# Patient Record
Sex: Female | Born: 1980 | Race: White | Hispanic: No | Marital: Married | Smoking: Never smoker
Health system: Southern US, Community
[De-identification: ages and names within clinical notes are randomized; demographics above are authoritative.]

## PROBLEM LIST (undated history)

## (undated) ENCOUNTER — Emergency Department (HOSPITAL_COMMUNITY): Admission: EM | Payer: Self-pay | Source: Home / Self Care

## (undated) DIAGNOSIS — M1990 Unspecified osteoarthritis, unspecified site: Secondary | ICD-10-CM

## (undated) DIAGNOSIS — Z98811 Dental restoration status: Secondary | ICD-10-CM

## (undated) DIAGNOSIS — G932 Benign intracranial hypertension: Secondary | ICD-10-CM

## (undated) DIAGNOSIS — C50919 Malignant neoplasm of unspecified site of unspecified female breast: Secondary | ICD-10-CM

## (undated) DIAGNOSIS — Z801 Family history of malignant neoplasm of trachea, bronchus and lung: Secondary | ICD-10-CM

## (undated) DIAGNOSIS — J45909 Unspecified asthma, uncomplicated: Secondary | ICD-10-CM

## (undated) DIAGNOSIS — R002 Palpitations: Secondary | ICD-10-CM

## (undated) DIAGNOSIS — Z8051 Family history of malignant neoplasm of kidney: Secondary | ICD-10-CM

## (undated) DIAGNOSIS — R0981 Nasal congestion: Secondary | ICD-10-CM

## (undated) DIAGNOSIS — K219 Gastro-esophageal reflux disease without esophagitis: Secondary | ICD-10-CM

## (undated) DIAGNOSIS — E669 Obesity, unspecified: Secondary | ICD-10-CM

## (undated) DIAGNOSIS — Z8709 Personal history of other diseases of the respiratory system: Secondary | ICD-10-CM

## (undated) DIAGNOSIS — J302 Other seasonal allergic rhinitis: Secondary | ICD-10-CM

## (undated) DIAGNOSIS — F419 Anxiety disorder, unspecified: Secondary | ICD-10-CM

## (undated) DIAGNOSIS — G44009 Cluster headache syndrome, unspecified, not intractable: Secondary | ICD-10-CM

## (undated) DIAGNOSIS — Z923 Personal history of irradiation: Secondary | ICD-10-CM

## (undated) DIAGNOSIS — Z8052 Family history of malignant neoplasm of bladder: Secondary | ICD-10-CM

## (undated) DIAGNOSIS — R05 Cough: Secondary | ICD-10-CM

## (undated) DIAGNOSIS — Z800 Family history of malignant neoplasm of digestive organs: Secondary | ICD-10-CM

## (undated) HISTORY — PX: WISDOM TOOTH EXTRACTION: SHX21

## (undated) HISTORY — PX: CHOLECYSTECTOMY: SHX55

## (undated) HISTORY — DX: Family history of malignant neoplasm of trachea, bronchus and lung: Z80.1

## (undated) HISTORY — DX: Gastro-esophageal reflux disease without esophagitis: K21.9

## (undated) HISTORY — DX: Anxiety disorder, unspecified: F41.9

## (undated) HISTORY — DX: Malignant neoplasm of unspecified site of unspecified female breast: C50.919

## (undated) HISTORY — DX: Benign intracranial hypertension: G93.2

## (undated) HISTORY — DX: Family history of malignant neoplasm of bladder: Z80.52

## (undated) HISTORY — DX: Family history of malignant neoplasm of kidney: Z80.51

## (undated) HISTORY — DX: Family history of malignant neoplasm of digestive organs: Z80.0

## (undated) HISTORY — DX: Obesity, unspecified: E66.9

---

## 2001-09-06 ENCOUNTER — Other Ambulatory Visit: Admission: RE | Admit: 2001-09-06 | Discharge: 2001-09-06 | Payer: Self-pay | Admitting: Obstetrics and Gynecology

## 2003-10-02 ENCOUNTER — Ambulatory Visit (HOSPITAL_COMMUNITY): Admission: AD | Admit: 2003-10-02 | Discharge: 2003-10-02 | Payer: Self-pay | Admitting: Obstetrics and Gynecology

## 2003-10-06 ENCOUNTER — Ambulatory Visit (HOSPITAL_COMMUNITY): Admission: AD | Admit: 2003-10-06 | Discharge: 2003-10-06 | Payer: Self-pay | Admitting: Obstetrics and Gynecology

## 2003-10-09 ENCOUNTER — Ambulatory Visit (HOSPITAL_COMMUNITY): Admission: RE | Admit: 2003-10-09 | Discharge: 2003-10-09 | Payer: Self-pay | Admitting: Obstetrics and Gynecology

## 2003-10-13 ENCOUNTER — Ambulatory Visit (HOSPITAL_COMMUNITY): Admission: AD | Admit: 2003-10-13 | Discharge: 2003-10-13 | Payer: Self-pay | Admitting: Obstetrics and Gynecology

## 2003-10-16 ENCOUNTER — Ambulatory Visit (HOSPITAL_COMMUNITY): Admission: AD | Admit: 2003-10-16 | Discharge: 2003-10-16 | Payer: Self-pay | Admitting: Obstetrics and Gynecology

## 2003-10-20 ENCOUNTER — Ambulatory Visit (HOSPITAL_COMMUNITY): Admission: AD | Admit: 2003-10-20 | Discharge: 2003-10-20 | Payer: Self-pay | Admitting: Obstetrics and Gynecology

## 2003-10-23 ENCOUNTER — Ambulatory Visit (HOSPITAL_COMMUNITY): Admission: AD | Admit: 2003-10-23 | Discharge: 2003-10-23 | Payer: Self-pay | Admitting: Obstetrics and Gynecology

## 2003-10-27 ENCOUNTER — Ambulatory Visit (HOSPITAL_COMMUNITY): Admission: AD | Admit: 2003-10-27 | Discharge: 2003-10-27 | Payer: Self-pay | Admitting: Obstetrics & Gynecology

## 2003-10-30 ENCOUNTER — Ambulatory Visit (HOSPITAL_COMMUNITY): Admission: AD | Admit: 2003-10-30 | Discharge: 2003-10-30 | Payer: Self-pay | Admitting: Obstetrics & Gynecology

## 2003-11-02 ENCOUNTER — Inpatient Hospital Stay (HOSPITAL_COMMUNITY): Admission: AD | Admit: 2003-11-02 | Discharge: 2003-11-06 | Payer: Self-pay | Admitting: Obstetrics and Gynecology

## 2003-11-22 ENCOUNTER — Emergency Department (HOSPITAL_COMMUNITY): Admission: EM | Admit: 2003-11-22 | Discharge: 2003-11-22 | Payer: Self-pay | Admitting: *Deleted

## 2006-05-15 ENCOUNTER — Inpatient Hospital Stay (HOSPITAL_COMMUNITY): Admission: AD | Admit: 2006-05-15 | Discharge: 2006-05-17 | Payer: Self-pay | Admitting: Obstetrics and Gynecology

## 2008-01-16 ENCOUNTER — Emergency Department (HOSPITAL_COMMUNITY): Admission: EM | Admit: 2008-01-16 | Discharge: 2008-01-16 | Payer: Self-pay | Admitting: Emergency Medicine

## 2008-02-07 ENCOUNTER — Other Ambulatory Visit: Admission: RE | Admit: 2008-02-07 | Discharge: 2008-02-07 | Payer: Self-pay | Admitting: Obstetrics & Gynecology

## 2009-03-05 ENCOUNTER — Other Ambulatory Visit: Admission: RE | Admit: 2009-03-05 | Discharge: 2009-03-05 | Payer: Self-pay | Admitting: Obstetrics & Gynecology

## 2009-10-27 ENCOUNTER — Ambulatory Visit (HOSPITAL_COMMUNITY): Admission: RE | Admit: 2009-10-27 | Discharge: 2009-10-27 | Payer: Self-pay | Admitting: Family Medicine

## 2010-04-28 ENCOUNTER — Other Ambulatory Visit: Admission: RE | Admit: 2010-04-28 | Discharge: 2010-04-28 | Payer: Self-pay | Admitting: Obstetrics & Gynecology

## 2010-12-02 NOTE — Op Note (Signed)
Breanna [name], Breanna [name]              ACCOUNT NO.:  [id number]   MEDICAL RECORD NO.:  [id number]          PATIENT TYPE:  INP   LOCATION:  A413                          FACILITY:  [facility]   PHYSICIAN:  Tilda Burrow, M.D. DATE OF BIRTH:  08-14-1980   DATE OF PROCEDURE:  05/15/2006  DATE OF DISCHARGE:                                 OPERATIVE REPORT   PREOPERATIVE DIAGNOSIS:  Pregnancy, [redacted] weeks gestation, spontaneous rupture  of membranes, prior cesarean section, operative trial of labor, abdominal  wall scarring status post wound infection at last cesarean section.   POSTOPERATIVE DIAGNOSIS:  Pregnancy, [redacted] weeks gestation, spontaneous rupture  of membranes, prior cesarean section, operative trial of labor, abdominal  wall scarring status post wound infection at last cesarean section,  delivered.   PROCEDURE:  Repeat low transverse cervical cesarean section, wide excision  of old scar.   SURGEON:  Tilda Burrow, M.D.   ASSISTANTAnnabell Howells, R.N.   ANESTHESIA:  Spinal, Nelda Severe, CRNA   COMPLICATIONS:  None.   FINDINGS:  Extensive fibrosis from the anterior subcutaneous fatty space  from her old wound infection.  Extensive fibrosis in the bladder flap area  and at the fascial layer, as well.   PROCEDURE IN DETAIL:  The patient was taken to the operating room and  prepped and draped for lower abdominal surgery.  The fetal heart tones were  documented.  A Foley catheter was placed.  The abdomen was prepped and  draped.  A Pfannenstiel incision was performed excising a 15 cm wide by 40  cm long ellipse of skin and underlying fatty tissue down to approximately 3  cm above the fascia.  The area was extremely fibrotic.  I opened the fascia  transversely in the method of Pfannenstiel being careful to dissect it off  the rectus muscles.  The midline was difficult to identify due to fibrosis  over the muscles but we entered quite high and were able to stay out of the  bladder.  The bladder  was extremely adherent to the anterior abdominal wall  and high on the lower uterine segment.  With care, we were able to dissect  it off the overlying rectus muscles and expose enough of the lower uterine  segment to do a transverse uterine incision at the site of a prior thinned  out area of the lower uterine segment.  This was extended laterally using  index finger traction.  The vacuum extractor was applied to the vertex and  used to guide the vertex while fundal pressure was applied.  The infant  delivered easily, a 9 pound 1 ounce female infant, Apgars 8 and 9, cared for  by Dr. Milford Cage, see his notes elsewhere.   The cord blood samples were obtained, placenta delivered easily.  The ring  retractor was placed in position to allow visualization of the lower uterine  segment and we were easily able to close the uterine incision.  There was  some vascularity in the fibrotic area where the bladder had been removed and  we had to put 3-4 mattress sutures of 2-0 chromic  to achieve adequate  hemostasis.  The bladder flap could not be reperitonealized over the  incision except for a couple of approximating sutures of 2-0 chromic.  The  anterior peritoneum was closed with a running 2-0 chromic.  The rectus  muscles were pulled in the midline with interrupted 2-0 chromic sutures x3.  The fascia was then inspected.  The fibrosis to the overlying fatty tissue  fibrosis was addressed by excising the subcutaneous fatty fibrosis and  trimming the most fibrous portion of the fascial incision, taking off a  strip of about 1-2 cm in the midline and tapering it to the sides resulting  in more normal appearing tissue.  This fascia was closed in a continuous  running fashion with good surgical and cosmetic results.  The subcutaneous  fatty tissues were then mobilized at the Scarpa's fascial level enough to  optimize skin edge reapproximation.  The subcutaneous fatty tissues were  pulled together with  interrupted 2-0 plain sutures.  A flat JP drain was  placed above the fascia and allowed to exit through the left corner of the  incision where it was sutured in place.  The stapled closure of the skin  completed the procedure and the wound irrigated multiple times with  antibiotic containing solution throughout the case and IV antibiotics used,  as well.  The patient went to the recovery room in stable condition.  Estimated blood loss 600 mL.      Tilda Burrow, M.D.  Electronically Signed     JVF/MEDQ  D:  05/15/2006  T:  05/15/2006  Job:  [id number]   cc:   Francoise Schaumann. Milford Cage DO, FAAP  Fax: [phone]

## 2010-12-02 NOTE — Discharge Summary (Signed)
Breanna [name], Breanna [name]              ACCOUNT NO.:  [id number]   MEDICAL RECORD NO.:  [id number]          PATIENT TYPE:  INP   LOCATION:  A413                          FACILITY:  [facility]   PHYSICIAN:  Tilda Burrow, M.D. DATE OF BIRTH:  [birth year]   DATE OF ADMISSION:  05/15/2006  DATE OF DISCHARGE:  11/01/2007LH                                 DISCHARGE SUMMARY   ADMITTING DIAGNOSES:  1. A 38 weeks 3 days spontaneous rupture of membranes, early labor.      Repeat cesarean section after a trial of labor.  2. Obesity with a previously poorly healed surgical scar.  3. Possible latex allergy.   DIAGNOSES:  1. A 38 weeks 3 days spontaneous rupture of membranes, early labor.      Repeat cesarean section after a trial of labor, delivered.  2. Obesity with a previously poorly healed surgical scar.  3. Possible latex allergy.   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   DISCHARGE MEDICATIONS:  Tylox 1 q. 4 h. p.r.n. pain.   Followup in 6 days for an incision check and seek Jackson-Pratt drain  removal and removal of half of staples.   HOSPITAL SUMMARY:  This is a 30 year old female, gravida 3, para 1, AB 1 was  admitted as described in the HPI with 46 cm fundal height, term fetus for  repeat cesarean section.  The patient was actually scheduled for November 1  cesarean section but presented with spontaneous rupture of membranes on  May 15, 2006.   HOSPITAL COURSE:  The patient underwent a repeat cesarean section on May 15, 2006, at approximately 8:30 a.m.  Wide excision of the cicatrix was  performed as well with a 600 mL BL.  The findings included extensive  scarring of the bladder and the bladder was found behind on the lower  uterine segment making access to the lower uterine segment technically  challenging.  Additionally, she had a very edematous panniculus above the  old C-section scar, felt attributable to the old  fibrosis from her infected  C-section scar from her first section.   The C-section involved removing of a generous portion of this abdominal old  scar and the fibrosis above in the fatty area.  We also removed some of the  fibrosis in the fascial layer as well.   Postoperatively, the patient did excellently, afebrile, went home on the  second postoperative day with the Jackson-Pratt in place draining clear,  serous fluid.  This postop hemoglobin was 13.0 compared to 13.9 on  admission.  White count was unchanged at 12,800.  Follow up in 6 days or  earlier p.r.n. problems.  Instruction on drainage management given with the  patient and husband.      Tilda Burrow, M.D.  Electronically Signed     JVF/MEDQ  D:  05/17/2006  T:  05/17/2006  Job:  [id number]   cc:   [facility]   Francoise Schaumann. Milford Cage DO, FAAP  Fax: [phone]

## 2010-12-02 NOTE — H&P (Signed)
Breanna [name], Breanna [name]              ACCOUNT NO.:  [id number]   MEDICAL RECORD NO.:  [id number]          PATIENT TYPE:  INP   LOCATION:  A413                          FACILITY:  [facility]   PHYSICIAN:  Tilda Burrow, M.D. DATE OF BIRTH:  [birth year]   DATE OF ADMISSION:  05/15/2006  DATE OF DISCHARGE:  LH                                HISTORY & PHYSICAL   ADMISSION DIAGNOSES:  1. Pregnancy at 38 weeks 3 days.  2. Spontaneous rupture of membranes with early labor.  3. Repeat cesarean section, not for trial of labor.   HISTORY OF PRESENT ILLNESS:  This 30 year old female, gravida 3, para 1, AB  1, with ultrasound-assigned EDC of May 26, 2006, based on 6-week  ultrasound, with EDCs of October 31 and October 28 based on 22 and 29-week  ultrasounds, is admitted after a pregnancy followed through our office.  She  is scheduled for repeat cesarean section.  She has a history of a C-section  performed at term after prolonged labor, which had the complication of a  postoperative cellulitis in the incision with subcu analgesic drain placed,  subsequently developing a seroma that became infected with Staph infection.  Delayed closure was necessary.  Additionally, she gives an equivocal history  of LATEX allergy based on the fact that she gets mouth sores after going to  the dentist and this only happens when he uses LATEX gloves.   SURGICAL HISTORY:  1. Cesarean section.  2. Cholecystectomy.  3. Wisdom tooth extraction.  4. Eye surgery.   No known drug allergies.   MEDICATIONS:  Prenatal vitamins.   SOCIAL HISTORY:  Married.  Stable, supportive partner.   FAMILY HISTORY:  Positive for hypertension and diabetes.   PAST MEDICAL HISTORY:  Positive for seasonal allergies and morbid obesity.   PHYSICAL EXAMINATION:  VITAL SIGNS:  Height 5 feet 6 inches, weight 266,  which is a 42-pound weight gain this pregnancy.  Blood pressure 144/84.  HEENT:  Pupils equal, round and reactive.   Extraocular movements intact.  NECK:  Supple.  CHEST:  Clear to auscultation.  ABDOMEN:  Nontender.  Fundal height 46 cm with well-healed surgical scar  with marked edema in the area just above the scar.  PELVIC:  Presenting part does not enter the pelvis well.  Cervix by nursing  was 3 cm and high presenting part.   Contractions are knocked out.   PLAN:  Terbutaline tocolysis.  Repeat cesarean section 8:30 a.m. on October  _31_, 2007.   ADDENDUM:  Membranes grossly ruptured with Nitrazine-positive internal exam  and identification of perineal pooling performed on admission.      Tilda Burrow, M.D.  Electronically Signed     JVF/MEDQ  D:  05/15/2006  T:  05/16/2006  Job:  [id number]   cc:   Francoise Schaumann. Milford Cage DO, FAAP  Fax: [phone]

## 2010-12-02 NOTE — Op Note (Signed)
Breanna [name], Breanna [name]                        ACCOUNT NO.:  [id number]   MEDICAL RECORD NO.:  [id number]                   PATIENT TYPE:  INP   LOCATION:  A407                                 FACILITY:  [facility]   PHYSICIAN:  Lazaro Arms, M.D.                DATE OF BIRTH:  Aug 01, 1980   DATE OF PROCEDURE:  11/03/2003  DATE OF DISCHARGE:                                 OPERATIVE REPORT   PREOPERATIVE DIAGNOSES:  1. Intrauterine pregnancy at [redacted] weeks gestation.  2. Secondary arrest of dilatation and descent in the second stage of labor.  3. Polyhydramnios.  4. Suspected fetal macrosomia.   POSTOPERATIVE DIAGNOSES:  1. Intrauterine pregnancy at [redacted] weeks gestation.  2. Secondary arrest of dilatation and descent in the second stage of labor.  3. Polyhydramnios.  4. Suspected fetal macrosomia.   PROCEDURE:  Primary low transverse cesarean section.   SURGEON:  Lazaro Arms, M.D.   ANESTHESIA:  Epidural.  Catheter placed by Dr. Despina Hidden and dosed up.   ESTIMATED BLOOD LOSS:  500 cc.   SPECIMENS:  None.   FINDINGS:  Over a low transverse hysterotomy incision was delivered a viable  female infant at 2151 with Apgars of 9 and 9 and weighing 8 pounds 5.6 ounces.  There was a three-vessel cord.  Cord blood was sent, cord gas was sent.  The  placenta was normal and intact.  The uterus was normal.  The tubes and  ovaries were also normal.   DESCRIPTION OF PROCEDURE:  The patient was taken to the operating room and  placed in the supine position.  Her epidural was dosed up to an adequate  anesthetic level for a cesarean section.  She was prepped and draped in the  usual sterile fashion.   A Pfannenstiel skin incision was made and carried down sharply to the rectus  fascia, scored in the midline, and extended laterally.  The fascia was taken  off of the muscles superiorly and inferiorly without difficulty.  The  muscles were divided, and the peritoneal cavity was entered.  The bladder  blade was placed.  The vesicouterine serosal flap was created.  Over this, a  low transverse hysterotomy incision was made and delivered a viable female at  2151 with Apgars of 9 and 9 and weighing 8 pounds 5.6 ounces.  Three-vessel  cord.  Cord blood and cord gas sent.  The infant was handed to Dr. Milford Cage who  was in attendance for routine neonatal resuscitation.  The placenta was  delivered spontaneously.  The uterus was exteriorized and closed in two  layers, the first being a running interlocking layer and the second an  imbricating layer.  There was good hemostasis.  The uterus was replaced in  the peritoneal cavity.  Both pericolic gutters were irrigated.  There was  good hemostasis once again confirmed.  The muscles and peritoneum were  reapproximated loosely.  The  fascia was closed using 0 Vicryl running.  The  subcutaneous tissue was made hemostatic and irrigated.  A subcutaneous pain  pump was placed for postoperative pain management.  The skin was closed with  skin staples.  A pressure dressing was applied.   The patient tolerated the procedure well.  She experienced 500 cc of blood  loss and was taken to the recovery room in good and stable condition.  All  counts were correct x3.      ___________________________________________                                            Lazaro Arms, M.D.   LHE/MEDQ  D:  11/03/2003  T:  11/04/2003  Job:  [id number]

## 2010-12-02 NOTE — H&P (Signed)
Breanna [name], Breanna [name]                        ACCOUNT NO.:  [id number]   MEDICAL RECORD NO.:  [id number]                  PATIENT TYPE:  OIB   LOCATION:  A415                                 FACILITY:  [facility]   PHYSICIAN:  Lazaro Arms, M.D.                DATE OF BIRTH:  11-22-1980   DATE OF ADMISSION:  11/02/2003  DATE OF DISCHARGE:                                HISTORY & PHYSICAL   HISTORY OF PRESENT ILLNESS:  Breanna [name] is a 30 year old white female, gravida  2, para 0, abortus 1, with estimated date of delivery of Nov 16, 2003,  currently at 69 weeks' gestation.  The patient's pregnancy has been  complicated with polyhydramnios and large estimated fetal weight of 90th  percentile.  On October 08, 2003, the estimated fetal weight was 3438 g. The  AFI has been 27.  The anatomical survey has been normal.  I have noted  no  reason for the polyhydramnios she has had twice weekly and STs, all of which  have been reassuring.  She is admitted for induction with a favorable  cervix, both for the polyhydramnios and the probable fetal macrosomia,  estimated fetal weight of 4000 g.   The patient and her husband are very desirous of a vaginal delivery and  aggressive vaginal trial.  The patient understands concern about having an  SVD.   PAST MEDICAL HISTORY:  Negative.   PAST SURGICAL HISTORY:  Cholecystectomy.   PAST OB HISTORY:  She had a miscarriage in March of 2004.   ALLERGIES:  SULFA DRUGS.   MEDICATIONS:  Prenatal vitamins.   REVIEW OF SYSTEMS:  Negative.   LABORATORY DATA:  Blood type is A positive.  Antibody is negative.  Hepatitis B is negative.  HIV is negative.  Serology is nonreactive.  Pap  was normal.  GC and Chlamydia both negative.  The patient declined AFT.  Group B Strep was positive.  One hour GTT was elevated at 150 but her three  hour was normal.   PHYSICAL EXAMINATION:  HEENT:  Unremarkable.  NECK:  Thyroid is normal.  LUNGS:  Clear.  BREASTS:  Deferred.  HEART:  Regular rate and rhythm without murmur, regurge, or gallop.  ABDOMEN:  Fundal height is 45 cm.  PELVIC:  Cervix 250, -2.  EXTREMITIES:  Warm, no edema.  NEUROLOGIC:  Grossly intact.   IMPRESSION:  1/  Intrauterine pregnancy, 38 weeks' gestation.  2/  Polyhydramnios, idiopathic.  1. Fetal macrosomia.   PLAN:  The patient is admitted for cervical ripening and induction.  The  patient understands the high likelihood of a cesarean section with estimated  fetal weight greater than 4000 g.  She will proceed with Foley __________and  induction of labor.     ___________________________________________  Lazaro Arms, M.D.   [name]  D:  11/03/2003  T:  11/03/2003  Job:  [id number]

## 2010-12-02 NOTE — Op Note (Signed)
[name], Breanna                          ACCOUNT NO.:  [id number]   MEDICAL RECORD NO.:  [id number]                  PATIENT TYPE:   LOCATION:                                       FACILITY:   PHYSICIAN:  Lazaro Arms, M.D.                DATE OF BIRTH:   DATE OF PROCEDURE:  11/03/2003  DATE OF DISCHARGE:                                 OPERATIVE REPORT   PROCEDURE:  Epidural placement.   INDICATIONS FOR PROCEDURE:  Breanna [name] is a 30 year old white female gravida 2,  para 0, abortus 1 being induced because of estimated fetal weight of 4000 g  and polyhydramnios.  She is having regular contractions.  She has had an  amniotomy and is requesting epidural placement.   DESCRIPTION OF PROCEDURE:  The patient was placed in the sitting position.  Betadine prep is used.  The L2-L3 interspace is identified.  Field drape  placed.  Lidocaine 1% is injected as a local anesthetic.  A 17-gauge Tuohy  needle is used and a loss of resistance technique employed.  The epidural  space is found with one pass without difficulty.  Use of loss of resistance  technique.  Bupivacaine 0.125%, 10 cc, is given as a test dose without ill  effect.  An additional 10 cc was given to dose up the epidural after the  catheter has been placed 5 cm into the epidural space.  The epidural is  taped down, and the continuous pump is placed at 12 cc an hour.  The patient  tolerated the procedure well.  Fetal heart rate tracing is reassuring.  Blood pressure is stable.      ___________________________________________                                            Lazaro Arms, M.D.   LHE/MEDQ  D:  11/24/2003  T:  11/24/2003  Job:  [id number]

## 2010-12-02 NOTE — H&P (Signed)
Breanna [name], Breanna [name]              ACCOUNT NO.:  [id number]   MEDICAL RECORD NO.:  [id number]          PATIENT TYPE:  INP   LOCATION:  A413                          FACILITY:  [facility]   PHYSICIAN:  Tilda Burrow, M.D. DATE OF BIRTH:  1980-08-22   DATE OF ADMISSION:  DATE OF DISCHARGE:  LH                                HISTORY & PHYSICAL   ADMISSION DIAGNOSIS:  1. Pregnancy 38-1/2 weeks' gestation.  2. Repeat cesarean section.  3. Not for trial of labor.  4. Suspected fetal macrosomia.  5. Possible latex allergy.  6. Obesity with abdominal wall laxity.   HISTORY OF PRESENT ILLNESS:  This 30 year old female, gravida 3, para 1, AB  1, due 05/26/2006 is admitted at 38-1/2 weeks' gestation for repeat cesarean  section and wide excision of the old cicatrix.  She has been seen in our  office and her extensive list of questions answered including questions  regarding latex allergy, how we will feed the baby, etc.  She has been  encouraged to bring a list of her preferences to day surgery for our review.   OB history is notable for a poorly healed prior C-section scar with  cellulitis developed after C-section despite a subcutaneous analgesic drain  and subsequent seroma after the C-section.  This developed staph infection  and had delayed closure. She also has a QUESTIONABLE HISTORY OF LATEX  ALLERGY.   PAST MEDICAL HISTORY:  Notable for seasonal allergies.   SURGERIES:  1. Cesarean section.  2. Cholecystectomy.  3. Wisdom teeth extraction.   Cigarettes, alcohol, recreational drugs denied.   ALLERGIES:  __________.   GENERAL:  Shows a large framed, stocky, Caucasian female, height 5 feet 8  inches, weight 266.  Blood pressure 144/84.  Pupils equal, round and reactive.  NECK:  Supple, trachea midline.  CHEST:  Clear to auscultation.  ABDOMEN:  Nontender.  EXTERNAL GENITALIA:  Normal.   PRENATAL LABS:  Blood type A positive.  Rubella immunity present.  Hemoglobin 15,  hematocrit 42, hepatitis, HIV, RPR, GC, chlamydia all  negative.  MSAFP was declined.  Glucose tolerance is 110 mg%.   PLAN:  Repeat cesarean section with wide excision of cicatrix 05/17/2006.  This is the 3rd case of the day.      Tilda Burrow, M.D.  Electronically Signed     JVF/MEDQ  D:  05/14/2006  T:  05/15/2006  Job:  [id number]

## 2011-04-13 LAB — CBC
MCHC: 34.4
MCV: 94.5
Platelets: 248
RBC: 4.7

## 2011-04-13 LAB — BASIC METABOLIC PANEL
Chloride: 108
GFR calc Af Amer: >60
Glucose, Bld: 116 — ABNORMAL HIGH
Potassium: 4
Sodium: 140

## 2011-04-13 LAB — POCT CARDIAC MARKERS
CKMB, poc: <1 — ABNORMAL LOW
Troponin i, poc: <0.05

## 2011-04-13 LAB — DIFFERENTIAL
Basophils Relative: 1
Lymphs Abs: 1.9
Monocytes Relative: 6
Neutro Abs: 7.9 — ABNORMAL HIGH

## 2012-06-11 ENCOUNTER — Other Ambulatory Visit (HOSPITAL_COMMUNITY)
Admission: RE | Admit: 2012-06-11 | Discharge: 2012-06-11 | Disposition: A | Discharge status: Home / Self Care | Payer: 59 | Source: Ambulatory Visit | Attending: Obstetrics and Gynecology | Admitting: Obstetrics and Gynecology

## 2012-06-11 ENCOUNTER — Other Ambulatory Visit: Payer: Self-pay | Admitting: Adult Health

## 2012-06-11 DIAGNOSIS — Z01419 Encounter for gynecological examination (general) (routine) without abnormal findings: Secondary | ICD-10-CM | POA: Insufficient documentation

## 2012-06-11 DIAGNOSIS — Z1151 Encounter for screening for human papillomavirus (HPV): Secondary | ICD-10-CM | POA: Insufficient documentation

## 2013-08-22 ENCOUNTER — Ambulatory Visit (INDEPENDENT_AMBULATORY_CARE_PROVIDER_SITE_OTHER): Payer: 59 | Admitting: Adult Health

## 2013-08-22 ENCOUNTER — Encounter: Payer: Self-pay | Admitting: Adult Health

## 2013-08-22 ENCOUNTER — Encounter (INDEPENDENT_AMBULATORY_CARE_PROVIDER_SITE_OTHER): Payer: Self-pay

## 2013-08-22 VITALS — BP 128/88 | HR 78 | Ht 65.0 in | Wt 249.0 lb

## 2013-08-22 DIAGNOSIS — Z01419 Encounter for gynecological examination (general) (routine) without abnormal findings: Secondary | ICD-10-CM

## 2013-08-22 DIAGNOSIS — G932 Benign intracranial hypertension: Secondary | ICD-10-CM

## 2013-08-22 DIAGNOSIS — E669 Obesity, unspecified: Secondary | ICD-10-CM | POA: Insufficient documentation

## 2013-08-22 LAB — LIPID PANEL
Cholesterol: 156 mg/dL (ref 0–200)
HDL: 49 mg/dL
LDL Cholesterol: 92 mg/dL (ref 0–99)
Total CHOL/HDL Ratio: 3.2 ratio
Triglycerides: 77 mg/dL
VLDL: 15 mg/dL (ref 0–40)

## 2013-08-22 LAB — CBC
HEMATOCRIT: 45.7 % (ref 36.0–46.0)
HEMOGLOBIN: 15.9 g/dL — AB (ref 12.0–15.0)
MCH: 32.2 pg (ref 26.0–34.0)
MCHC: 34.8 g/dL (ref 30.0–36.0)
MCV: 92.5 fL (ref 78.0–100.0)
Platelets: 276 10*3/uL (ref 150–400)
RBC: 4.94 MIL/uL (ref 3.87–5.11)
RDW: 13.6 % (ref 11.5–15.5)
WBC: 8.5 10*3/uL (ref 4.0–10.5)

## 2013-08-22 LAB — COMPREHENSIVE METABOLIC PANEL
ALT: 16 U/L (ref 0–35)
AST: 16 U/L (ref 0–37)
Albumin: 4.2 g/dL (ref 3.5–5.2)
Alkaline Phosphatase: 58 U/L (ref 39–117)
BUN: 10 mg/dL (ref 6–23)
CALCIUM: 9.1 mg/dL (ref 8.4–10.5)
CHLORIDE: 108 meq/L (ref 96–112)
CO2: 21 mEq/L (ref 19–32)
Creat: 1.08 mg/dL (ref 0.50–1.10)
GLUCOSE: 94 mg/dL (ref 70–99)
Potassium: 4 mEq/L (ref 3.5–5.3)
Sodium: 139 mEq/L (ref 135–145)
TOTAL PROTEIN: 6.9 g/dL (ref 6.0–8.3)
Total Bilirubin: 0.8 mg/dL (ref 0.2–1.2)

## 2013-08-22 LAB — TSH: TSH: 1.061 u[IU]/mL (ref 0.350–4.500)

## 2013-08-22 NOTE — Progress Notes (Signed)
Patient ID: Breanna [name], female   DOB: Jan 13, 1981, 33 y.o.   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 Got flu shot in fall.  Current Medications, Allergies, Past Medical History, Past Surgical History, Family History and Social History were reviewed in [employer] record.     Review of Systems: Patient denies any headaches, blurred vision, shortness of breath, chest pain, abdominal pain, problems with bowel movements, urination, or intercourse. No joint pain or mood swings. She was retaining fluid and was started on diamox for pseudotumor cerebri syndrome.   Physical Exam:BP 128/88  Pulse 78  Ht 5\' 5"  (1.651 m)  Wt 249 lb (112.946 kg)  BMI 41.44 kg/m2 General:  Well developed, well nourished, no acute distress Skin:  Warm and dry Neck:  Midline trachea, normal thyroid Lungs; Clear to auscultation bilaterally Breast:  No dominant palpable mass, retraction, or nipple discharge Cardiovascular: Regular rate and rhythm Abdomen:  Soft, non tender, no hepatosplenomegaly,obese Pelvic:  External genitalia is normal in appearance.  The vagina is normal in appearance. The cervix is bulbous.  Uterus is felt to be normal size, shape, and contour.  No adnexal masses or tenderness noted. Extremities:  No swelling or varicosities noted Psych:  No mood changes, alert and cooperative, seems happy Discussed diet changes, she is not there yet.  Impression: Yearly gyn exam no pap Pseudotumor cerebri syndrome Obesity   Plan: Physical in 1 year with pap then Mammogram at 39  Check CBC,CMP,TSH and lipids Review handout on weight loss

## 2013-08-22 NOTE — Patient Instructions (Signed)
Physical in 1 year Mammogram at 39  Calorie Counting Diet A calorie counting diet requires you to eat the number of calories that are right for you in a day. Calories are the measurement of how much energy you get from the food you eat. Eating the right amount of calories is important for staying at a healthy weight. If you eat too many calories, your body will store them as fat and you may gain weight. If you eat too few calories, you may lose weight. Counting the number of calories you eat during a day will help you know if you are eating the right amount. A Registered Dietitian can determine how many calories you need in a day. The amount of calories needed varies from person to person. If your goal is to lose weight, you will need to eat fewer calories. Losing weight can benefit you if you are overweight or have health problems such as heart disease, high blood pressure, or diabetes. If your goal is to gain weight, you will need to eat more calories. Gaining weight may be necessary if you have a certain health problem that causes your body to need more energy. TIPS Whether you are increasing or decreasing the number of calories you eat during a day, it may be hard to get used to changes in what you eat and drink. The following are tips to help you keep track of the number of calories you eat.  Measure foods at home with measuring cups. This helps you know the amount of food and number of calories you are eating.  Restaurants often serve food in amounts that are larger than 1 serving. While eating out, estimate how many servings of a food you are given. For example, a serving of cooked rice is  cup or about the size of half of a fist. Knowing serving sizes will help you be aware of how much food you are eating at restaurants.  Ask for smaller portion sizes or child-size portions at restaurants.  Plan to eat half of a meal at a restaurant. Take the rest home or share the other half with a  friend.  Read the Nutrition Facts panel on food labels for calorie content and serving size. You can find out how many servings are in a package, the size of a serving, and the number of calories each serving has.  For example, a package might contain 3 cookies. The Nutrition Facts panel on that package says that 1 serving is 1 cookie. Below that, it will say there are 3 servings in the container. The calories section of the Nutrition Facts label says there are 90 calories. This means there are 90 calories in 1 cookie (1 serving). If you eat 1 cookie you have eaten 90 calories. If you eat all 3 cookies, you have eaten 270 calories (3 servings x 90 calories = 270 calories). The list below tells you how big or small some common portion sizes are.  1 oz.........4 stacked dice.  3 oz........Marland KitchenDeck of cards.  1 tsp.......Marland KitchenTip of little finger.  1 tbs......Marland KitchenMarland KitchenThumb.  2 tbs.......Marland KitchenGolf ball.   cup......Marland KitchenHalf of a fist.  1 cup.......Marland KitchenA fist. KEEP A FOOD LOG Write down every food item you eat, the amount you eat, and the number of calories in each food you eat during the day. At the end of the day, you can add up the total number of calories you have eaten. It may help to keep a list like the one below.  Find out the calorie information by reading the Nutrition Facts panel on food labels. Breakfast  Bran cereal (1 cup, 110 calories).  Fat-free milk ( cup, 45 calories). Snack  Apple (1 medium, 80 calories). Lunch  Spinach (1 cup, 20 calories).  Tomato ( medium, 20 calories).  Chicken breast strips (3 oz, 165 calories).  Shredded cheddar cheese ( cup, 110 calories).  Light [place] dressing (2 tbs, 60 calories).  Whole-wheat bread (1 slice, 80 calories).  Tub margarine (1 tsp, 35 calories).  Vegetable soup (1 cup, 160 calories). Dinner  Pork chop (3 oz, 190 calories).  Brown rice (1 cup, 215 calories).  Steamed broccoli ( cup, 20 calories).  Strawberries (1  cup, 65  calories).  Whipped cream (1 tbs, 50 calories). Daily Calorie Total: 7989 Document Released: 07/03/2005 Document Revised: 09/25/2011 Document Reviewed: 12/28/2006 Exit[employer] Patient Information 2014 [place].  call for labs next week

## 2013-08-25 ENCOUNTER — Telehealth: Payer: Self-pay | Admitting: Adult Health

## 2013-08-25 NOTE — Telephone Encounter (Signed)
Pt aware of labs  

## 2013-09-01 ENCOUNTER — Telehealth: Payer: Self-pay | Admitting: *Deleted

## 2013-09-01 NOTE — Telephone Encounter (Signed)
Pt aware of results and a copy will be mailed to the pt per JAG.

## 2014-05-18 ENCOUNTER — Encounter: Payer: Self-pay | Admitting: Adult Health

## 2014-05-21 ENCOUNTER — Encounter: Payer: Self-pay | Admitting: Adult Health

## 2014-05-21 ENCOUNTER — Ambulatory Visit (INDEPENDENT_AMBULATORY_CARE_PROVIDER_SITE_OTHER): Payer: 59 | Admitting: Adult Health

## 2014-05-21 VITALS — BP 120/74 | Ht 65.0 in | Wt 253.0 lb

## 2014-05-21 DIAGNOSIS — N63 Unspecified lump in unspecified breast: Secondary | ICD-10-CM

## 2014-05-21 DIAGNOSIS — N644 Mastodynia: Secondary | ICD-10-CM

## 2014-05-21 DIAGNOSIS — F419 Anxiety disorder, unspecified: Secondary | ICD-10-CM | POA: Insufficient documentation

## 2014-05-21 MED ORDER — BUSPIRONE HCL 7.5 MG PO TABS: ORAL_TABLET | ORAL | Status: DC

## 2014-05-21 NOTE — Patient Instructions (Addendum)
Generalized Anxiety Disorder Generalized anxiety disorder (GAD) is a mental disorder. It interferes with life functions, including relationships, work, and school. GAD is different from normal anxiety, which everyone experiences at some point in their lives in response to specific life events and activities. Normal anxiety actually helps [place] prepare for and get through these life events and activities. Normal anxiety goes away after the event or activity is over.  GAD causes anxiety that is not necessarily related to specific events or activities. It also causes excess anxiety in proportion to specific events or activities. The anxiety associated with GAD is also difficult to control. GAD can vary from mild to severe. People with severe GAD can have intense waves of anxiety with physical symptoms (panic attacks).  SYMPTOMS The anxiety and worry associated with GAD are difficult to control. This anxiety and worry are related to many life events and activities and also occur more days than not for 6 months or longer. People with GAD also have three or more of the following symptoms (one or more in children):  Restlessness.   Fatigue.  Difficulty concentrating.   Irritability.  Muscle tension.  Difficulty sleeping or unsatisfying sleep. DIAGNOSIS GAD is diagnosed through an assessment by your health care provider. Your health care provider will ask you questions aboutyour mood,physical symptoms, and events in your life. Your health care provider may ask you about your medical history and use of alcohol or drugs, including prescription medicines. Your health care provider may also do a physical exam and blood tests. Certain medical conditions and the use of certain substances can cause symptoms similar to those associated with GAD. Your health care provider may refer you to a mental health specialist for further evaluation. TREATMENT The following therapies are usually used to treat GAD:    Medication. Antidepressant medication usually is prescribed for long-term daily control. Antianxiety medicines may be added in severe cases, especially when panic attacks occur.   Talk therapy (psychotherapy). Certain types of talk therapy can be helpful in treating GAD by providing support, education, and guidance. A form of talk therapy called cognitive behavioral therapy can teach you healthy ways to think about and react to daily life events and activities.  Stress managementtechniques. These include yoga, meditation, and exercise and can be very helpful when they are practiced regularly. A mental health specialist can help determine which treatment is best for you. Some people see improvement with one therapy. However, other people require a combination of therapies. Document Released: 10/28/2012 Document Revised: 11/17/2013 Document Reviewed: 10/28/2012 [facility] Patient Information 2015 [place], [place]. This information is not intended to replace advice given to you by your health care provider. Make sure you discuss any questions you have with your health care provider. Breast Cyst A breast cyst is a sac in the breast that is filled with fluid. Breast cysts are common in women. Women can have one or many cysts. When the breasts contain many cysts, it is usually due to a noncancerous (benign) condition called fibrocystic change. These lumps form under the influence of female hormones (estrogen and progesterone). The lumps are most often located in the upper, outer portion of the breast. They are often more swollen, painful, and tender before your period starts. They usually disappear after menopause, unless you are on hormone therapy.  There are several types of cysts:  Macrocyst. This is a cyst that is about 2 in. (5.1 cm) in diameter.   Microcyst. This is a tiny cyst that you cannot feel  but can be seen with a mammogram or an ultrasound.   Galactocele. This is a cyst containing milk  that may develop if you suddenly stop breastfeeding.   Sebaceous cyst of the skin. This type of cyst is not in the breast tissue itself. Breast cysts do not increase your risk of breast cancer. However, they must be monitored closely because they can be cancerous.  CAUSES  It is not known exactly what causes a breast cyst to form. Possible causes include:  An overgrowth of milk glands and connective tissue in the breast can block the milk glands, causing them to fill with fluid.   Scar tissue in the breast from previous surgery may block the glands, causing a cyst.  RISK FACTORS Estrogen may influence the development of a breast cyst.  SIGNS AND SYMPTOMS   Feeling a smooth, round, soft lump (like a grape) in the breast that is easily moveable.   Breast discomfort or pain.  Increase in size of the lump before your menstrual period and decrease in its size after your menstrual period.  DIAGNOSIS  A cyst can be felt during a physical exam by your health care provider. A breast X-ray exam (mammogram) and ultrasonography will be done to confirm the diagnosis. Fluid may be removed from the cyst with a needle (fine needle aspiration) to make sure the cyst is not cancerous.  TREATMENT  Treatment may not be necessary. Your health care provider may monitor the cyst to see if it goes away on its own. If treatment is needed, it may include:  Hormone treatment.   Needle aspiration. There is a chance of the cyst coming back after aspiration.   Surgery to remove the whole cyst.  HOME CARE INSTRUCTIONS   Keep all follow-up appointments with your health care provider.  See your health care provider regularly:  Get a yearly exam by your health care provider.  Have a clinical breast exam by a health care provider every 1-3 years if you are 6-38 years of age. After age 12 years, you should have the exam every year.   Get mammogram tests as directed by your health care provider.    Understand the normal appearance and feel of your breasts and perform breast self-exams.   Only take over-the-counter or prescription medicines as directed by your health care provider.   Wear a supportive bra, especially when exercising.   Avoid caffeine.   Reduce your salt intake, especially before your menstrual period. Too much salt can cause fluid retention, breast swelling, and discomfort.  SEEK MEDICAL CARE IF:   You feel, or think you feel, a lump in your breast.   You notice that both breasts look or feel different than usual.   Your breast is still causing pain after your menstrual period is over.   You need medicine for breast pain and swelling that occurs with your menstrual period.  SEEK IMMEDIATE MEDICAL CARE IF:   You have severe pain, tenderness, redness, or warmth in your breast.   You have nipple discharge or bleeding.   Your breast lump becomes hard and painful.   You find new lumps or bumps that were not there before.   You feel lumps in your armpit (axilla).   You notice dimpling or wrinkling of the breast or nipple.   You have a fever.  MAKE SURE YOU:  Understand these instructions.  Will watch your condition.  Will get help right away if you are not doing well or  get worse. Document Released: 07/03/2005 Document Revised: 03/05/2013 Document Reviewed: 01/30/2013 [facility] Patient Information 2015 [place], [place]. This information is not intended to replace advice given to you by your health care provider. Make sure you discuss any questions you have with your health care provider. Take buspar 1 bid Get mammogram 11/24 at 1:45 at [facility] Decrease caffeine

## 2014-05-21 NOTE — Progress Notes (Signed)
Subjective:     Patient ID: Breanna [name], female   DOB: [birth year], 33 y.o.   MRN: [id number]  HPI Breanna [name] is a 33 year old white female, married, in complaining of pain,tingling in both breasts R>L for a few weeks. 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  Review of Systems See HPI Reviewed past medical,surgical, social and family history. Reviewed medications and allergies.     Objective:   Physical Exam BP 120/74 mmHg  Ht 5\' 5"  (1.651 m)  Wt 253 lb (114.76 kg)  BMI 42.10 kg/m2    Skin warm and dry,  Breasts:no dominate palpable mass, retraction or nipple discharge on the left has irregularities, on the right, no retraction or nipple discharge but has a tender nodule at 7 and 10 0'clock, and has irregularities.Discussed getting mammogram and trying medication and she agrees. Tried celexa in past and didn't like it.   Assessment:     Pain in both breasts Right breast nodules Anxiety     Plan:    Take time for self, and try buspar, can use advil if headaches continue see Breanna [name], in follow up Diagnostic bilateral mammogram and right breast US 11/24 at 1:45 pm at [facility] Rx buspar 7.5 mg #60 1 bid with 3 refills Follow up in 6 weeks for ROS   Review handout on breast cyst and anxiety

## 2014-06-09 ENCOUNTER — Ambulatory Visit (HOSPITAL_COMMUNITY)
Admission: RE | Admit: 2014-06-09 | Discharge: 2014-06-09 | Disposition: A | Discharge status: Home / Self Care | Payer: 59 | Source: Ambulatory Visit | Attending: Adult Health | Admitting: Adult Health

## 2014-06-09 DIAGNOSIS — N63 Unspecified lump in unspecified breast: Secondary | ICD-10-CM

## 2014-06-09 DIAGNOSIS — N644 Mastodynia: Secondary | ICD-10-CM | POA: Insufficient documentation

## 2014-06-30 ENCOUNTER — Telehealth: Payer: Self-pay | Admitting: Adult Health

## 2014-06-30 NOTE — Telephone Encounter (Signed)
Pt cancelled appt for tomorrow due to work schedule, stopped buspar felt sleepy, Pt aware of mammogram and [place].

## 2014-07-02 ENCOUNTER — Ambulatory Visit: Payer: 59 | Admitting: Adult Health

## 2015-01-26 ENCOUNTER — Ambulatory Visit (INDEPENDENT_AMBULATORY_CARE_PROVIDER_SITE_OTHER): Payer: Commercial Managed Care - HMO | Admitting: Diagnostic Neuroimaging

## 2015-01-26 ENCOUNTER — Encounter: Payer: Self-pay | Admitting: Diagnostic Neuroimaging

## 2015-01-26 VITALS — BP 122/76 | HR 86 | Ht 65.5 in | Wt 244.6 lb

## 2015-01-26 DIAGNOSIS — G932 Benign intracranial hypertension: Secondary | ICD-10-CM

## 2015-01-26 MED ORDER — ACETAZOLAMIDE ER 500 MG PO CP12: 500.0000 mg | ORAL_CAPSULE | Freq: Two times a day (BID) | ORAL | Status: DC

## 2015-01-26 NOTE — Progress Notes (Signed)
[facility]  PATIENT: Breanna [name] DOB: 02-13-1981  REFERRING CLINICIAN: Baird Cancer HISTORY FROM: patient  REASON FOR VISIT: new consult    HISTORICAL  CHIEF COMPLAINT:  Chief Complaint  Patient presents with  . Papilledema    rm 7, New patient, daughter- [name]    HISTORY OF PRESENT ILLNESS:   34 year old right-handed female here for evaluation of pseudotumor cerebri.  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 She started on Diamox. She followed up in neurology clinic had MRI and lumbar puncture, which confirm pseudotumor cerebri. By 2014 her symptoms had resolved. She stopped Diamox. I symptoms recurred and she had to restart medication. I favor a 2016 she again stop Diamox due to resolution of symptoms.   Over the past 1-2 months patient has had increasing ringing in the ears, headaches, blurred vision. She has had episodes of transient visual obscuration. Patient followed up with ophthalmologist who noted 1+ papilledema bilaterally. Medication treatment was restarted with Diamox.  Patient also struggles with pain of fibromyalgia. She also struggles with her weight. Her weight has gradually increased over past few years.    REVIEW OF SYSTEMS: Full 14 system review of systems performed and notable only for ringing in ears aching muscles allergies anxiety not enough sleep headache insomnia restless legs.  ALLERGIES: Allergies  Allergen Reactions  . Codeine Itching and Rash  . Latex Itching and Rash  . Penicillins Itching and Rash  . Sulfa Antibiotics Itching and Rash    HOME MEDICATIONS: Outpatient Prescriptions Prior to Visit  Medication Sig Dispense Refill  . acetaZOLAMIDE (DIAMOX) 500 MG capsule Take 500 mg by mouth daily.     Marland Kitchen omeprazole (PRILOSEC) 40 MG capsule Take 40 mg by mouth daily.     . cetirizine (ZYRTEC) 10 MG tablet Take 10 mg by mouth daily.    Marland Kitchen dexlansoprazole (DEXILANT) 60 MG  capsule Take 60 mg by mouth daily.    . fluticasone (FLONASE) 50 MCG/ACT nasal spray Place 1 spray into the nose as needed.      No facility-administered medications prior to visit.    PAST MEDICAL HISTORY: Past Medical History  Diagnosis Date  . GERD (gastroesophageal reflux disease)   . Anxiety   . Allergy   . Obesity   . Pseudotumor cerebri syndrome     takes diamox  . Pain of both breasts 05/21/2014  . Breast nodule 05/21/2014    PAST SURGICAL HISTORY: Past Surgical History  Procedure Laterality Date  . Cesarean section      x 2  . Cholecystectomy      age 51  . Wisdom tooth extraction      FAMILY HISTORY: Family History  Problem Relation Age of Onset  . COPD Mother   . Pneumonia Mother   . Hyperlipidemia Father   . Heart disease Maternal Aunt   . Hypertension Maternal Grandmother   . Diabetes Maternal Grandmother   . Stroke Paternal Grandmother   . Hypertension Paternal Grandmother   . Diabetes Paternal Grandmother   . Stomach cancer Paternal Grandmother   . Diabetes Paternal Grandfather   . Heart disease Paternal Grandfather   . Hyperlipidemia Paternal Grandfather   . Hypertension Paternal Grandfather     SOCIAL HISTORY:  History   Social History  . Marital Status: Married    Spouse Name: Donnie  . Number of Children: 2  . Years of Education: 14   Occupational History  .  LPN, [employer]   Social History Main Topics  . Smoking status: Never Smoker   . Smokeless tobacco: Never Used  . Alcohol Use: No  . Drug Use: No  . Sexual Activity: Yes    Birth Control/ Protection: Implant   Other Topics Concern  . Not on file   Social History Narrative   Lives at home with family, 1 son, 1 daughter   Caffeine use - tea 2 - 32 oz cups daily     PHYSICAL EXAM   GENERAL EXAM/CONSTITUTIONAL: Vitals:  Filed Vitals:   01/26/15 1056  BP: 122/76  Pulse: 86  Height: 5' 5.5" (1.664 m)  Weight: 244 lb 9.6 oz (110.95 kg)     Body  mass index is 40.07 kg/(m^2).  Visual Acuity Screening   Right eye Left eye Both eyes  Without correction:     With correction: 20/30 20/30      Patient is in no distress; well developed, nourished and groomed; neck is supple  CARDIOVASCULAR:  Examination of carotid arteries is normal; no carotid bruits  Regular rate and rhythm, no murmurs  Examination of peripheral vascular system by observation and palpation is normal  EYES:  Ophthalmoscopic exam of optic discs and posterior segments is NOTABLE FOR MILD PAPILLEDEMA BILATERALLY  MUSCULOSKELETAL:  Gait, strength, tone, movements noted in Neurologic exam below  NEUROLOGIC: MENTAL STATUS:  No flowsheet data found.  awake, alert, oriented to person, place and time  recent and remote memory intact  normal attention and concentration  language fluent, comprehension intact, naming intact,   fund of knowledge appropriate  CRANIAL NERVE:   2nd - MILD PAPILLEDEMA BILATERALLY  2nd, 3rd, 4th, 6th - pupils equal and reactive to light, visual fields full to confrontation, extraocular muscles intact, no nystagmus  5th - facial sensation symmetric  7th - facial strength symmetric  8th - hearing intact  9th - palate elevates symmetrically, uvula midline  11th - shoulder shrug symmetric  12th - tongue protrusion midline  MOTOR:   normal bulk and tone, full strength in the BUE, BLE  SENSORY:   normal and symmetric to light touch, pinprick, temperature, vibration    COORDINATION:   finger-nose-finger, fine finger movements normal  REFLEXES:   deep tendon reflexes present and symmetric  GAIT/STATION:   narrow based gait; able to walk on toes, heels and tandem; romberg is negative    DIAGNOSTIC DATA (LABS, IMAGING, TESTING) - I reviewed patient records, labs, notes, testing and imaging myself where available.  Lab Results  Component Value Date   WBC 8.5 08/22/2013   HGB 15.9* 08/22/2013   HCT 45.7  08/22/2013   MCV 92.5 08/22/2013   PLT 276 08/22/2013      Component Value Date/Time   NA 139 08/22/2013 1039   K 4.0 08/22/2013 1039   CL 108 08/22/2013 1039   CO2 21 08/22/2013 1039   GLUCOSE 94 08/22/2013 1039   BUN 10 08/22/2013 1039   CREATININE 1.08 08/22/2013 1039   CREATININE 1.19 01/16/2008 2104   CALCIUM 9.1 08/22/2013 1039   PROT 6.9 08/22/2013 1039   ALBUMIN 4.2 08/22/2013 1039   AST 16 08/22/2013 1039   ALT 16 08/22/2013 1039   ALKPHOS 58 08/22/2013 1039   BILITOT 0.8 08/22/2013 1039   GFRNONAA 54* 01/16/2008 2104   GFRAA  01/16/2008 2104    >60        The eGFR has been calculated using the MDRD equation. This calculation has not been validated  in all clinical   Lab Results  Component Value Date   CHOL 156 08/22/2013   HDL 49 08/22/2013   LDLCALC 92 08/22/2013   TRIG 77 08/22/2013   CHOLHDL 3.2 08/22/2013   No results found for: HGBA1C No results found for: VITAMINB12 Lab Results  Component Value Date   TSH 1.061 08/22/2013        ASSESSMENT AND PLAN  34 y.o. year old female here with:  Dx: Pseudotumor cerebri  PLAN: - continue acetazolamide 543m daily; increase up to twice a day - follow up with Dr. SBaird Cancerfor retina monitoring - sleep study (obesity, headaches, daytime sleepiness)  Meds ordered this encounter  Medications  . acetaZOLAMIDE (DIAMOX) 500 MG capsule    Sig: Take 1 capsule (500 mg total) by mouth 2 (two) times daily.    Dispense:  60 capsule    Refill:  6   Orders Placed This Encounter  Procedures  . Ambulatory referral to Sleep Studies   Return in about 3 months (around 04/28/2015).    VPenni Bombard MD [id number] 184:03AM Certified in Neurology, Neurophysiology and Neuroimaging  [facility] [address] [place]eland[place]oessel [place] [phone]

## 2015-03-09 ENCOUNTER — Institutional Professional Consult (permissible substitution): Payer: Commercial Managed Care - HMO | Admitting: Neurology

## 2015-04-30 ENCOUNTER — Ambulatory Visit: Payer: Commercial Managed Care - HMO | Admitting: Diagnostic Neuroimaging

## 2015-06-16 ENCOUNTER — Encounter: Payer: Self-pay | Admitting: Advanced Practice Midwife

## 2015-06-16 ENCOUNTER — Ambulatory Visit (INDEPENDENT_AMBULATORY_CARE_PROVIDER_SITE_OTHER): Payer: Commercial Managed Care - HMO | Admitting: Advanced Practice Midwife

## 2015-06-16 VITALS — BP 120/88 | HR 88 | Ht 65.0 in | Wt 252.5 lb

## 2015-06-16 DIAGNOSIS — N63 Unspecified lump in unspecified breast: Secondary | ICD-10-CM

## 2015-06-16 DIAGNOSIS — Z30017 Encounter for initial prescription of implantable subdermal contraceptive: Secondary | ICD-10-CM | POA: Diagnosis not present

## 2015-06-16 DIAGNOSIS — Z3202 Encounter for pregnancy test, result negative: Secondary | ICD-10-CM

## 2015-06-16 DIAGNOSIS — Z3046 Encounter for surveillance of implantable subdermal contraceptive: Secondary | ICD-10-CM

## 2015-06-16 LAB — POCT URINE PREGNANCY: Preg Test, Ur: NEGATIVE

## 2015-06-16 NOTE — Progress Notes (Signed)
[name] [name] 34 y.o. There were no vitals filed for this visit.  Past Medical History  Diagnosis Date  . GERD (gastroesophageal reflux disease)   . Anxiety   . Allergy   . Obesity   . Pseudotumor cerebri syndrome     takes diamox  . Pain of both breasts 05/21/2014  . Breast nodule 05/21/2014    Family History  Problem Relation Age of Onset  . COPD Mother   . Pneumonia Mother   . Hyperlipidemia Father   . Heart disease Maternal Aunt   . Hypertension Maternal Grandmother   . Diabetes Maternal Grandmother   . Stroke Paternal Grandmother   . Hypertension Paternal Grandmother   . Diabetes Paternal Grandmother   . Stomach cancer Paternal Grandmother   . Diabetes Paternal Grandfather   . Heart disease Paternal Grandfather   . Hyperlipidemia Paternal Grandfather   . Hypertension Paternal Grandfather     Social History   Social History  . Marital Status: Married    Spouse Name: [name] [name]  . Number of Children: 2  . Years of Education: 14   Occupational History  .      LPN, [employer]   Social History Main Topics  . Smoking status: Never Smoker   . Smokeless tobacco: Never Used  . Alcohol Use: No  . Drug Use: No  . Sexual Activity: Yes    Birth Control/ Protection: Implant   Other Topics Concern  . Not on file   Social History Narrative   Lives at home with family, 1 son, 1 daughter   Caffeine use - tea 2 - 32 oz cups daily    HPI:  [name] [name] is here for Nexplanon removal and reinsertion.  She was given informed consent for removal of her Nexplanon and reinsertion of a new one.A signed copy is in the chart.  Appropriate time out taken. Nexlanon site (left arm) identified and thea area was prepped in usual sterile fashon. 2 cc of 1% lidocaine was used to anesthetize the area starting with the distal end of the implant. A small stab incision was made right beside the implant on the distal portion.  The Nexplanon rod was grasped using hemostats and  removed without difficulty.  There was less than 3 cc blood loss. There were no complications. Next, the area was cleansed again and the new Nexplanon was inserted without difficulty.  Steri strips and a pressure bandage were applied.  Pt was instructed to remove pressure bandage in a few hours, and keep insertion site covered with a bandaid for 3 days.  Follow-up scheduled PRN problems  [name] [name] 06/16/2015 3:03 PM

## 2015-06-16 NOTE — Patient Instructions (Addendum)
Etonogestrel implant What is this medicine? ETONOGESTREL (et oh noe JES trel) is a contraceptive (birth control) device. It is used to prevent pregnancy. It can be used for up to 3 years. This medicine may be used for other purposes; ask your health care provider or pharmacist if you have questions. What should I tell my health care provider before I take this medicine? They need to know if you have any of these conditions: -abnormal vaginal bleeding -blood vessel disease or blood clots -cancer of the breast, cervix, or liver -depression -diabetes -gallbladder disease -headaches -heart disease or recent heart attack -high blood pressure -high cholesterol -kidney disease -liver disease -renal disease -seizures -tobacco smoker -an unusual or allergic reaction to etonogestrel, other hormones, anesthetics or antiseptics, medicines, foods, dyes, or preservatives -pregnant or trying to get pregnant -breast-feeding How should I use this medicine? This device is inserted just under the skin on the inner side of your upper arm by a health care professional. Talk to your pediatrician regarding the use of this medicine in children. Special care may be needed. Overdosage: If you think you have taken too much of this medicine contact a poison control center or emergency room at once. NOTE: This medicine is only for you. Do not share this medicine with others. What if I miss a dose? This does not apply. What may interact with this medicine? Do not take this medicine with any of the following medications: -amprenavir -bosentan -fosamprenavir This medicine may also interact with the following medications: -barbiturate medicines for inducing sleep or treating seizures -certain medicines for fungal infections like ketoconazole and itraconazole -griseofulvin -medicines to treat seizures like carbamazepine, felbamate, oxcarbazepine, phenytoin,  topiramate -modafinil -phenylbutazone -rifampin -some medicines to treat HIV infection like atazanavir, indinavir, lopinavir, nelfinavir, tipranavir, ritonavir -St. John's wort This list may not describe all possible interactions. Give your health care provider a list of all the medicines, herbs, non-prescription drugs, or dietary supplements you use. Also tell them if you smoke, drink alcohol, or use illegal drugs. Some items may interact with your medicine. What should I watch for while using this medicine? This product does not protect you against HIV infection (AIDS) or other sexually transmitted diseases. You should be able to feel the implant by pressing your fingertips over the skin where it was inserted. Contact your doctor if you cannot feel the implant, and use a non-hormonal birth control method (such as condoms) until your doctor confirms that the implant is in place. If you feel that the implant may have broken or become bent while in your arm, contact your healthcare provider. What side effects may I notice from receiving this medicine? Side effects that you should report to your doctor or health care professional as soon as possible: -allergic reactions like skin rash, itching or hives, swelling of the face, lips, or tongue -breast lumps -changes in emotions or moods -depressed mood -heavy or prolonged menstrual bleeding -pain, irritation, swelling, or bruising at the insertion site -scar at site of insertion -signs of infection at the insertion site such as fever, and skin redness, pain or discharge -signs of pregnancy -signs and symptoms of a blood clot such as breathing problems; changes in vision; chest pain; severe, sudden headache; pain, swelling, warmth in the leg; trouble speaking; sudden numbness or weakness of the face, arm or leg -signs and symptoms of liver injury like dark yellow or brown urine; general ill feeling or flu-like symptoms; light-colored stools; loss of  appetite; nausea; right upper belly   pain; unusually weak or tired; yellowing of the eyes or skin -unusual vaginal bleeding, discharge -signs and symptoms of a stroke like changes in vision; confusion; trouble speaking or understanding; severe headaches; sudden numbness or weakness of the face, arm or leg; trouble walking; dizziness; loss of balance or coordination Side effects that usually do not require medical attention (Report these to your doctor or health care professional if they continue or are bothersome.): -acne -back pain -breast pain -changes in weight -dizziness -general ill feeling or flu-like symptoms -headache -irregular menstrual bleeding -nausea -sore throat -vaginal irritation or inflammation This list may not describe all possible side effects. Call your doctor for medical advice about side effects. You may report side effects to FDA at 1-800-FDA-1088. Where should I keep my medicine? This drug is given in a hospital or clinic and will not be stored at home. NOTE: This sheet is a summary. It may not cover all possible information. If you have questions about this medicine, talk to your doctor, pharmacist, or health care provider.    2016, Elsevier/Gold Standard. (2014-04-17 14:07:06)    HIBICLENS SOAP

## 2015-08-06 ENCOUNTER — Ambulatory Visit: Payer: Commercial Managed Care - HMO | Admitting: "Endocrinology

## 2015-09-09 ENCOUNTER — Encounter: Payer: Self-pay | Admitting: Gastroenterology

## 2015-09-21 ENCOUNTER — Ambulatory Visit (HOSPITAL_COMMUNITY)
Admission: RE | Admit: 2015-09-21 | Discharge: 2015-09-21 | Disposition: A | Discharge status: Home / Self Care | Payer: Commercial Managed Care - HMO | Source: Ambulatory Visit | Attending: Physician Assistant | Admitting: Physician Assistant

## 2015-09-21 ENCOUNTER — Other Ambulatory Visit (HOSPITAL_COMMUNITY): Payer: Self-pay | Admitting: Physician Assistant

## 2015-09-21 DIAGNOSIS — E669 Obesity, unspecified: Secondary | ICD-10-CM | POA: Insufficient documentation

## 2015-09-21 DIAGNOSIS — R0602 Shortness of breath: Secondary | ICD-10-CM | POA: Insufficient documentation

## 2015-09-21 DIAGNOSIS — R0600 Dyspnea, unspecified: Secondary | ICD-10-CM

## 2015-09-29 ENCOUNTER — Other Ambulatory Visit: Payer: Self-pay

## 2015-09-29 ENCOUNTER — Encounter: Payer: Self-pay | Admitting: Gastroenterology

## 2015-09-29 ENCOUNTER — Ambulatory Visit (INDEPENDENT_AMBULATORY_CARE_PROVIDER_SITE_OTHER): Payer: Commercial Managed Care - HMO | Admitting: Gastroenterology

## 2015-09-29 VITALS — BP 142/83 | HR 86 | Temp 98.4°F | Ht 65.0 in | Wt 240.4 lb

## 2015-09-29 DIAGNOSIS — K219 Gastro-esophageal reflux disease without esophagitis: Secondary | ICD-10-CM | POA: Diagnosis not present

## 2015-09-29 DIAGNOSIS — R1310 Dysphagia, unspecified: Secondary | ICD-10-CM

## 2015-09-29 DIAGNOSIS — K625 Hemorrhage of anus and rectum: Secondary | ICD-10-CM

## 2015-09-29 MED ORDER — HYDROCORTISONE ACETATE 25 MG RE SUPP: 25.0000 mg | Freq: Two times a day (BID) | RECTAL | Status: DC

## 2015-09-29 MED ORDER — DEXLANSOPRAZOLE 60 MG PO CPDR
60.0000 mg | DELAYED_RELEASE_CAPSULE | Freq: Every day | ORAL | Status: DC
Start: 2015-09-29 — End: 2015-11-08

## 2015-09-29 MED ORDER — PEG-KCL-NACL-NASULF-NA ASC-C 100 G PO SOLR: 1.0000 | ORAL | Status: DC

## 2015-09-29 NOTE — Assessment & Plan Note (Addendum)
Intermittent low-volume paper hematochezia likely secondary to internal hemorrhoids. Chronic. Recent diarrheal illness now resolved. Stool studies negative. Back to baseline. Discussed hemorrhoid banding but will proceed with colonoscopy to exclude any occult issues.   Proceed with colonoscopy with Dr. Oneida Alar in the near future. The risks, benefits, and alternatives have been discussed in detail with the patient. They state understanding and desire to proceed.  Hemorrhoid banding at time of procedure as appropriate  Phenergan 25 mg IV on call

## 2015-09-29 NOTE — Progress Notes (Signed)
PA# for TCS- [id number]

## 2015-09-29 NOTE — Assessment & Plan Note (Addendum)
35 year old female with breakthrough GERD despite BID PPI therapy. Intermittent solid food dysphagia, query secondary to uncontrolled GERD, web, ring, stricture. Will change to Dexilant once daily, recommend diet and behavior modification, and proceed with EGD/dilation in near future.  Proceed with upper endoscopy +/- dilation in the near future with Dr. Oneida Alar. The risks, benefits, and alternatives have been discussed in detail with patient. They have stated understanding and desire to proceed.  Dexilant samples and prescription sent  Phenergan 25 mg IV on call

## 2015-09-29 NOTE — Patient Instructions (Signed)
I have provided Dexilant samples and sent Anusol suppositories to your pharmacy. Stop Prilosec and only take Dexilant.  We have scheduled you for a colonoscopy with possible banding, endoscopy with dilation with Dr. Oneida Alar.

## 2015-09-29 NOTE — Progress Notes (Signed)
Referring Provider: Sharilyn Sites, MD Primary Care Physician:  Purvis Kilts, MD  Primary GI: Dr. Oneida Alar   Chief Complaint  Patient presents with  . Abdominal Pain    HPI:   Breanna [name] is a 35 y.o. female presenting today at the request of her PCP secondary to abdominal pain and diarrhea. She is an Corporate treasurer at  [employer]. Thought she had Cdiff. Had diarrhea for a week. Was having stomach cramps. Stool studies completed. Cdiff negative, stool culture and O&P negative. Lost 12 lbs within 2-3 week period. Now staying stable. Has flare ups of GERD. On Prilosec BID, sometimes forgets to take it twice.  Occasionally has swallowing issues at the end of the shift. If getting a biscuit on way home, feels like can't swallow. Doesn't know if it's because she is tired or not. There have been times she has had to regurgitate food because it won't go down. Dexilant helped somewhat better.     No further diarrhea. [place] stool scale #5-6 baseline. Hemorrhoids give her a fit. Some paper hematochezia occasionally. Painful rectum at times. Has Anusol at home but would like suppositories instead. Has LUQ spasms intermittently, cramping "really hard". Feels like a balloon deflating. Will be sore for the next few days. States her dad has them too.   Past Medical History  Diagnosis Date  . GERD (gastroesophageal reflux disease)   . Anxiety   . Allergy   . Obesity   . Pseudotumor cerebri syndrome     takes diamox  . Pain of both breasts 05/21/2014  . Breast nodule 05/21/2014    Past Surgical History  Procedure Laterality Date  . Cesarean section      x 2  . Cholecystectomy      age 20  . Wisdom tooth extraction      Current Outpatient Prescriptions  Medication Sig Dispense Refill  . acetaZOLAMIDE (DIAMOX) 500 MG capsule Take 1 capsule (500 mg total) by mouth 2 (two) times daily. 60 capsule 6  . cetirizine (ZYRTEC) 10 MG tablet Take 10 mg by mouth daily.    . fluticasone  (FLONASE) 50 MCG/ACT nasal spray Place 1 spray into the nose as needed.     . Ibuprofen 200 MG CAPS Take 800 mg by mouth 2 (two) times daily as needed.    Marland Kitchen omeprazole (PRILOSEC) 40 MG capsule Take 40 mg by mouth daily.     Marland Kitchen dexlansoprazole (DEXILANT) 60 MG capsule Take 1 capsule (60 mg total) by mouth daily. 90 capsule 3  . hydrocortisone (ANUSOL-HC) 25 MG suppository Place 1 suppository (25 mg total) rectally every 12 (twelve) hours. 12 suppository 1  . peg 3350 powder (MOVIPREP) 100 g SOLR Take 1 kit (200 g total) by mouth as directed. 1 kit 0   No current facility-administered medications for this visit.    Allergies as of 09/29/2015 - Review Complete 09/29/2015  Allergen Reaction Noted  . Codeine Itching and Rash 08/22/2013  . Latex Itching and Rash 08/22/2013  . Penicillins Itching and Rash 08/22/2013  . Sulfa antibiotics Itching and Rash 08/22/2013    Family History  Problem Relation Age of Onset  . COPD Mother   . Pneumonia Mother   . Hyperlipidemia Father   . Heart disease Maternal Aunt   . Hypertension Maternal Grandmother   . Diabetes Maternal Grandmother   . Stroke Paternal Grandmother   . Hypertension Paternal Grandmother   . Diabetes Paternal Grandmother   .  Stomach cancer Paternal Grandmother   . Diabetes Paternal Grandfather   . Heart disease Paternal Grandfather   . Hyperlipidemia Paternal Grandfather   . Hypertension Paternal Grandfather   . Colon cancer Neg Hx     Social History   Social History  . Marital Status: Married    Spouse Name: Donnie  . Number of Children: 2  . Years of Education: 14   Occupational History  .      LPN, [employer]   Social History Main Topics  . Smoking status: Never Smoker   . Smokeless tobacco: Never Used  . Alcohol Use: No  . Drug Use: No  . Sexual Activity: Yes    Birth Control/ Protection: Implant   Other Topics Concern  . None   Social History Narrative   Lives at home with family, 1 son, 1  daughter   Caffeine use - tea 2 - 32 oz cups daily    Review of Systems: Negative unless mentioned in HPI   Physical Exam: BP 142/83 mmHg  Pulse 86  Temp(Src) 98.4 F (36.9 C)  Ht '5\' 5"'$  (1.651 m)  Wt 240 lb 6.4 oz (109.045 kg)  BMI 40.00 kg/m2 General:   Alert and oriented. No distress noted. Pleasant and cooperative.  Head:  Normocephalic and atraumatic. Eyes:  Conjuctiva clear without scleral icterus. Mouth:  Oral mucosa pink and moist. Good dentition. No lesions. Heart:  S1, S2 present without murmurs, rubs, or gallops. Regular rate and rhythm. Abdomen:  +BS, soft, non-tender and non-distended. No rebound or guarding. No HSM or masses noted. Msk:  Symmetrical without gross deformities. Normal posture. Extremities:  Without edema. Neurologic:  Alert and  oriented x4;  grossly normal neurologically. Psych:  Alert and cooperative. Normal mood and affect.

## 2015-09-29 NOTE — Assessment & Plan Note (Signed)
Dilation as appropriate.  

## 2015-09-29 NOTE — Progress Notes (Signed)
cc'ed to pcp °

## 2015-11-01 ENCOUNTER — Other Ambulatory Visit: Payer: Self-pay

## 2015-11-01 MED ORDER — SOD PICOSULFATE-MAG OX-CIT ACD 10-3.5-12 MG-GM-GM PO PACK: 1.0000 | PACK | Freq: Once | ORAL | Status: DC

## 2015-11-08 ENCOUNTER — Encounter (HOSPITAL_COMMUNITY): Payer: Self-pay | Admitting: *Deleted

## 2015-11-08 ENCOUNTER — Ambulatory Visit (HOSPITAL_COMMUNITY)
Admission: RE | Admit: 2015-11-08 | Discharge: 2015-11-08 | Disposition: A | Discharge status: Home / Self Care | Payer: Commercial Managed Care - HMO | Source: Ambulatory Visit | Attending: Gastroenterology | Admitting: Gastroenterology

## 2015-11-08 ENCOUNTER — Encounter (HOSPITAL_COMMUNITY)
Admission: RE | Disposition: A | Discharge status: Home / Self Care | Payer: Self-pay | Source: Ambulatory Visit | Attending: Gastroenterology

## 2015-11-08 DIAGNOSIS — Q438 Other specified congenital malformations of intestine: Secondary | ICD-10-CM | POA: Diagnosis not present

## 2015-11-08 DIAGNOSIS — E669 Obesity, unspecified: Secondary | ICD-10-CM | POA: Diagnosis not present

## 2015-11-08 DIAGNOSIS — K2970 Gastritis, unspecified, without bleeding: Secondary | ICD-10-CM | POA: Diagnosis not present

## 2015-11-08 DIAGNOSIS — F419 Anxiety disorder, unspecified: Secondary | ICD-10-CM | POA: Insufficient documentation

## 2015-11-08 DIAGNOSIS — K625 Hemorrhage of anus and rectum: Secondary | ICD-10-CM

## 2015-11-08 DIAGNOSIS — G932 Benign intracranial hypertension: Secondary | ICD-10-CM | POA: Diagnosis not present

## 2015-11-08 DIAGNOSIS — K648 Other hemorrhoids: Secondary | ICD-10-CM | POA: Diagnosis not present

## 2015-11-08 DIAGNOSIS — K219 Gastro-esophageal reflux disease without esophagitis: Secondary | ICD-10-CM | POA: Insufficient documentation

## 2015-11-08 DIAGNOSIS — K921 Melena: Secondary | ICD-10-CM | POA: Diagnosis not present

## 2015-11-08 DIAGNOSIS — Z79899 Other long term (current) drug therapy: Secondary | ICD-10-CM | POA: Insufficient documentation

## 2015-11-08 DIAGNOSIS — K644 Residual hemorrhoidal skin tags: Secondary | ICD-10-CM | POA: Insufficient documentation

## 2015-11-08 DIAGNOSIS — R1310 Dysphagia, unspecified: Secondary | ICD-10-CM | POA: Diagnosis present

## 2015-11-08 DIAGNOSIS — Z6841 Body Mass Index (BMI) 40.0 and over, adult: Secondary | ICD-10-CM | POA: Insufficient documentation

## 2015-11-08 DIAGNOSIS — Q394 Esophageal web: Secondary | ICD-10-CM

## 2015-11-08 DIAGNOSIS — K317 Polyp of stomach and duodenum: Secondary | ICD-10-CM | POA: Diagnosis not present

## 2015-11-08 DIAGNOSIS — K2950 Unspecified chronic gastritis without bleeding: Secondary | ICD-10-CM | POA: Diagnosis not present

## 2015-11-08 HISTORY — PX: SAVORY DILATION: SHX5439

## 2015-11-08 HISTORY — PX: COLONOSCOPY: SHX5424

## 2015-11-08 HISTORY — PX: BIOPSY: SHX5522

## 2015-11-08 HISTORY — PX: HEMORRHOID BANDING: SHX5850

## 2015-11-08 HISTORY — PX: ESOPHAGOGASTRODUODENOSCOPY: SHX5428

## 2015-11-08 SURGERY — COLONOSCOPY
Anesthesia: Moderate Sedation

## 2015-11-08 MED ORDER — LIDOCAINE VISCOUS 2 % MT SOLN
OROMUCOSAL | Status: DC
Start: 2015-11-08 — End: 2015-11-08
  Filled 2015-11-08: qty 15

## 2015-11-08 MED ORDER — PROMETHAZINE HCL 25 MG/ML IJ SOLN
INTRAMUSCULAR | Status: AC
  Administered 2015-11-08: 25 mg
  Filled 2015-11-08: qty 1

## 2015-11-08 MED ORDER — SODIUM CHLORIDE 0.9 % IV SOLN
INTRAVENOUS | Status: DC
  Administered 2015-11-08: 09:00:00 via INTRAVENOUS

## 2015-11-08 MED ORDER — STERILE WATER FOR IRRIGATION IR SOLN
Status: DC | PRN
  Administered 2015-11-08: 10:00:00

## 2015-11-08 MED ORDER — MINERAL OIL PO OIL
TOPICAL_OIL | ORAL | Status: AC
  Filled 2015-11-08: qty 30

## 2015-11-08 MED ORDER — PANTOPRAZOLE SODIUM 40 MG PO TBEC: DELAYED_RELEASE_TABLET | ORAL | Status: DC

## 2015-11-08 MED ORDER — MIDAZOLAM HCL 5 MG/5ML IJ SOLN
INTRAMUSCULAR | Status: DC | PRN
  Administered 2015-11-08 (×5): 2 mg via INTRAVENOUS

## 2015-11-08 MED ORDER — MEPERIDINE HCL 100 MG/ML IJ SOLN
INTRAMUSCULAR | Status: AC
  Filled 2015-11-08: qty 2

## 2015-11-08 MED ORDER — MEPERIDINE HCL 100 MG/ML IJ SOLN
INTRAMUSCULAR | Status: DC | PRN
  Administered 2015-11-08 (×2): 25 mg via INTRAVENOUS
  Administered 2015-11-08: 50 mg via INTRAVENOUS
  Administered 2015-11-08 (×3): 25 mg via INTRAVENOUS

## 2015-11-08 MED ORDER — SODIUM CHLORIDE 0.9% FLUSH
INTRAVENOUS | Status: AC
  Filled 2015-11-08: qty 10

## 2015-11-08 MED ORDER — MIDAZOLAM HCL 5 MG/5ML IJ SOLN
INTRAMUSCULAR | Status: AC
  Filled 2015-11-08: qty 10

## 2015-11-08 NOTE — H&P (Signed)
Primary Care Physician:  Purvis Kilts, MD Primary Gastroenterologist:  Dr. Oneida Alar  Pre-Procedure History & Physical: HPI:  Breanna [name] is a 35 y.o. female here for DYSPHAGIA/brbpr.  Past Medical History  Diagnosis Date  . GERD (gastroesophageal reflux disease)   . Anxiety   . Allergy   . Obesity   . Pseudotumor cerebri syndrome     takes diamox  . Pain of both breasts 05/21/2014  . Breast nodule 05/21/2014    Past Surgical History  Procedure Laterality Date  . Cesarean section      x 2  . Cholecystectomy      age 64  . Wisdom tooth extraction      Prior to Admission medications   Medication Sig Start Date End Date Taking? Authorizing Provider  acetaminophen (TYLENOL) 500 MG tablet Take 1,000 mg by mouth every 6 (six) hours as needed for mild pain.   Yes Historical Provider, MD  acetaZOLAMIDE (DIAMOX) 500 MG capsule Take 1 capsule (500 mg total) by mouth 2 (two) times daily. 01/26/15  Yes Penni Bombard, MD  ALPRAZolam (XANAX) 0.5 MG tablet Take 1 tablet by mouth daily as needed for anxiety.  09/22/15  Yes Historical Provider, MD  cetirizine (ZYRTEC) 10 MG tablet Take 10 mg by mouth daily.   Yes Historical Provider, MD  hydrocortisone (ANUSOL-HC) 2.5 % rectal cream Place 1 application rectally 2 (two) times daily as needed for hemorrhoids or itching.   Yes Historical Provider, MD  omeprazole (PRILOSEC) 40 MG capsule Take 40 mg by mouth daily.  06/03/13  Yes Historical Provider, MD  Sod Picosulfate-Mag Ox-Cit Acd ([place]) 10-3.5-12 MG-GM-GM PACK Take 1 kit by mouth once. 11/01/15  Yes Orvil Feil, NP  dexlansoprazole (DEXILANT) 60 MG capsule Take 1 capsule (60 mg total) by mouth daily. Patient not taking: Reported on 11/02/2015 09/29/15   Orvil Feil, NP  hydrocortisone (ANUSOL-HC) 25 MG suppository Place 1 suppository (25 mg total) rectally every 12 (twelve) hours. Patient not taking: Reported on 11/02/2015 09/29/15   Orvil Feil, NP  ibuprofen (ADVIL,MOTRIN) 200 MG  tablet Take 400 mg by mouth every 6 (six) hours as needed for moderate pain.    Historical Provider, MD    Allergies as of 09/29/2015 - Review Complete 09/29/2015  Allergen Reaction Noted  . Codeine Itching and Rash 08/22/2013  . Latex Itching and Rash 08/22/2013  . Penicillins Itching and Rash 08/22/2013  . Sulfa antibiotics Itching and Rash 08/22/2013    Family History  Problem Relation Age of Onset  . COPD Mother   . Pneumonia Mother   . Hyperlipidemia Father   . Heart disease Maternal Aunt   . Hypertension Maternal Grandmother   . Diabetes Maternal Grandmother   . Stroke Paternal Grandmother   . Hypertension Paternal Grandmother   . Diabetes Paternal Grandmother   . Stomach cancer Paternal Grandmother   . Diabetes Paternal Grandfather   . Heart disease Paternal Grandfather   . Hyperlipidemia Paternal Grandfather   . Hypertension Paternal Grandfather   . Colon cancer Neg Hx     Social History   Social History  . Marital Status: Married    Spouse Name: Donnie  . Number of Children: 2  . Years of Education: 14   Occupational History  .      LPN, [employer]   Social History Main Topics  . Smoking status: Never Smoker   . Smokeless tobacco: Never Used  . Alcohol Use: No  .  Drug Use: No  . Sexual Activity: Yes    Birth Control/ Protection: Implant   Other Topics Concern  . Not on file   Social History Narrative   Lives at home with family, 1 son, 1 daughter   Caffeine use - tea 2 - 32 oz cups daily    Review of Systems: See HPI, otherwise negative ROS   Physical Exam: BP 139/91 mmHg  Pulse 100  Temp(Src) 98.8 F (37.1 C) (Oral)  Resp 18  Ht '5\' 5"'$  (1.651 m)  Wt 242 lb (109.77 kg)  BMI 40.27 kg/m2  SpO2 98% General:   Alert,  pleasant and cooperative in NAD Head:  Normocephalic and atraumatic. Neck:  Supple; Lungs:  Clear throughout to auscultation.    Heart:  Regular rate and rhythm. Abdomen:  Soft, nontender and nondistended. Normal  bowel sounds, without guarding, and without rebound.   Neurologic:  Alert and  oriented x4;  grossly normal neurologically.  Impression/Plan:   DYSPHAGIA/brbpr  PLAN:  TCS-POSSIBLE HEMORRHOID BANDING/EGD/POSSIBLE DILATION TODAY

## 2015-11-09 NOTE — Discharge Instructions (Signed)
You DID NOT HAVE COLON polyp. YOU HAVE GASTRITIS DUE TO IBUPROFEN & STOMACH POLYPS. I STRETCHED YOUR ESOPHAGUS BECAUSE OF YOUR PROBLEM SWALLOWING, WHICH IS MOST LIKELY DUE TO REFLUX. I DID NOT SEE A DEFINITE STRICTURE. YOU HAVE A FLOPPY LEFT COLON. YOU HAVE A FIXED RECTOSIGMOID JUNCTION. I HAD TO APPLY PRESSURE TO YOUR ABDOMEN TO COMPLETE YOUR COLONOSCOPY.  YOUR RECTAL BLEEDING IS DUE TO HAVE MODERATE INTERNAL HEMORRHOIDS AND YOU HAVE MODERATE SIZE EXTERNAL HEMORRHOIDS. I BIOPSIED YOUR STOMACH.   BECAUSE YOU HAVE MODERATE SIZE INTERNAL AND EXTERNAL HEMORRHOIDS, YOU SHOULD SEE A SURGEON TO FIX YOUR HEMORRHOIDS.  TO BEST CONTROL YOUR REFLUX-1. CONTINUE YOUR WEIGHT LOSS EFFORTS. 2. AVOID REFLUX TRIGGERS AND 3. USE PROTONIX OR OMEPRAZOLE. OBESITY IS ASSOCIATED WITH INCREASED RISK OF GETTING ALL CANCERS, INCLUDING ESOPHAGEAL AND COLON CANCER. YOUR BODY MASS INDEX IS OVER 40 WHICH MEANS YOU ARE MORBIDLY OBESE AND SHORTENS YOUR LIFE EXPECTANCY 10 YEARS.   FOLLOW A HIGH FIBER/LOW FAT DIET. AVOID ITEMS THAT CAUSE BLOATING. MEATS SHOULD BE BAKED, BROILED, OR BOILED. AVOID FRIED FOODS. SEE INFO BELOW.  START PROTONIX. TAKE 30 MINUTES PRIOR TO MEALS TWICE DAILY.  YOUR BIOPSY RESULTS WILL BE AVAILABLE IN MY CHART APR 26 AND MY OFFICE WILL CONTACT YOU IN 10-14 DAYS WITH YOUR RESULTS.   Follow up in 3 mos.  Next colonoscopy AT AGE 35 WITH PROPOFOL.   ENDOSCOPY Care After Read the instructions outlined below and refer to this sheet in the next week. These discharge instructions provide you with general information on caring for yourself after you leave the hospital. While your treatment has been planned according to the most current medical practices available, unavoidable complications occasionally occur. If you have any problems or questions after discharge, call DR. Seleni Meller, [phone].  ACTIVITY  You may resume your regular activity, but move at a slower pace for the next 24 hours.   Take frequent  rest periods for the next 24 hours.   Walking will help get rid of the air and reduce the bloated feeling in your belly (abdomen).   No driving for 24 hours (because of the medicine (anesthesia) used during the test).   You may shower.   Do not sign any important legal documents or operate any machinery for 24 hours (because of the anesthesia used during the test).    NUTRITION  Drink plenty of fluids.   You may resume your normal diet as instructed by your doctor.   Begin with a light meal and progress to your normal diet. Heavy or fried foods are harder to digest and may make you feel sick to your stomach (nauseated).   Avoid alcoholic beverages for 24 hours or as instructed.    MEDICATIONS  You may resume your normal medications.   WHAT YOU CAN EXPECT TODAY  Some feelings of bloating in the abdomen.   Passage of more gas than usual.   Spotting of blood in your stool or on the toilet paper  .  IF YOU HAD POLYPS REMOVED DURING THE ENDOSCOPY:  Eat a soft diet IF YOU HAVE NAUSEA, BLOATING, ABDOMINAL PAIN, OR VOMITING.    FINDING OUT THE RESULTS OF YOUR TEST Not all test results are available during your visit. DR. Oneida Alar WILL CALL YOU WITHIN 14 DAYS OF YOUR PROCEDUE WITH YOUR RESULTS. Do not assume everything is normal if you have not heard from DR. Searra Carnathan, CALL HER OFFICE AT [phone].  SEEK IMMEDIATE MEDICAL ATTENTION AND CALL THE OFFICE: [phone] IF:  You  have more than a spotting of blood in your stool.   Your belly is swollen (abdominal distention).   You are nauseated or vomiting.   You have a temperature over 101F.   You have abdominal pain or discomfort that is severe or gets worse throughout the day.   High-Fiber Diet A high-fiber diet changes your normal diet to include more whole grains, legumes, fruits, and vegetables. Changes in the diet involve replacing refined carbohydrates with unrefined foods. The calorie level of the diet is  essentially unchanged. The Dietary Reference Intake (recommended amount) for adult males is 38 grams per day. For adult females, it is 25 grams per day. Pregnant and lactating women should consume 28 grams of fiber per day. Fiber is the intact part of a plant that is not broken down during digestion. Functional fiber is fiber that has been isolated from the plant to provide a beneficial effect in the body. PURPOSE  Increase stool bulk.   Ease and regulate bowel movements.   Lower cholesterol.  REDUCE RISK OF COLON CANCER  INDICATIONS THAT YOU NEED MORE FIBER  Constipation and hemorrhoids.   Uncomplicated diverticulosis (intestine condition) and irritable bowel syndrome.   Weight management.   As a protective measure against hardening of the arteries (atherosclerosis), diabetes, and cancer.   GUIDELINES FOR INCREASING FIBER IN THE DIET  Start adding fiber to the diet slowly. A gradual increase of about 5 more grams (2 slices of whole-wheat bread, 2 servings of most fruits or vegetables, or 1 bowl of high-fiber cereal) per day is best. Too rapid an increase in fiber may result in constipation, flatulence, and bloating.   Drink enough water and fluids to keep your urine clear or pale yellow. Water, juice, or caffeine-free drinks are recommended. Not drinking enough fluid may cause constipation.   Eat a variety of high-fiber foods rather than one type of fiber.   Try to increase your intake of fiber through using high-fiber foods rather than fiber pills or supplements that contain small amounts of fiber.   The goal is to change the types of food eaten. Do not supplement your present diet with high-fiber foods, but replace foods in your present diet.   INCLUDE A VARIETY OF FIBER SOURCES  Replace refined and processed grains with whole grains, canned fruits with fresh fruits, and incorporate other fiber sources. White rice, white breads, and most bakery goods contain little or no fiber.     Brown whole-grain rice, buckwheat oats, and many fruits and vegetables are all good sources of fiber. These include: broccoli, Brussels sprouts, cabbage, cauliflower, beets, sweet potatoes, white potatoes (skin on), carrots, tomatoes, eggplant, squash, berries, fresh fruits, and dried fruits.   Cereals appear to be the richest source of fiber. Cereal fiber is found in whole grains and bran. Bran is the fiber-rich outer coat of cereal grain, which is largely removed in refining. In whole-grain cereals, the bran remains. In breakfast cereals, the largest amount of fiber is found in those with "bran" in their names. The fiber content is sometimes indicated on the label.   You may need to include additional fruits and vegetables each day.   In baking, for 1 cup white flour, you may use the following substitutions:   1 cup whole-wheat flour minus 2 tablespoons.   1/2 cup white flour plus 1/2 cup whole-wheat flour.   Low-Fat Diet BREADS, CEREALS, PASTA, RICE, DRIED PEAS, AND BEANS These products are high in carbohydrates and most are low in  fat. Therefore, they can be increased in the diet as substitutes for fatty foods. They too, however, contain calories and should not be eaten in excess. Cereals can be eaten for snacks as well as for breakfast.   FRUITS AND VEGETABLES It is good to eat fruits and vegetables. Besides being sources of fiber, both are rich in vitamins and some minerals. They help you get the daily allowances of these nutrients. Fruits and vegetables can be used for snacks and desserts.  MEATS Limit lean meat, chicken, [place], and fish to no more than 6 ounces per day. Beef, Pork, and Lamb Use lean cuts of beef, pork, and lamb. Lean cuts include:  Extra-lean ground beef.  Arm roast.  Sirloin tip.  Center-cut ham.  Round steak.  Loin chops.  Rump roast.  Tenderloin.  Trim all fat off the outside of meats before cooking. It is not necessary to severely decrease the intake  of red meat, but lean choices should be made. Lean meat is rich in protein and contains a highly absorbable form of iron. Premenopausal women, in particular, should avoid reducing lean red meat because this could increase the risk for low red blood cells (iron-deficiency anemia).  Chicken and [place] These are good sources of protein. The fat of poultry can be reduced by removing the skin and underlying fat layers before cooking. Chicken and [place] can be substituted for lean red meat in the diet. Poultry should not be fried or covered with high-fat sauces. Fish and Shellfish Fish is a good source of protein. Shellfish contain cholesterol, but they usually are low in saturated fatty acids. The preparation of fish is important. Like chicken and [place], they should not be fried or covered with high-fat sauces. EGGS Egg whites contain no fat or cholesterol. They can be eaten often. Try 1 to 2 egg whites instead of whole eggs in recipes or use egg substitutes that do not contain yolk. MILK AND DAIRY PRODUCTS Use skim or 1% milk instead of 2% or whole milk. Decrease whole milk, natural, and processed cheeses. Use nonfat or low-fat (2%) cottage cheese or low-fat cheeses made from vegetable oils. Choose nonfat or low-fat (1 to 2%) yogurt. Experiment with evaporated skim milk in recipes that call for heavy cream. Substitute low-fat yogurt or low-fat cottage cheese for sour cream in dips and salad dressings. Have at least 2 servings of low-fat dairy products, such as 2 glasses of skim (or 1%) milk each day to help get your daily calcium intake. FATS AND OILS Reduce the total intake of fats, especially saturated fat. Butterfat, lard, and beef fats are high in saturated fat and cholesterol. These should be avoided as much as possible. Vegetable fats do not contain cholesterol, but certain vegetable fats, such as coconut oil, palm oil, and palm kernel oil are very high in saturated fats. These should be limited. These  fats are often used in bakery goods, processed foods, popcorn, oils, and nondairy creamers. Vegetable shortenings and some peanut butters contain hydrogenated oils, which are also saturated fats. Read the labels on these foods and check for saturated vegetable oils. Unsaturated vegetable oils and fats do not raise blood cholesterol. However, they should be limited because they are fats and are high in calories. Total fat should still be limited to 30% of your daily caloric intake. Desirable liquid vegetable oils are corn oil, cottonseed oil, olive oil, canola oil, safflower oil, soybean oil, and sunflower oil. Peanut oil is not as good, but small amounts  are acceptable. Buy a heart-healthy tub margarine that has no partially hydrogenated oils in the ingredients. Mayonnaise and salad dressings often are made from unsaturated fats, but they should also be limited because of their high calorie and fat content. Seeds, nuts, peanut butter, olives, and avocados are high in fat, but the fat is mainly the unsaturated type. These foods should be limited mainly to avoid excess calories and fat. OTHER EATING TIPS Snacks  Most sweets should be limited as snacks. They tend to be rich in calories and fats, and their caloric content outweighs their nutritional value. Some good choices in snacks are graham crackers, melba toast, soda crackers, bagels (no egg), English muffins, fruits, and vegetables. These snacks are preferable to snack crackers, [place] fries, TORTILLA CHIPS, and POTATO chips. Popcorn should be air-popped or cooked in small amounts of liquid vegetable oil. Desserts Eat fruit, low-fat yogurt, and fruit ices instead of pastries, cake, and cookies. Sherbet, angel food cake, gelatin dessert, frozen low-fat yogurt, or other frozen products that do not contain saturated fat (pure fruit juice bars, frozen ice pops) are also acceptable.  COOKING METHODS Choose those methods that use little or no fat. They  include: Poaching.  Braising.  Steaming.  Grilling.  Baking.  Stir-frying.  Broiling.  Microwaving.  Foods can be cooked in a nonstick pan without added fat, or use a nonfat cooking spray in regular cookware. Limit fried foods and avoid frying in saturated fat. Add moisture to lean meats by using water, broth, cooking wines, and other nonfat or low-fat sauces along with the cooking methods mentioned above. Soups and stews should be chilled after cooking. The fat that forms on top after a few hours in the refrigerator should be skimmed off. When preparing meals, avoid using excess salt. Salt can contribute to raising blood pressure in some people.  EATING AWAY FROM HOME Order entres, potatoes, and vegetables without sauces or butter. When meat exceeds the size of a deck of cards (3 to 4 ounces), the rest can be taken home for another meal. Choose vegetable or fruit salads and ask for low-calorie salad dressings to be served on the side. Use dressings sparingly. Limit high-fat toppings, such as bacon, crumbled eggs, cheese, sunflower seeds, and olives. Ask for heart-healthy tub margarine instead of butter.   Lifestyle and home remedies TO MANAGE REFLUX  You may eliminate or reduce the frequency of heartburn by making the following lifestyle changes:   Control your weight. Being overweight is a major risk factor for heartburn and GERD. Excess pounds put pressure on your abdomen, pushing up your stomach and causing acid to back up into your esophagus.    Eat smaller meals. 4 TO 6 MEALS A DAY. This reduces pressure on the lower esophageal sphincter, helping to prevent the valve from opening and acid from washing back into your esophagus.    Loosen your belt. Clothes that fit tightly around your waist put pressure on your abdomen and the lower esophageal sphincter.    Eliminate heartburn triggers. Everyone has specific triggers. Common triggers such as fatty or fried foods, spicy food,  tomato sauce, carbonated beverages, alcohol, chocolate, mint, garlic, onion, caffeine and nicotine may make heartburn worse.    Avoid stooping or bending. Tying your shoes is OK. Bending over for longer periods to weed your garden isn't, especially soon after eating.    Don't lie down after a meal. Wait at least three to four hours after eating before going to bed, and don't  lie down right after eating.    PUT THE HEAD OF YOUR BED ON 6 INCH BLOCKS.   Alternative medicine  Several home remedies exist for treating GERD, but they provide only temporary relief. They include drinking baking soda (sodium bicarbonate) added to water or drinking other fluids such as baking soda mixed with cream of tartar and water.   Although these liquids create temporary relief by neutralizing, washing away or buffering acids, eventually they aggravate the situation by adding gas and fluid to your stomach, increasing pressure and causing more acid reflux. Further, adding more sodium to your diet may increase your blood pressure and add stress to your heart, and excessive bicarbonate ingestion can alter the acid-base balance in your body.    Hemorrhoids Hemorrhoids are dilated (enlarged) veins around the rectum. Sometimes clots will form in the veins. This makes them swollen and painful. These are called thrombosed hemorrhoids. Causes of hemorrhoids include:  Constipation.   Straining to have a bowel movement.   HEAVY LIFTING  HOME CARE INSTRUCTIONS  Eat a well balanced diet and drink 6 to 8 glasses of water every day to avoid constipation. You may also use a bulk laxative.   Avoid straining to have bowel movements.   Keep anal area dry and clean.   Do not use a donut shaped pillow or sit on the toilet for long periods. This increases blood pooling and pain.   Move your bowels when your body has the urge; this will require less straining and will decrease pain and pressure.

## 2015-11-09 NOTE — Op Note (Signed)
[facility] Patient Name: Javiana Weppler Procedure Date: 11/08/2015 10:42 AM MRN: [id number] Date of Birth: Jul 04, 1981 Attending MD: Barney Drain , MD CSN: [id number] Age: 35 Admit Type: Outpatient Procedure:                Upper GI endoscopy Indications:              Dysphagia, Heartburn Providers:                Barney Drain, MD, Renda Rolls, RN, Randa Spike,                            Technician Referring MD:             Halford Chessman Medicines:                Meperidine 25 mg IV + TCS Complications:            No immediate complications. Estimated Blood Loss:     Estimated blood loss was minimal. Procedure:                Pre-Anesthesia Assessment:                           - Prior to the procedure, a History and Physical                            was performed, and patient medications and                            allergies were reviewed. The patient's tolerance of                            previous anesthesia was also reviewed. The risks                            and benefits of the procedure and the sedation                            options and risks were discussed with the patient.                            All questions were answered, and informed consent                            was obtained. Prior Anticoagulants: The patient has                            taken ibuprofen, last dose was day of procedure.                            ASA Grade Assessment: II - A patient with mild                            systemic disease. After reviewing the risks and  benefits, the patient was deemed in satisfactory                            condition to undergo the procedure.                           After obtaining informed consent, the endoscope was                            passed under direct vision. Throughout the                            procedure, the patient's blood pressure, pulse, and                            oxygen saturations  were monitored continuously. The                            [id number]) scope was introduced through the                            mouth, and advanced to the second part of duodenum.                            The upper GI endoscopy was somewhat difficult due                            to the patient's agitation. The patient tolerated                            the procedure. Scope In: Scope Out: 10:54:37 AM Findings:      A web was found in the proximal esophagus. A guidewire was placed and       the scope was withdrawn. Dilation was performed with a Savary dilator       with no resistance at 16 mm.      A few small sessile polyps were found in the gastric fundus. This was       biopsied with a cold forceps for histology.      Scattered minimal inflammation characterized by congestion (edema) and       erythema was found in the gastric antrum. Biopsies were taken with a       cold forceps for Helicobacter pylori testing.      The duodenal bulb and second portion of the duodenum were normal. 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                           - A few gastric polyps. Biopsied.                           - Gastritis. Biopsied.                           - Normal duodenal bulb and second  portion of the                            duodenum. Moderate Sedation:      Moderate (conscious) sedation was administered by the endoscopy nurse       and supervised by the endoscopist. The following parameters were       monitored: oxygen saturation, heart rate, blood pressure, and response       to care. Total physician intraservice time was 44 minutes. Recommendation:           - Patient has a contact number available for                            emergencies. The signs and symptoms of potential                            delayed complications were discussed with the                            patient. Return to normal activities tomorrow.                             Written discharge instructions were provided to the                            patient.                           - Low fat diet.                           - Continue present medications.                           - Await pathology results.                           - Return to my office in 3 months.                           BECAUSE OF MODERATE SIZE INTERNAL AND EXTERNAL                            HEMORRHOIDS, SEE A SURGEON OR HEMORRHOIDECTOMY.                           TO BEST CONTROL YOUR REFLUX-1. CONTINUE YOUR WEIGHT                            LOSS EFFORTS. 2. AVOID REFLUX TRIGGERS AND 3. USE                            PROTONIX OR OMEPRAZOLE. Marland Kitchen                           START PROTONIX. TAKE 30 MINUTES PRIOR TO MEALS  TWICE DAILY.                           Next colonoscopy AT AGE 38 WITH PROPOFOL. NEXT EGD                            WITH MAC. Procedure Code(s):        --- Professional ---                           [phone], Esophagogastroduodenoscopy, flexible,                            transoral; with insertion of guide wire followed by                            passage of dilator(s) through esophagus over guide                            wire                           43239, Esophagogastroduodenoscopy, flexible,                            transoral; with biopsy, single or multiple                           99153, Moderate sedation services; each additional                            15 minutes intraservice time                           99153, Moderate sedation services; each additional                            15 minutes intraservice time                           G0500, Moderate sedation services provided by the                            same physician or other qualified health care                            professional performing a gastrointestinal                            endoscopic service that sedation supports,                             requiring the presence of an independent trained                            observer to assist in the monitoring of the  patient's level of consciousness and physiological                            status; initial 15 minutes of intra-service time;                            patient age 76 years or older (additional time may                            be reported with [phone], as appropriate) Diagnosis Code(s):        --- Professional ---                           Q39.4, Esophageal web                           K31.7, Polyp of stomach and duodenum                           K29.70, Gastritis, unspecified, without bleeding                           R13.10, Dysphagia, unspecified                           R12, Heartburn CPT copyright 2016 [employer]. All rights reserved. The codes documented in this report are preliminary and upon coder review may  be revised to meet current compliance requirements. Barney Drain, MD Barney Drain, MD 11/09/2015 11:34:44 AM This report has been signed electronically. Number of Addenda: 0

## 2015-11-09 NOTE — Op Note (Signed)
[facility] Patient Name: Breanna [name] Procedure Date: 11/08/2015 10:03 AM MRN: [id number] Date of Birth: 1980/10/12 Attending MD: Barney Drain , MD CSN: [id number] Age: 35 Admit Type: Outpatient Procedure:                Colonoscopy Indications:              Hematochezia. PREFERS ANUSOL SUPP BUT INSURANCE                            REQUIRES ~$112 CO-PAY Providers:                Barney Drain, MD, Renda Rolls, RN, Randa Spike,                            Technician Referring MD:              Medicines:                Promethazine 25 mg IV, Meperidine 175 mg IV,                            Midazolam 10 mg IV Complications:            No immediate complications. Estimated Blood Loss:     Estimated blood loss: none. Procedure:                Pre-Anesthesia Assessment:                           - Prior to the procedure, a History and Physical                            was performed, and patient medications and                            allergies were reviewed. The patient's tolerance of                            previous anesthesia was also reviewed. The risks                            and benefits of the procedure and the sedation                            options and risks were discussed with the patient.                            All questions were answered, and informed consent                            was obtained. Prior Anticoagulants: The patient has                            taken ibuprofen. ASA Grade Assessment: II - A  patient with mild systemic disease. After reviewing                            the risks and benefits, the patient was deemed in                            satisfactory condition to undergo the procedure.                           After obtaining informed consent, the colonoscope                            was passed under direct vision. Throughout the                            procedure, the patient's blood pressure,  pulse, and                            oxygen saturations were monitored continuously. The                            EC-[id number] scope was introduced through                            the anus and advanced to the the terminal ileum.                            The terminal ileum, ileocecal valve, appendiceal                            orifice, and rectum were photographed. The                            colonoscopy was technically difficult and complex                            due to the patient's agitation. Successful                            completion of the procedure was aided by increasing                            the dose of sedation medication, changing the                            patient to a supine position and using manual                            pressure. The quality of the bowel preparation was                            excellent. Scope In: 10:27:29 AM Scope Out: 10:40:34 AM Scope Withdrawal Time: 0 hours 7 minutes 48 seconds  Total Procedure Duration: 0  hours 13 minutes 5 seconds  Findings:      The digital rectal exam findings include non-thrombosed external       hemorrhoids.      The terminal ileum appeared normal.      The sigmoid colon and transverse colon were significantly redundant.       Advancing the scope required changing the patient to a supine position       and using manual pressure.      The exam was otherwise without abnormality.      Non-bleeding external and internal hemorrhoids were found. The       hemorrhoids were moderate. Impression:               - Non-thrombosed external hemorrhoids found on                            digital rectal exam.                           - The examined portion of the ileum was normal.                           - Redundant colon.                           - The examination was otherwise normal.                           - Non-bleeding external and internal hemorrhoids.                           - No  specimens collected. Moderate Sedation:      Moderate (conscious) sedation was administered by the endoscopy nurse       and supervised by the endoscopist. The following parameters were       monitored: oxygen saturation, heart rate, blood pressure, and response       to care. Total physician intraservice time was 44 minutes. Recommendation:           - Patient has a contact number available for                            emergencies. The signs and symptoms of potential                            delayed complications were discussed with the                            patient. Return to normal activities tomorrow.                            Written discharge instructions were provided to the                            patient.                           - High fiber diet and low fat diet.                           -  Continue present medications.                           DUE TO MODERATE SIZE INTERNAL AND EXTERNAL                            HEMORRHOIDS, SEE A SURGEON FOR A HEMORRHOIDECTOMY                            OR CONTINUE MEDICAL MANAGEMENT.                           TO BEST CONTROL YOUR REFLUX-1. CONTINUE YOUR WEIGHT                            LOSS EFFORTS. 2. AVOID REFLUX TRIGGERS AND 3. USE                            PROTONIX OR OMEPRAZOLE.                           START PROTONIX. TAKE 30 MINUTES PRIOR TO MEALS                            TWICE DAILY.                           Follow up in 3 mos.                           Next colonoscopy AT AGE 41 WITH PROPOFOL.                           - Return to my office in 3 months.                           - No repeat colonoscopy due to age. Procedure Code(s):        --- Professional ---                           [phone], Colonoscopy, flexible; diagnostic, including                            collection of specimen(s) by brushing or washing,                            when performed (separate procedure)                           99153,  Moderate sedation services; each additional                            15 minutes intraservice time                           99153, Moderate sedation services; each additional  15 minutes intraservice time                           G0500, Moderate sedation services provided by the                            same physician or other qualified health care                            professional performing a gastrointestinal                            endoscopic service that sedation supports,                            requiring the presence of an independent trained                            observer to assist in the monitoring of the                            patient's level of consciousness and physiological                            status; initial 15 minutes of intra-service time;                            patient age 66 years or older (additional time may                            be reported with [phone], as appropriate) Diagnosis Code(s):        --- Professional ---                           K64.4, Residual hemorrhoidal skin tags                           K64.8, Other hemorrhoids                           K92.1, Melena (includes Hematochezia)                           Q43.8, Other specified congenital malformations of                            intestine CPT copyright 2016 [employer]. All rights reserved. The codes documented in this report are preliminary and upon coder review may  be revised to meet current compliance requirements. Barney Drain, MD Barney Drain, MD 11/09/2015 1:35:41 PM This report has been signed electronically. Number of Addenda: 0

## 2015-11-11 ENCOUNTER — Encounter (HOSPITAL_COMMUNITY): Payer: Self-pay | Admitting: Gastroenterology

## 2015-11-28 ENCOUNTER — Telehealth: Payer: Self-pay | Admitting: Gastroenterology

## 2015-11-28 NOTE — Telephone Encounter (Signed)
Please call pt. HER stomach Bx shows mild gastritis. SHE HAD BENIGN POLYPS REMOVED FROM HER STOMACH.    BECAUSE YOU HAVE MODERATE SIZE INTERNAL AND EXTERNAL HEMORRHOIDS, YOU SHOULD SEE A SURGEON TO FIX YOUR HEMORRHOIDS. TO BEST CONTROL YOUR REFLUX-1. CONTINUE YOUR WEIGHT LOSS EFFORTS. 2. AVOID REFLUX TRIGGERS AND 3. USE PROTONIX OR OMEPRAZOLE.   FOLLOW A HIGH FIBER/LOW FAT DIET. AVOID ITEMS THAT CAUSE BLOATING. MEATS SHOULD BE BAKED, BROILED, OR BOILED. AVOID FRIED FOODS.  TAKE PROTONIX. TAKE 30 MINUTES PRIOR TO MEALS TWICE DAILY.   Follow up in 3 mos E30 GASTRITIS/GERD.  Next colonoscopy AT AGE 61 WITH PROPOFOL.

## 2015-11-29 NOTE — Telephone Encounter (Signed)
Pt is aware.  

## 2015-11-29 NOTE — Telephone Encounter (Signed)
APPT MADE

## 2016-02-09 ENCOUNTER — Ambulatory Visit: Payer: Commercial Managed Care - HMO | Admitting: Gastroenterology

## 2016-06-29 ENCOUNTER — Other Ambulatory Visit (HOSPITAL_COMMUNITY)
Admission: RE | Admit: 2016-06-29 | Discharge: 2016-06-29 | Disposition: A | Discharge status: Home / Self Care | Payer: Commercial Managed Care - HMO | Source: Ambulatory Visit | Attending: Advanced Practice Midwife | Admitting: Advanced Practice Midwife

## 2016-06-29 ENCOUNTER — Ambulatory Visit (INDEPENDENT_AMBULATORY_CARE_PROVIDER_SITE_OTHER): Payer: Commercial Managed Care - HMO | Admitting: Advanced Practice Midwife

## 2016-06-29 ENCOUNTER — Encounter: Payer: Self-pay | Admitting: Advanced Practice Midwife

## 2016-06-29 VITALS — BP 118/76 | HR 70 | Ht 65.0 in | Wt 253.0 lb

## 2016-06-29 DIAGNOSIS — Z1151 Encounter for screening for human papillomavirus (HPV): Secondary | ICD-10-CM | POA: Diagnosis not present

## 2016-06-29 DIAGNOSIS — Z01419 Encounter for gynecological examination (general) (routine) without abnormal findings: Secondary | ICD-10-CM

## 2016-06-29 NOTE — Progress Notes (Signed)
[place] C Weill 35 y.o.  Vitals:   06/29/16 0844  BP: 118/76  Pulse: 70     Filed Weights   06/29/16 0844  Weight: 253 lb (114.8 kg)    Past Medical History: Past Medical History:  Diagnosis Date  . Allergy   . Anxiety   . Breast nodule 05/21/2014  . GERD (gastroesophageal reflux disease)   . Obesity   . Pain of both breasts 05/21/2014  . Pseudotumor cerebri syndrome    takes diamox    Past Surgical History: Past Surgical History:  Procedure Laterality Date  . BIOPSY  11/08/2015   Procedure: BIOPSY;  Surgeon: Danie Binder, MD;  Location: [facility];  Service: Endoscopy;;  Gastric bx and Gastric polyp bx  . CESAREAN SECTION     x 2  . CHOLECYSTECTOMY     age 66  . COLONOSCOPY N/A 11/08/2015   Procedure: COLONOSCOPY;  Surgeon: Danie Binder, MD;  Location: [facility];  Service: Endoscopy;  Laterality: N/A;  1000  . ESOPHAGOGASTRODUODENOSCOPY N/A 11/08/2015   Procedure: ESOPHAGOGASTRODUODENOSCOPY (EGD);  Surgeon: Danie Binder, MD;  Location: [facility];  Service: Endoscopy;  Laterality: N/A;  . HEMORRHOID BANDING N/A 11/08/2015   Procedure: HEMORRHOID BANDING;  Surgeon: Danie Binder, MD;  Location: [facility];  Service: Endoscopy;  Laterality: N/A;  . SAVORY DILATION N/A 11/08/2015   Procedure: SAVORY DILATION;  Surgeon: Danie Binder, MD;  Location: [facility];  Service: Endoscopy;  Laterality: N/A;  . WISDOM TOOTH EXTRACTION      Family History: Family History  Problem Relation Age of Onset  . COPD Mother   . Pneumonia Mother   . Hyperlipidemia Father   . Heart disease Maternal Aunt   . Hypertension Maternal Grandmother   . Diabetes Maternal Grandmother   . Stroke Paternal Grandmother   . Hypertension Paternal Grandmother   . Diabetes Paternal Grandmother   . Stomach cancer Paternal Grandmother   . Diabetes Paternal Grandfather   . Heart disease Paternal Grandfather   . Hyperlipidemia Paternal Grandfather   . Hypertension Paternal  Grandfather   . Colon cancer Neg Hx     Social History: Social History  Substance Use Topics  . Smoking status: Never Smoker  . Smokeless tobacco: Never Used  . Alcohol use No    Allergies:  Allergies  Allergen Reactions  . Codeine Itching and Rash  . Latex Itching and Rash  . Penicillins Itching and Rash    Has patient had a PCN reaction causing immediate rash, facial/tongue/throat swelling, SOB or lightheadedness with hypotension: Yes Has patient had a PCN reaction causing severe rash involving mucus membranes or skin necrosis: No Has patient had a PCN reaction that required hospitalization No Has patient had a PCN reaction occurring within the last 10 years: No If all of the above answers are "NO", then may proceed with Cephalosporin use.   . Sulfa Antibiotics Itching and Rash       History of Present Illness: Here for pap and p hysical.  Last pa 2013, normal.  Nexplanon inserted 11/16.  Is  happy with it.  never bleeds.    Review of Systems   Patient denies any headaches, blurred vision, shortness of breath, chest pain, abdominal pain, problems with bowel movements, urination, or intercourse.   Physical Exam: General:  Well developed, well nourished, no acute distress Skin:  Warm and dry Neck:  Midline trachea, normal thyroid Lungs; Clear to auscultation bilaterally Breast:  No dominant palpable mass, retraction, or nipple discharge Cardiovascular: Regular rate and rhythm Abdomen:  Soft, non tender, no hepatosplenomegaly Pelvic:  External genitalia is normal in appearance.  The vagina is normal in appearance.  The cervix is bulbous.  Uterus is felt to be normal size, shape, and contour.  No adnexal masses or tenderness noted.  Extremities:  No swelling or varicosities noted Psych:  No mood changes.     Impression: Normal GYN exam     Plan: If pap normal, repeat q 3 years

## 2016-06-30 LAB — CYTOLOGY - PAP
DIAGNOSIS: NEGATIVE
HPV (WINDOPATH): NOT DETECTED

## 2016-08-15 DIAGNOSIS — B349 Viral infection, unspecified: Secondary | ICD-10-CM | POA: Diagnosis not present

## 2016-08-15 DIAGNOSIS — G932 Benign intracranial hypertension: Secondary | ICD-10-CM | POA: Diagnosis not present

## 2016-09-20 ENCOUNTER — Other Ambulatory Visit: Payer: Self-pay | Admitting: Advanced Practice Midwife

## 2016-09-20 ENCOUNTER — Encounter: Payer: Self-pay | Admitting: Advanced Practice Midwife

## 2016-09-20 MED ORDER — FLUCONAZOLE 150 MG PO TABS: ORAL_TABLET | ORAL | 2 refills | Status: DC

## 2016-09-20 NOTE — Progress Notes (Signed)
Diflucan for yeast

## 2016-11-21 ENCOUNTER — Other Ambulatory Visit: Payer: Self-pay | Admitting: Gastroenterology

## 2017-02-09 DIAGNOSIS — G44209 Tension-type headache, unspecified, not intractable: Secondary | ICD-10-CM | POA: Diagnosis not present

## 2017-02-09 DIAGNOSIS — J329 Chronic sinusitis, unspecified: Secondary | ICD-10-CM | POA: Diagnosis not present

## 2017-03-27 ENCOUNTER — Other Ambulatory Visit: Payer: Self-pay | Admitting: Gastroenterology

## 2017-03-28 DIAGNOSIS — Z79899 Other long term (current) drug therapy: Secondary | ICD-10-CM | POA: Diagnosis not present

## 2017-03-28 DIAGNOSIS — Z049 Encounter for examination and observation for unspecified reason: Secondary | ICD-10-CM | POA: Diagnosis not present

## 2017-03-28 DIAGNOSIS — G43719 Chronic migraine without aura, intractable, without status migrainosus: Secondary | ICD-10-CM | POA: Diagnosis not present

## 2017-03-28 DIAGNOSIS — R51 Headache: Secondary | ICD-10-CM | POA: Diagnosis not present

## 2017-03-30 DIAGNOSIS — G932 Benign intracranial hypertension: Secondary | ICD-10-CM | POA: Diagnosis not present

## 2017-03-30 DIAGNOSIS — J329 Chronic sinusitis, unspecified: Secondary | ICD-10-CM | POA: Diagnosis not present

## 2017-03-30 DIAGNOSIS — J209 Acute bronchitis, unspecified: Secondary | ICD-10-CM | POA: Diagnosis not present

## 2017-04-04 ENCOUNTER — Other Ambulatory Visit: Payer: Self-pay | Admitting: Specialist

## 2017-04-04 DIAGNOSIS — R51 Headache: Principal | ICD-10-CM

## 2017-04-04 DIAGNOSIS — R519 Headache, unspecified: Secondary | ICD-10-CM

## 2017-04-16 ENCOUNTER — Ambulatory Visit
Admission: RE | Admit: 2017-04-16 | Discharge: 2017-04-16 | Disposition: A | Discharge status: Home / Self Care | Payer: 59 | Source: Ambulatory Visit | Attending: Specialist | Admitting: Specialist

## 2017-04-16 DIAGNOSIS — R51 Headache: Principal | ICD-10-CM

## 2017-04-16 DIAGNOSIS — R519 Headache, unspecified: Secondary | ICD-10-CM

## 2017-04-16 NOTE — Discharge Instructions (Signed)

## 2017-04-16 NOTE — Progress Notes (Signed)
One SST tube of blood drawn from left [facility] space without difficulty for LP labs.  Brita Romp, RN

## 2017-04-20 LAB — CRYPTOCOCCAL AG, LTX SCR RFLX TITER
Cryptococcal Ag Screen: NOT DETECTED
MICRO NUMBER: 81085144
SPECIMEN QUALITY:: ADEQUATE

## 2017-04-20 LAB — ANGIOTENSIN CONVERTING ENZYME, CSF: ACE, CSF: 7 U/L (ref <=15)

## 2017-04-20 LAB — B. BURGDORFI ANTIBODIES, CSF
ALBUMIN RATIO: 0.0023
INTERPRETATION: NEGATIVE
Lyme Ab: <1

## 2017-04-20 LAB — CSF CELL COUNT WITH DIFFERENTIAL
RBC Count, CSF: 4 cells/uL (ref 0–10)
WBC, CSF: 0 cells/uL (ref 0–5)

## 2017-04-20 LAB — VDRL, CSF: SYPHILIS VDRL QUANT CSF: NONREACTIVE

## 2017-04-20 LAB — GLUCOSE, CSF: Glucose, CSF: 60 mg/dL (ref 40–80)

## 2017-04-20 LAB — PROTEIN, CSF: Total Protein, CSF: 28 mg/dL (ref 15–45)

## 2017-04-20 LAB — HERPES SIMPLEX VIRUS 1/2 (IGG), CSF
HSV 1 IgG Index:: <0.01
HSV 2 IgG Index:: <0.01

## 2017-04-21 LAB — CSF CULTURE
MICRO NUMBER: 81085149
SPECIMEN QUALITY: ADEQUATE

## 2017-04-21 LAB — CSF CULTURE W GRAM STAIN: Result:: NO GROWTH

## 2017-05-04 DIAGNOSIS — G43719 Chronic migraine without aura, intractable, without status migrainosus: Secondary | ICD-10-CM | POA: Diagnosis not present

## 2017-05-09 DIAGNOSIS — N342 Other urethritis: Secondary | ICD-10-CM | POA: Diagnosis not present

## 2017-05-31 LAB — MYCOBACTERIA,CULT W/FLUOROCHROME SMEAR
MICRO NUMBER: 81085152
SMEAR: NONE SEEN
SPECIMEN QUALITY:: ADEQUATE

## 2017-09-28 DIAGNOSIS — Z1389 Encounter for screening for other disorder: Secondary | ICD-10-CM | POA: Diagnosis not present

## 2017-09-28 DIAGNOSIS — J069 Acute upper respiratory infection, unspecified: Secondary | ICD-10-CM | POA: Diagnosis not present

## 2017-09-28 DIAGNOSIS — J301 Allergic rhinitis due to pollen: Secondary | ICD-10-CM | POA: Diagnosis not present

## 2017-12-11 DIAGNOSIS — M545 Low back pain: Secondary | ICD-10-CM | POA: Diagnosis not present

## 2017-12-11 DIAGNOSIS — R7309 Other abnormal glucose: Secondary | ICD-10-CM | POA: Diagnosis not present

## 2017-12-11 DIAGNOSIS — Z6841 Body Mass Index (BMI) 40.0 and over, adult: Secondary | ICD-10-CM | POA: Diagnosis not present

## 2018-04-02 DIAGNOSIS — J029 Acute pharyngitis, unspecified: Secondary | ICD-10-CM | POA: Diagnosis not present

## 2018-04-02 DIAGNOSIS — Z6841 Body Mass Index (BMI) 40.0 and over, adult: Secondary | ICD-10-CM | POA: Diagnosis not present

## 2018-04-02 DIAGNOSIS — J3489 Other specified disorders of nose and nasal sinuses: Secondary | ICD-10-CM | POA: Diagnosis not present

## 2018-04-18 ENCOUNTER — Ambulatory Visit: Payer: 59 | Admitting: Adult Health

## 2018-04-18 ENCOUNTER — Encounter: Payer: Self-pay | Admitting: Adult Health

## 2018-04-18 VITALS — BP 138/76 | HR 91 | Ht 65.0 in | Wt 256.0 lb

## 2018-04-18 DIAGNOSIS — N6323 Unspecified lump in the left breast, lower outer quadrant: Secondary | ICD-10-CM

## 2018-04-18 NOTE — Progress Notes (Addendum)
  Subjective:     Patient ID: Breanna [name], female   DOB: January 13, 1981, 37 y.o.   MRN: [id number]  HPI Breanna [name] is a 37 year old white female, in complaining of left breast mass, and breasts hurt at times. She has nexplanon.   Review of Systems Left breast mass +breast pain at times  Shoulders ache Reviewed past medical,surgical, social and family history. Reviewed medications and allergies.     Objective:   Physical Exam BP 138/76 (BP Location: Right Arm, Patient Position: Sitting, Cuff Size: Normal)   Pulse 91   Ht 5\' 5"  (1.651 m)   Wt 256 lb (116.1 kg)   BMI 42.60 kg/m   Skin warm and dry,  Breasts:no dominate palpable mass, retraction or nipple discharge on right, on left, no retraction or nipple discharge, has regular irregularities inner lower quadrant, and oval,mobile mass at 5 o'clock near areola.Had verbal consent to do exam without chaperon.  She does not wear under wire.  Will get diagnostic mammogram and USs. Face time 15 minutes with 50% counseling and coordinating care.    Assessment:     1. Mass of lower outer quadrant of left breast       Plan:     Orders Placed This Encounter  Procedures  . MM DIAG BREAST TOMO BILATERAL  . US BREAST LTD UNI RIGHT INC AXILLA  . US BREAST LTD UNI LEFT INC AXILLA  Scheduled at [place] 10.8 at 1:30 pm  F/U prn Decrease caffeine Can try B6 25 mg 1 tid

## 2018-04-22 ENCOUNTER — Encounter

## 2018-04-22 ENCOUNTER — Ambulatory Visit: Payer: 59 | Admitting: Gastroenterology

## 2018-04-23 ENCOUNTER — Ambulatory Visit (HOSPITAL_COMMUNITY): Payer: 59

## 2018-04-23 ENCOUNTER — Other Ambulatory Visit: Payer: Self-pay | Admitting: Adult Health

## 2018-04-23 ENCOUNTER — Ambulatory Visit (HOSPITAL_COMMUNITY)
Admission: RE | Admit: 2018-04-23 | Discharge: 2018-04-23 | Disposition: A | Discharge status: Home / Self Care | Payer: 59 | Source: Ambulatory Visit | Attending: Adult Health | Admitting: Adult Health

## 2018-04-23 DIAGNOSIS — R928 Other abnormal and inconclusive findings on diagnostic imaging of breast: Secondary | ICD-10-CM | POA: Diagnosis not present

## 2018-04-23 DIAGNOSIS — N6323 Unspecified lump in the left breast, lower outer quadrant: Secondary | ICD-10-CM

## 2018-04-23 DIAGNOSIS — N6322 Unspecified lump in the left breast, upper inner quadrant: Secondary | ICD-10-CM | POA: Diagnosis not present

## 2018-04-24 ENCOUNTER — Telehealth: Payer: Self-pay | Admitting: Adult Health

## 2018-04-24 NOTE — Telephone Encounter (Signed)
Pt aware of mammogram and [place] and is getting biopsy 10/22

## 2018-05-07 ENCOUNTER — Ambulatory Visit (HOSPITAL_COMMUNITY)
Admission: RE | Admit: 2018-05-07 | Discharge: 2018-05-07 | Disposition: A | Discharge status: Home / Self Care | Payer: 59 | Source: Ambulatory Visit | Attending: Adult Health | Admitting: Adult Health

## 2018-05-07 ENCOUNTER — Other Ambulatory Visit: Payer: Self-pay | Admitting: Adult Health

## 2018-05-07 DIAGNOSIS — C50212 Malignant neoplasm of upper-inner quadrant of left female breast: Secondary | ICD-10-CM | POA: Diagnosis not present

## 2018-05-07 DIAGNOSIS — N6323 Unspecified lump in the left breast, lower outer quadrant: Secondary | ICD-10-CM

## 2018-05-07 DIAGNOSIS — R928 Other abnormal and inconclusive findings on diagnostic imaging of breast: Secondary | ICD-10-CM

## 2018-05-07 DIAGNOSIS — Z170 Estrogen receptor positive status [ER+]: Secondary | ICD-10-CM | POA: Diagnosis not present

## 2018-05-07 DIAGNOSIS — D0512 Intraductal carcinoma in situ of left breast: Secondary | ICD-10-CM | POA: Insufficient documentation

## 2018-05-07 DIAGNOSIS — N6322 Unspecified lump in the left breast, upper inner quadrant: Secondary | ICD-10-CM | POA: Diagnosis not present

## 2018-05-07 HISTORY — PX: BREAST BIOPSY: SHX20

## 2018-05-07 MED ORDER — LIDOCAINE HCL (PF) 2 % IJ SOLN
INTRAMUSCULAR | Status: AC
  Administered 2018-05-07: 20 mL
  Filled 2018-05-07: qty 10

## 2018-05-07 MED ORDER — LIDOCAINE-EPINEPHRINE (PF) 1 %-1:200000 IJ SOLN
INTRAMUSCULAR | Status: AC
  Administered 2018-05-07: 09:00:00
  Filled 2018-05-07: qty 30

## 2018-05-07 MED ORDER — LIDOCAINE HCL (PF) 2 % IJ SOLN
INTRAMUSCULAR | Status: AC
  Administered 2018-05-07: 09:00:00
  Filled 2018-05-07: qty 10

## 2018-05-07 MED ORDER — SODIUM BICARBONATE 4 % IV SOLN
INTRAVENOUS | Status: AC
  Administered 2018-05-07: 5 mL
  Filled 2018-05-07: qty 5

## 2018-05-13 ENCOUNTER — Telehealth: Payer: Self-pay | Admitting: Adult Health

## 2018-05-13 NOTE — Telephone Encounter (Signed)
Called to about recent diagnosis of breast cancer and to offer support.

## 2018-05-16 DIAGNOSIS — K219 Gastro-esophageal reflux disease without esophagitis: Secondary | ICD-10-CM | POA: Diagnosis not present

## 2018-05-16 DIAGNOSIS — G932 Benign intracranial hypertension: Secondary | ICD-10-CM | POA: Diagnosis not present

## 2018-05-16 DIAGNOSIS — C50212 Malignant neoplasm of upper-inner quadrant of left female breast: Secondary | ICD-10-CM | POA: Diagnosis not present

## 2018-05-17 ENCOUNTER — Encounter: Payer: Self-pay | Admitting: Oncology

## 2018-05-17 ENCOUNTER — Telehealth: Payer: Self-pay | Admitting: Obstetrics & Gynecology

## 2018-05-17 DIAGNOSIS — C50919 Malignant neoplasm of unspecified site of unspecified female breast: Secondary | ICD-10-CM

## 2018-05-17 HISTORY — DX: Malignant neoplasm of unspecified site of unspecified female breast: C50.919

## 2018-05-17 NOTE — Telephone Encounter (Signed)
Appt 11/13 with Manus Gunning for nexplanon  Removal, use condoms, can check insurance and get handout on paragard, but is getting genetic testing, may wants hyst.

## 2018-05-17 NOTE — Telephone Encounter (Signed)
Patient called and scheduled an appointment for 05/29/18 for Implanon removal.  She is wanted to also talk to [place] regarding a plan for IUD.  [phone]

## 2018-05-20 ENCOUNTER — Other Ambulatory Visit: Payer: Self-pay | Admitting: General Surgery

## 2018-05-20 DIAGNOSIS — C50212 Malignant neoplasm of upper-inner quadrant of left female breast: Secondary | ICD-10-CM

## 2018-05-20 NOTE — Progress Notes (Signed)
Location of Breast Cancer: Left Breast  Histology per Pathology Report:  05/07/18 Diagnosis 1. Breast, left, needle core biopsy, 9:30 3 cm from the nipple - INVASIVE MAMMARY CARCINOMA, GRADE II. SEE NOTE. - MAMMARY CARCINOMA IN SITU.  1. Immunostain for E-cadherin shows relatively faint, cytoplasmic and somewhat membranous staining, suggestive of a ductal phenotype.  Receptor Status: ER(80%), PR(80%), Her2-neu (NEG) Ki- (10%)  2. Breast, left, needle core biopsy, 9:30 2 cm from the nipple - INVASIVE DUCTAL CARCINOMA, GRADE I. SEE NOTE. - MAMMARY CARCINOMA IN SITU.  2. Immunostain for E-cadherin shows strong membranous staining, consistent with a ductal phenotype.  Receptor Status: ER(90%), PR (90%), Her2-neu (NEG), Ki-(10%)  Did patient present with symptoms or was this found on screening mammography?: She presented to her PCP on 04/18/18 and reported palpable left breast mass. She was then sent for a mammogram.   Past/Anticipated interventions by surgeon, if any: Dr. Dalbert Batman saw on 05/16/18 Plans: Med Oncology, Radiation Oncology, Genetic referral.  Bilateral MRI (05/29/18) Lumpectomy vs. Mastectomy, will see again in 2 weeks (06/06/18)  Past/Anticipated interventions by medical oncology, if any:  Consult with Dr. Jana Hakim later today.   Lymphedema issues, if any:  N/A  Pain issues, if any: She denies.   SAFETY ISSUES:  Prior radiation? No  Pacemaker/ICD? No  Possible current pregnancy? She has a implant in place until 05/29/18. She will use alternative birth control then.   Is the patient on methotrexate? No  Current Complaints / other details:  Uterine implant removal 05/29/18.    BP 112/73 (BP Location: Right Arm, Patient Position: Sitting)   Temp 99.4 F (37.4 C) (Oral)   Resp 18   Ht '5\' 5"'$  (1.651 m)   Wt 259 lb 6.4 oz (117.7 kg)   SpO2 100%   BMI 43.17 kg/m    Wt Readings from Last 3 Encounters:  05/22/18 259 lb 6.4 oz (117.7 kg)  04/18/18 256 lb  (116.1 kg)  06/29/16 253 lb (114.8 kg)      Vinie Charity, Stephani Police, RN 05/20/2018,9:25 AM

## 2018-05-21 ENCOUNTER — Other Ambulatory Visit: Payer: Self-pay | Admitting: *Deleted

## 2018-05-21 DIAGNOSIS — N644 Mastodynia: Secondary | ICD-10-CM

## 2018-05-21 DIAGNOSIS — N63 Unspecified lump in unspecified breast: Secondary | ICD-10-CM

## 2018-05-22 ENCOUNTER — Other Ambulatory Visit: Payer: Self-pay

## 2018-05-22 ENCOUNTER — Ambulatory Visit
Admission: RE | Admit: 2018-05-22 | Discharge: 2018-05-22 | Disposition: A | Discharge status: Home / Self Care | Payer: 59 | Source: Ambulatory Visit | Attending: Radiation Oncology | Admitting: Radiation Oncology

## 2018-05-22 ENCOUNTER — Encounter: Payer: Self-pay | Admitting: Oncology

## 2018-05-22 ENCOUNTER — Inpatient Hospital Stay (HOSPITAL_BASED_OUTPATIENT_CLINIC_OR_DEPARTMENT_OTHER): Payer: 59 | Admitting: Genetics

## 2018-05-22 ENCOUNTER — Encounter: Payer: Self-pay | Admitting: Radiation Oncology

## 2018-05-22 ENCOUNTER — Inpatient Hospital Stay: Payer: 59 | Attending: Oncology | Admitting: Oncology

## 2018-05-22 ENCOUNTER — Inpatient Hospital Stay: Payer: 59

## 2018-05-22 ENCOUNTER — Encounter: Payer: Self-pay | Admitting: Genetics

## 2018-05-22 VITALS — BP 112/73 | Temp 99.4°F | Resp 18 | Ht 65.0 in | Wt 259.4 lb

## 2018-05-22 DIAGNOSIS — Z79899 Other long term (current) drug therapy: Secondary | ICD-10-CM | POA: Insufficient documentation

## 2018-05-22 DIAGNOSIS — C50212 Malignant neoplasm of upper-inner quadrant of left female breast: Secondary | ICD-10-CM | POA: Diagnosis not present

## 2018-05-22 DIAGNOSIS — N644 Mastodynia: Secondary | ICD-10-CM

## 2018-05-22 DIAGNOSIS — Z170 Estrogen receptor positive status [ER+]: Secondary | ICD-10-CM

## 2018-05-22 DIAGNOSIS — Z885 Allergy status to narcotic agent status: Secondary | ICD-10-CM | POA: Diagnosis not present

## 2018-05-22 DIAGNOSIS — Z801 Family history of malignant neoplasm of trachea, bronchus and lung: Secondary | ICD-10-CM

## 2018-05-22 DIAGNOSIS — C50812 Malignant neoplasm of overlapping sites of left female breast: Secondary | ICD-10-CM

## 2018-05-22 DIAGNOSIS — Z882 Allergy status to sulfonamides status: Secondary | ICD-10-CM | POA: Insufficient documentation

## 2018-05-22 DIAGNOSIS — Z800 Family history of malignant neoplasm of digestive organs: Secondary | ICD-10-CM | POA: Diagnosis not present

## 2018-05-22 DIAGNOSIS — Z8051 Family history of malignant neoplasm of kidney: Secondary | ICD-10-CM | POA: Diagnosis not present

## 2018-05-22 DIAGNOSIS — Z923 Personal history of irradiation: Secondary | ICD-10-CM | POA: Insufficient documentation

## 2018-05-22 DIAGNOSIS — Z803 Family history of malignant neoplasm of breast: Secondary | ICD-10-CM | POA: Diagnosis not present

## 2018-05-22 DIAGNOSIS — N63 Unspecified lump in unspecified breast: Secondary | ICD-10-CM

## 2018-05-22 DIAGNOSIS — Z880 Allergy status to penicillin: Secondary | ICD-10-CM | POA: Diagnosis not present

## 2018-05-22 DIAGNOSIS — Z8052 Family history of malignant neoplasm of bladder: Secondary | ICD-10-CM

## 2018-05-22 LAB — CBC WITH DIFFERENTIAL (CANCER CENTER ONLY)
Abs Immature Granulocytes: 0.04 10*3/uL (ref 0.00–0.07)
BASOS PCT: 1 %
Basophils Absolute: 0.1 10*3/uL (ref 0.0–0.1)
Eosinophils Absolute: 0.2 10*3/uL (ref 0.0–0.5)
Eosinophils Relative: 2 %
HCT: 44.8 % (ref 36.0–46.0)
Hemoglobin: 15 g/dL (ref 12.0–15.0)
Immature Granulocytes: 0 %
LYMPHS ABS: 1.9 10*3/uL (ref 0.7–4.0)
LYMPHS PCT: 18 %
MCH: 32.1 pg (ref 26.0–34.0)
MCHC: 33.5 g/dL (ref 30.0–36.0)
MCV: 95.7 fL (ref 80.0–100.0)
MONO ABS: 0.7 10*3/uL (ref 0.1–1.0)
MONOS PCT: 6 %
Neutro Abs: 8 10*3/uL — ABNORMAL HIGH (ref 1.7–7.7)
Neutrophils Relative %: 73 %
PLATELETS: 261 10*3/uL (ref 150–400)
RBC: 4.68 MIL/uL (ref 3.87–5.11)
RDW: 12.5 % (ref 11.5–15.5)
WBC Count: 10.9 10*3/uL — ABNORMAL HIGH (ref 4.0–10.5)
nRBC: 0 % (ref 0.0–0.2)

## 2018-05-22 LAB — CMP (CANCER CENTER ONLY)
ALT: 25 U/L (ref 0–44)
AST: 19 U/L (ref 15–41)
Albumin: 3.8 g/dL (ref 3.5–5.0)
Alkaline Phosphatase: 52 U/L (ref 38–126)
Anion gap: 8 (ref 5–15)
BUN: 8 mg/dL (ref 6–20)
CALCIUM: 9.3 mg/dL (ref 8.9–10.3)
CO2: 23 mmol/L (ref 22–32)
CREATININE: 1.05 mg/dL — AB (ref 0.44–1.00)
Chloride: 111 mmol/L (ref 98–111)
Glucose, Bld: 95 mg/dL (ref 70–99)
Potassium: 4.2 mmol/L (ref 3.5–5.1)
Sodium: 142 mmol/L (ref 135–145)
Total Bilirubin: 0.6 mg/dL (ref 0.3–1.2)
Total Protein: 7 g/dL (ref 6.5–8.1)

## 2018-05-22 NOTE — Progress Notes (Signed)
Radiation Oncology         [phone]) [phone] ________________________________  Initial outpatient Consultation  Name: Breanna [name] MRN: [id number]  Date: 05/22/2018  DOB: 24-Sep-1980  IN:[facility], Jenny Reichmann, MD  Fanny Skates, MD   REFERRING PHYSICIAN: Fanny Skates, MD  DIAGNOSIS:    ICD-10-CM   1. Malignant neoplasm of overlapping sites of left breast in female, estrogen receptor positive [facility]) C50.812 Ambulatory referral to Social Work   Z17.0    Cancer Staging Malignant neoplasm of overlapping sites of left breast in female, estrogen receptor positive ([place]) Staging form: Breast, AJCC 8th Edition - Clinical: Stage IA (cT1c, cN0, cM0, G2, ER+, PR+, HER2-) - Signed by Eppie Gibson, MD on 05/27/2018   CHIEF COMPLAINT: Here to discuss management of left breast cancer  HISTORY OF PRESENT ILLNESS::Breanna [name] is a 37 y.o. female who presented with a palpable abnormality in the breast on the left sometime in September and then a breast abnormality on the following imaging: October mammogram and ultrasonography revealed a left breast mass at 930 which measured 1.1 cm; also there was a second lesion in the same portion of the breast not completely evaluated.  The left axilla was negative on ultrasound.  Both suspicious  sites were biopsied on 05/07/2018.  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  Lesions were ER positive and PR positive and HER-2 negative   MRI has been ordered of the breasts to determine if breast conservation is feasible.  She will be seeing genetics.  Dr. Jana Hakim recommended oncotype testing today at tumor board.  The patient is a Marine scientist.  As of now, she wishes to receive her treatment in [place]  She has a hormonal implant in place for birth control but she will have this removed and pursue nonhormonal  strategy for birth control   PREVIOUS RADIATION THERAPY: No  PAST MEDICAL HISTORY:  has a past medical history of Allergy, Anxiety, Breast nodule (05/21/2014), Family history of bladder cancer, Family history of kidney cancer, Family history of lung cancer, Family history of stomach cancer, GERD (gastroesophageal reflux disease), Obesity, Pain of both breasts (05/21/2014), and Pseudotumor cerebri syndrome.    PAST SURGICAL HISTORY: Past Surgical History:  Procedure Laterality Date  . BIOPSY  11/08/2015   Procedure: BIOPSY;  Surgeon: Danie Binder, MD;  [facility];  Service: Endoscopy;;  Gastric bx and Gastric polyp bx  . CESAREAN SECTION     x 2  . CHOLECYSTECTOMY     age 32  . COLONOSCOPY N/A 11/08/2015   Procedure: COLONOSCOPY;  Surgeon: Danie Binder, MD;  [facility];  Service: Endoscopy;  Laterality: N/A;  1000  . ESOPHAGOGASTRODUODENOSCOPY N/A 11/08/2015   Procedure: ESOPHAGOGASTRODUODENOSCOPY (EGD);  Surgeon: Danie Binder, MD;  [facility];  Service: Endoscopy;  Laterality: N/A;  . HEMORRHOID BANDING N/A 11/08/2015   Procedure: HEMORRHOID BANDING;  Surgeon: Danie Binder, MD;  [facility];  Service: Endoscopy;  Laterality: N/A;  . SAVORY DILATION N/A 11/08/2015   Procedure: SAVORY DILATION;  Surgeon: Danie Binder, MD;  [facility];  Service: Endoscopy;  Laterality: N/A;  . WISDOM TOOTH EXTRACTION      FAMILY HISTORY: family history includes Bladder Cancer in her maternal uncle; COPD in her mother; Diabetes in her maternal grandmother, paternal grandfather, and paternal grandmother;  Heart disease in her maternal aunt and paternal grandfather; Hyperlipidemia in her father and paternal grandfather; Hypertension in her maternal grandmother, paternal grandfather, and paternal grandmother; Kidney cancer (age of onset: 16) in her father; Lung cancer in her other; Pneumonia in her mother; Stomach cancer (age of onset: 63) in her  paternal grandmother; Stroke in her paternal grandmother.  SOCIAL HISTORY:  reports that she has never smoked. She has never used smokeless tobacco. She reports that she does not drink alcohol or use drugs.  ALLERGIES: Topamax [topiramate]; Codeine; Latex; Penicillins; and Sulfa antibiotics  MEDICATIONS:  Current Outpatient Medications  Medication Sig Dispense Refill  . acetaminophen (TYLENOL) 500 MG tablet Take 1,000 mg by mouth every 6 (six) hours as needed for mild pain.    Marland Kitchen acetaZOLAMIDE (DIAMOX) 500 MG capsule Take 1 capsule (500 mg total) by mouth 2 (two) times daily. 60 capsule 6  . ALPRAZolam (XANAX) 0.5 MG tablet Take 1 tablet by mouth daily as needed for anxiety.     . busPIRone (BUSPAR) 7.5 MG tablet     . cetirizine (ZYRTEC) 10 MG tablet Take 10 mg by mouth daily.    Marland Kitchen etonogestrel (NEXPLANON) 68 MG IMPL implant 1 each by Subdermal route once. Plans to be removed 05/29/18    . ibuprofen (ADVIL,MOTRIN) 200 MG tablet Take 400 mg by mouth every 6 (six) hours as needed for moderate pain.    . mupirocin ointment (BACTROBAN) 2 % APPLY AA TID  1  . pantoprazole (PROTONIX) 40 MG tablet TAKE 1 TABLET BY MOUTH ONCE DAILY. 30 tablet 5  . zonisamide (ZONEGRAN) 25 MG capsule     . hydrocortisone (ANUSOL-HC) 2.5 % rectal cream Place 1 application rectally 2 (two) times daily as needed for hemorrhoids or itching.    . VENTOLIN HFA 108 (90 Base) MCG/ACT inhaler INHALE 2 PUFFS INTO THE LUNGS Q 4 H PRN  0   No current facility-administered medications for this encounter.     REVIEW OF SYSTEMS: A 10+ POINT REVIEW OF SYSTEMS WAS OBTAINED including neurology, dermatology, psychiatry, cardiac, respiratory, lymph, extremities, GI, GU, Musculoskeletal, constitutional, breasts, reproductive, HEENT.  All pertinent positives are noted in the HPI.  All others are negative.   PHYSICAL EXAM:  height is '5\' 5"'$  (1.651 m) and weight is 259 lb 6.4 oz (117.7 kg). Her oral temperature is 99.4 F (37.4 C). Her  blood pressure is 112/73. Her respiration is 18 and oxygen saturation is 100%.   General: Alert and oriented, in no acute distress HEENT: Head is normocephalic. Extraocular movements are intact. Oropharynx is clear. Neck: Neck is supple, no palpable cervical or supraclavicular lymphadenopathy. Heart: Regular in rate and rhythm with no murmurs, rubs, or gallops. Chest: Clear to auscultation bilaterally, with no rhonchi, wheezes, or rales. Abdomen: Soft, nontender, nondistended, with no rigidity or guarding. Extremities: No cyanosis or edema. Lymphatics: see Neck Exam Skin: No concerning lesions. Musculoskeletal: symmetric strength and muscle tone throughout. Neurologic: Cranial nerves II through XII are grossly intact. No obvious focalities. Speech is fluent. Coordination is intact. Psychiatric: Judgment and insight are intact. Affect is appropriate. Breasts: There is some thickening related to biopsies in left breast. No other palpable masses appreciated in the breasts or axillae bilaterally    ECOG = 0  0 - Asymptomatic (Fully active, able to carry on all predisease activities without restriction)  1 - Symptomatic but completely ambulatory (Restricted in physically strenuous activity but ambulatory and able to carry out work of a light or sedentary  nature. For example, light housework, office work)  2 - Symptomatic, <50% in bed during the day (Ambulatory and capable of all self care but unable to carry out any work activities. Up and about more than 50% of waking hours)  3 - Symptomatic, >50% in bed, but not bedbound (Capable of only limited self-care, confined to bed or chair 50% or more of waking hours)  4 - Bedbound (Completely disabled. Cannot carry on any self-care. Totally confined to bed or chair)  5 - Death   Eustace Pen MM, Creech RH, Tormey DC, et al. [phone]). "Toxicity and response criteria of the [facility] [employer] [facility] [employer]". [place] Oncol. 5 (6):  649-55   LABORATORY DATA:  Lab Results  Component Value Date   WBC 10.9 (H) 05/22/2018   HGB 15.0 05/22/2018   HCT 44.8 05/22/2018   MCV 95.7 05/22/2018   PLT 261 05/22/2018   CMP     Component Value Date/Time   NA 142 05/22/2018 1158   K 4.2 05/22/2018 1158   CL 111 05/22/2018 1158   CO2 23 05/22/2018 1158   GLUCOSE 95 05/22/2018 1158   BUN 8 05/22/2018 1158   CREATININE 1.05 (H) 05/22/2018 1158   CREATININE 1.08 08/22/2013 1039   CALCIUM 9.3 05/22/2018 1158   PROT 7.0 05/22/2018 1158   ALBUMIN 3.8 05/22/2018 1158   AST 19 05/22/2018 1158   ALT 25 05/22/2018 1158   ALKPHOS 52 05/22/2018 1158   BILITOT 0.6 05/22/2018 1158   GFRNONAA >60 05/22/2018 1158   GFRAA >60 05/22/2018 1158         RADIOGRAPHY: Mm Clip Placement Left  Result Date: 05/07/2018 CLINICAL DATA:  Post biopsy mammogram of the left breast for clip placement. EXAM: DIAGNOSTIC LEFT MAMMOGRAM POST ULTRASOUND BIOPSY COMPARISON:  Previous exam(s). FINDINGS: Mammographic images were obtained following ultrasound guided biopsy of 2 masses in the left breast. The ribbon shaped biopsy marking clip is well positioned at the site of the 930, 2 cm from the nipple mass. The wing shaped biopsy marking clip is positioned just posterior to the second spiculated mass at 9:30, 3 cm from the nipple. IMPRESSION: 1. Appropriate positioning of the ribbon shaped biopsy marking clip at the site of the mass at 930, 2 cm from the nipple. 2. Appropriate positioning of the wing shaped biopsy marking clip at the site of the mass at 9:30, 3 cm from the nipple. Final Assessment: Post Procedure Mammograms for Marker Placement Electronically Signed   By: Ammie Ferrier M.D.   On: 05/07/2018 09:32   [place] Lt Breast Bx W Loc Dev 1st Lesion Img Bx Spec US Guide  Addendum Date: 05/13/2018   ADDENDUM REPORT: 05/10/2018 17:57 ADDENDUM: Pathology of the left breast biopsy 9:30 o'clock 3 cm from nipple revealed INVASIVE MAMMARY CARCINOMA, GRADE II.  MAMMARY CARCINOMA IN SITU. Pathology of the left breast biopsy, 9:30 o'clock, 2 cm from the nipple revealed INVASIVE DUCTAL CARCINOMA, GRADE I. MAMMARY CARCINOMA IN SITU. Immunohistochemical stain for E-cadherin for characterization of invasive carcinoma in part 1 and in situ carcinoma in part 1 and 2 revealed 1. Immunostain for E-cadherin shows relatively faint, cytoplasmic and somewhat membranous staining, suggestive of a ductal phenotype. 2. Immunostain for E-cadherin shows strong membranous staining, consistent with a ductal phenotype. This was found to be concordant by Dr. Theda Sers. Recommendation: Surgical and oncology referrals. Results and recommendations were relayed to Derrek Monaco, NP by phone by Jetta Lout, [place] on 05/10/18. At the patient's request, results and recommendations were  relayed to the patient by phone by Dr. Theda Sers on 05/10/18. The patient stated she has a rash and blisters at the Steri-Strip bandage. Dr. Theda Sers instructed the patient to remove the Steri-Strips and to cover with sterile gauze. She was asked to contact Dr. Theda Sers at the [facility] [place] with any further questions or concerns. The patient requested that her referral be made in [place]. A surgical referral was made with Dr. Dalbert Batman at [facility] for 05/16/18 at 11:45 AM by Jetta Lout, [place]. The patient has been notified of the appointment information. Addendum by Jetta Lout, RRA on 05/10/18. Electronically Signed   By: Ammie Ferrier M.D.   On: 05/10/2018 17:57   Result Date: 05/13/2018 CLINICAL DATA:  37 year old female presenting for ultrasound-guided biopsy of 2 masses in the left breast. EXAM: ULTRASOUND GUIDED LEFT BREAST CORE NEEDLE BIOPSY COMPARISON:  Previous exam(s). FINDINGS: I met with the patient and we discussed the procedure of ultrasound-guided biopsy, including benefits and alternatives. We discussed the high likelihood of a successful procedure. We discussed  the risks of the procedure, including infection, bleeding, tissue injury, clip migration, and inadequate sampling. Informed written consent was given. The usual time-out protocol was performed immediately prior to the procedure. Lesion quadrant: Upper-inner quadrant Using sterile technique and 1% Lidocaine as local anesthetic, under direct ultrasound visualization, a 14 gauge spring-loaded device was used to perform biopsy of a mass in the left breast at 9:30, 2 cm from the nipple using a inferomedial approach. At the conclusion of the procedure a ribbon shaped tissue marker clip was deployed into the biopsy cavity. ------------------------------------------------------------------------------------------------------------------------------------------------------------------------------------ Per the original diagnostic report, there was concern regarding an additional possible mass in the left breast. This mass was seen at 9:30, 3 cm from the nipple measuring 1.0 x 0.8 x 0.8 cm. Lesion quadrant: Upper-inner quadrant Using sterile technique and 1% Lidocaine as local anesthetic, under direct ultrasound visualization, a 14 gauge spring-loaded device was used to perform biopsy of a mass in the left breast at 9:30, 3 cm from the nipple using a inferior approach. At the conclusion of the procedure a wing shaped tissue marker clip was deployed into the biopsy cavity. Follow up 2 view mammogram was performed and dictated separately. IMPRESSION: 1. Ultrasound guided biopsy of a mass in the left breast at 9:30, 2 cm from the nipple. No apparent complications. 2. Ultrasound-guided biopsy of a mass in the left breast at 9:30, 3 cm from the nipple. No apparent complications. Electronically Signed: By: Ammie Ferrier M.D. On: 05/07/2018 09:33   [place] Lt Breast Bx W Loc Dev Ea Add Lesion Img Bx Spec US Guide  Addendum Date: 05/13/2018   ADDENDUM REPORT: 05/10/2018 17:57 ADDENDUM: Pathology of the left breast biopsy 9:30  o'clock 3 cm from nipple revealed INVASIVE MAMMARY CARCINOMA, GRADE II. MAMMARY CARCINOMA IN SITU. Pathology of the left breast biopsy, 9:30 o'clock, 2 cm from the nipple revealed INVASIVE DUCTAL CARCINOMA, GRADE I. MAMMARY CARCINOMA IN SITU. Immunohistochemical stain for E-cadherin for characterization of invasive carcinoma in part 1 and in situ carcinoma in part 1 and 2 revealed 1. Immunostain for E-cadherin shows relatively faint, cytoplasmic and somewhat membranous staining, suggestive of a ductal phenotype. 2. Immunostain for E-cadherin shows strong membranous staining, consistent with a ductal phenotype. This was found to be concordant by Dr. Theda Sers. Recommendation: Surgical and oncology referrals. Results and recommendations were relayed to Derrek Monaco, NP by phone by Jetta Lout, [place] on 05/10/18. At the patient's request, results  and recommendations were relayed to the patient by phone by Dr. Theda Sers on 05/10/18. The patient stated she has a rash and blisters at the Steri-Strip bandage. Dr. Theda Sers instructed the patient to remove the Steri-Strips and to cover with sterile gauze. She was asked to contact Dr. Theda Sers at the [facility] [place] with any further questions or concerns. The patient requested that her referral be made in [place]. A surgical referral was made with Dr. Dalbert Batman at [facility] for 05/16/18 at 11:45 AM by Jetta Lout, [place]. The patient has been notified of the appointment information. Addendum by Jetta Lout, RRA on 05/10/18. Electronically Signed   By: Ammie Ferrier M.D.   On: 05/10/2018 17:57   Result Date: 05/13/2018 CLINICAL DATA:  37 year old female presenting for ultrasound-guided biopsy of 2 masses in the left breast. EXAM: ULTRASOUND GUIDED LEFT BREAST CORE NEEDLE BIOPSY COMPARISON:  Previous exam(s). FINDINGS: I met with the patient and we discussed the procedure of ultrasound-guided biopsy, including benefits and alternatives. We  discussed the high likelihood of a successful procedure. We discussed the risks of the procedure, including infection, bleeding, tissue injury, clip migration, and inadequate sampling. Informed written consent was given. The usual time-out protocol was performed immediately prior to the procedure. Lesion quadrant: Upper-inner quadrant Using sterile technique and 1% Lidocaine as local anesthetic, under direct ultrasound visualization, a 14 gauge spring-loaded device was used to perform biopsy of a mass in the left breast at 9:30, 2 cm from the nipple using a inferomedial approach. At the conclusion of the procedure a ribbon shaped tissue marker clip was deployed into the biopsy cavity. ------------------------------------------------------------------------------------------------------------------------------------------------------------------------------------ Per the original diagnostic report, there was concern regarding an additional possible mass in the left breast. This mass was seen at 9:30, 3 cm from the nipple measuring 1.0 x 0.8 x 0.8 cm. Lesion quadrant: Upper-inner quadrant Using sterile technique and 1% Lidocaine as local anesthetic, under direct ultrasound visualization, a 14 gauge spring-loaded device was used to perform biopsy of a mass in the left breast at 9:30, 3 cm from the nipple using a inferior approach. At the conclusion of the procedure a wing shaped tissue marker clip was deployed into the biopsy cavity. Follow up 2 view mammogram was performed and dictated separately. IMPRESSION: 1. Ultrasound guided biopsy of a mass in the left breast at 9:30, 2 cm from the nipple. No apparent complications. 2. Ultrasound-guided biopsy of a mass in the left breast at 9:30, 3 cm from the nipple. No apparent complications. Electronically Signed: By: Ammie Ferrier M.D. On: 05/07/2018 09:33      IMPRESSION/PLAN: Breast cancer, left breast, ER positive  It was a pleasure meeting the patient today. We  discussed the risks, benefits, and side effects of radiotherapy.  She understands that in the setting of breast conservation I would recommend radiotherapy to the breast to reduce her risk of locoregional recurrence by 2/3.  We discussed that radiation would take approximately 6 weeks to complete and that I would give the patient a few weeks to heal following surgery before starting treatment planning.  Radiotherapy is not impossible but is less likely to be considered in the setting of mastectomy.  We discussed indications for postmastectomy radiation  If chemotherapy were to be given, this would precede radiotherapy. We spoke about acute effects including skin irritation and fatigue as well as much less common late effects including internal organ injury or irritation. We discussed the cardiac sparing techniques offered here in [place] for left  sided breast cancers.  We spoke about the latest technology that is used to minimize the risk of late effects for patients undergoing radiotherapy to the breast or chest wall. No guarantees of treatment were given. The patient is enthusiastic about proceeding with treatment. I look forward to participating in the patient's care.  I will await her referral back to me for postoperative follow-up and eventual CT simulation/treatment planning.   __________________________________________   Eppie Gibson, MD

## 2018-05-22 NOTE — Progress Notes (Signed)
REFERRING PROVIDER: Fanny Skates, MD [address] [place], [place] [address]  PRIMARY PROVIDER:  Sharilyn Sites, MD  PRIMARY REASON FOR VISIT:  1. Malignant neoplasm of overlapping sites of left breast in female, estrogen receptor positive ([place])   2. Family history of kidney cancer   3. Family history of bladder cancer   4. Family history of stomach cancer   5. Family history of lung cancer    HISTORY OF PRESENT ILLNESS:   Breanna [name], a 37 y.o. female, was seen for a [facility] cancer genetics consultation at the request of Dr. Dalbert Batman due to a personal history of breast cancer dx at 10.  Breanna [name] presents to clinic today to discuss the possibility of a hereditary predisposition to cancer, genetic testing, and to further clarify her future cancer risks, as well as potential cancer risks for family members.   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  She is currently considering treatment options at this time. She states that a positive genetic test result would probably change her surgical decision.   HORMONAL RISK FACTORS:  Menarche was at age 83.  First live birth at age 26-25.  OCP use for approximately 2 years (pills) has had implant for 12 years.  Ovaries intact: yes.  Hysterectomy: no.  Menopausal status: premenopausal.  HRT use: 0 years. Colonoscopy: yes; 2017, no polyps. (colon) some gastric polyps identified.  Past Medical History:  Diagnosis Date  . Allergy   . Anxiety   . Breast nodule 05/21/2014  . Family history of bladder cancer   . Family history of kidney cancer   . Family history of lung cancer   . Family history of stomach cancer   . GERD (gastroesophageal reflux disease)   . Obesity   . Pain of both breasts 05/21/2014  . Pseudotumor cerebri syndrome    takes diamox    Past Surgical History:  Procedure  Laterality Date  . BIOPSY  11/08/2015   Procedure: BIOPSY;  Surgeon: Danie Binder, MD;  [facility];  Service: Endoscopy;;  Gastric bx and Gastric polyp bx  . CESAREAN SECTION     x 2  . CHOLECYSTECTOMY     age 73  . COLONOSCOPY N/A 11/08/2015   Procedure: COLONOSCOPY;  Surgeon: Danie Binder, MD;  [facility]: [facility];  Service: Endoscopy;  Laterality: N/A;  1000  . ESOPHAGOGASTRODUODENOSCOPY N/A 11/08/2015   Procedure: ESOPHAGOGASTRODUODENOSCOPY (EGD);  Surgeon: Danie Binder, MD;  [facility];  Service: Endoscopy;  Laterality: N/A;  . HEMORRHOID BANDING N/A 11/08/2015   Procedure: HEMORRHOID BANDING;  Surgeon: Danie Binder, MD;  [facility];  Service: Endoscopy;  Laterality: N/A;  . SAVORY DILATION N/A 11/08/2015   Procedure: SAVORY DILATION;  Surgeon: Danie Binder, MD;  [facility];  Service: Endoscopy;  Laterality: N/A;  . WISDOM TOOTH EXTRACTION      Social History   Socioeconomic History  . Marital status: Married    Spouse name: Donnie  . Number of children: 2  . Years of education: 52  . Highest education level: Not on file  Occupational History    Comment: LPN, [place]  . Financial resource strain: Not on file  . Food insecurity:    Worry: Not on file    Inability: Not  on file  . Transportation needs:    Medical: No    Non-medical: No  Tobacco Use  . Smoking status: Never Smoker  . Smokeless tobacco: Never Used  Substance and Sexual Activity  . Alcohol use: No  . Drug use: No  . Sexual activity: Yes    Birth control/protection: Implant  Lifestyle  . Physical activity:    Days per week: Not on file    Minutes per session: Not on file  . Stress: Not on file  Relationships  . Social connections:    Talks on phone: Not on file    Gets together: Not on file    Attends religious service: Not on file    Active member of club or organization: Not on file    Attends meetings of  clubs or organizations: Not on file    Relationship status: Not on file  Other Topics Concern  . Not on file  Social History Narrative   Lives at home with family, 1 son, 1 daughter   Caffeine use - tea 2 - 32 oz cups daily     FAMILY HISTORY:  We obtained a detailed, 4-generation family history.  Significant diagnoses are listed below: Family History  Problem Relation Age of Onset  . COPD Mother   . Pneumonia Mother   . Hyperlipidemia Father   . Kidney cancer Father 16  . Heart disease Maternal Aunt   . Hypertension Maternal Grandmother   . Diabetes Maternal Grandmother   . Stroke Paternal Grandmother   . Hypertension Paternal Grandmother   . Diabetes Paternal Grandmother   . Stomach cancer Paternal Grandmother 24  . Diabetes Paternal Grandfather   . Heart disease Paternal Grandfather   . Hyperlipidemia Paternal Grandfather   . Hypertension Paternal Grandfather   . Bladder Cancer Maternal Uncle        hx smoking  . Lung cancer Other   . Colon cancer Neg Hx     Breanna [name] has a 23 year-old son and a 69 year-old daughter with no hx of cancer. She had a miscarriage previously as well. She is an only child.   Breanna [name]'s father: 99, hx of kidney cancer dx at 66.  Was dx early, only surgery needed. Paternal aunts/Uncles: 4 paternal uncles, no hx of cancer. Paternal cousins: no hx of cancer known Paternal grandfather: died at 71, no hx of cancer.  His sister had lung cancer (patient's great aunt) Paternal grandmother:dx with stomach cancer at 40, now 99  Breanna [name]'s mother: died at 55 due to COPD, pneumonia.  She had a hysterectomy at 50. She had breast calcifications.  Maternal Aunts/Uncles: 2 maternal aunts, 2 maternal uncles. 1 uncle had bladder cancer, he has a hx of smoking Maternal cousins: no known hx of cancer Maternal grandfather: died in 29's, unk cause Maternal grandmother:84, no hx of cancer.  Her mother lived to 72 (patient's great grandmother)  Breanna [name] is  unaware of previous family history of genetic testing for hereditary cancer risks. Patient's maternal ancestors are of N. European/[place] descent, and paternal ancestors are of N. European descent. There is no reported Ashkenazi Jewish ancestry. There is no known consanguinity.  GENETIC COUNSELING ASSESSMENT: Breanna [name] is a 37 y.o. female with a personal and family history which is somewhat suggestive of a Hereditary Cancer Predisposition Syndrome. We, therefore, discussed and recommended the following at today's visit.   DISCUSSION: We reviewed the characteristics, features and inheritance patterns of hereditary cancer syndromes. We also  discussed genetic testing, including the appropriate family members to test, the process of testing, insurance coverage and turn-around-time for results. We discussed the implications of a negative, positive and/or variant of uncertain significant result. In order to get genetic test results in a timely manner so that Breanna [name] can use these genetic test results for surgical decisions, we recommended Breanna [name] pursue genetic testing for the Breast Cancer STAT panel.   The STAT Breast cancer panel offered by [facility] includes sequencing and rearrangement analysis for the following 9 genes:  ATM, BRCA1, BRCA2, CDH1, CHEK2, PALB2, PTEN, STK11 and TP53. We then recommend Breanna [name] pursue reflex genetic testing to the Multi-Cancer gene panel.   The Multi-Cancer Panel offered by [facility] includes sequencing and/or deletion duplication testing of the following 91 genes: AIP, ALK, APC, ATM, AXIN2, BAP1, BARD1, BLM, BMPR1A, BRCA1, BRCA2, BRIP1, BUB1B, CASR, CDC73, CDH1, CDK4, CDKN1B, CDKN1C, CDKN2A, CEBPA, CEP57, CHEK2, CTNNA1, DICER1, DIS3L2, EGFR, ENG, EPCAM, FH, FLCN, GALNT12, GATA2, GPC3, GREM1, HOXB13, HRAS, KIT, MAX, MEN1, MET, MITF, MLH1, MLH3, MSH2, MSH3, MSH6, MUTYH, NBN, NF1, NF2, NTHL1, PALB2, PDGFRA, PHOX2B, PMS2, POLD1, POLE, POT1, PRKAR1A, PTCH1, PTEN, RAD50,  RAD51C, RAD51D, RB1, RECQL4, RET, RNF43, RPS20, RUNX1, SDHA, SDHAF2, SDHB, SDHC, SDHD, SMAD4, SMARCA4, SMARCB1, SMARCE1, STK11, SUFU, TERC, TERT, TMEM127, TP53, TSC1, TSC2, VHL, WRN, WT1  We discussed that only 5-10% of cancers are associated with a Hereditary cancer predisposition syndrome.  One of the most common hereditary cancer syndromes that increases breast cancer risk is called Hereditary Breast and Ovarian Cancer (HBOC) syndrome.  This syndrome is caused by mutations in the BRCA1 and BRCA2 genes.  This syndrome increases an individual's lifetime risk to develop breast, ovarian, pancreatic, and other types of cancer.  There are also many other cancer predisposition syndromes caused by mutations in several other genes.  We discussed that if she is found to have a mutation in one of these genes, it may impact surgical decisions, and alter future medical management recommendations such as increased cancer screenings and consideration of risk reducing surgeries.  A positive result could also have implications for the patient's family members.  A Negative result would mean we were unable to identify a hereditary component to her cancer, but does not rule out the possibility of a hereditary basis for her cancer.  There could be mutations that are undetectable by current technology, or in genes not yet tested or identified to increase cancer risk.    We discussed the potential to find a Variant of Uncertain Significance or VUS.  These are variants that have not yet been identified as pathogenic or benign, and it is unknown if this variant is associated with increased cancer risk or if this is a normal finding.  Most VUS's are reclassified to benign or likely benign.   It should not be used to make medical management decisions. With time, we suspect the lab will determine the significance of any VUS's identified if any.   Based on Breanna [name]'s personal and family history of cancer, she meets medical  criteria for genetic testing. Despite that she meets criteria, she may still have an out of pocket cost. The laboratory can provide her with an estimate of her OOP cost. she was given the contact information of the laboratory if she has further questions.   PLAN: After considering the risks, benefits, and limitations, Breanna [name]  provided informed consent to pursue genetic testing and the blood sample was sent to [facility] for analysis of the  Breast Cancer STAT panel with plans to reflex to the Multi-cancer panel. Preliminary results should be available within approximately 5-12 days' time, at which point they will be disclosed by telephone to Breanna [name], as will any additional recommendations warranted by these results. Breanna [name] will receive a summary of her genetic counseling visit and a copy of her results once available. This information will also be available in Epic. We encouraged Breanna [name] to remain in contact with cancer genetics annually so that we can continuously update the family history and inform her of any changes in cancer genetics and testing that may be of benefit for her family. Ms. Ederer's questions were answered to her satisfaction today. Our contact information was provided should additional questions or concerns arise.  Based on Ms. Courser's family history, we recommended her father who had kidney cancer <50 also have genetic counseling and testing. Ms. Caloca will let us know if we can be of any assistance in coordinating genetic counseling and/or testing for this family member.   Lastly, we encouraged Ms. Orlich to remain in contact with cancer genetics annually so that we can continuously update the family history and inform her of any changes in cancer genetics and testing that may be of benefit for this family.   Ms.  Much's questions were answered to her satisfaction today. Our contact information was provided should additional questions or concerns arise. Thank  you for the referral and allowing [place] to share in the care of your patient.   Tana Felts, MS, [facility] Certified Genetic Counselor [email or web address] phone: [phone]  The patient was seen for a total of 40 minutes in face-to-face genetic counseling.  The patient was accompanied today by her husband. This patient was discussed with Drs. Magrinat, Lindi Adie and/or Burr Medico who agrees with the above.

## 2018-05-22 NOTE — Progress Notes (Signed)
[facility]  [phone] [phone]     ID: Breanna [name] DOB: 1981/04/27  MR#: [id number]  QPR#:[id number]  Patient Care Team: Sharilyn Sites, MD as PCP - General (Family Medicine) Danie Binder, MD as Consulting Physician (Gastroenterology) Magrinat, Virgie Dad, MD as Consulting Physician (Oncology) Eppie Gibson, MD as Attending Physician (Radiation Oncology) Fanny Skates, MD as Consulting Physician (General Surgery) Estill Dooms, NP as Nurse Practitioner (Obstetrics and Gynecology) OTHER MD:  CHIEF COMPLAINT: Estrogen receptor positive multifocal breast cancer  CURRENT TREATMENT: Awaiting definitive surgery   HISTORY OF CURRENT ILLNESS: Breanna Charbonneau Sher noted a palpable abnormality in the 4 o'clock location of the LEFT breast sometime in September 2019.  She brought it up to medical attention and underwent bilateral diagnostic mammography with tomography and left breast ultrasonography at [facility] on 04/23/2018 showing: a Suspicious mass in the 9:30 o'clock location of the LEFT breast 2 centimeters from the nipple; by ultrasound this was an irregular hypoechoic mass with spiculated margins and posterior acoustic shadowing which measures 0.5 x 0.7 x 1.1 centimeters. A second similar lesion is also suspected in the same portion of the breast but has not been completely evaluated. Recommend targeted ultrasound of the MEDIAL aspect of the LEFT breast at the time of patient's biopsy and possible second biopsy. LEFT axilla is negative for adenopathy. The areas of clinical concern in the LOWER OUTER QUADRANT of the LEFT breast in the [place] 10 centimeters from the nipple are negative by mammogram and ultrasound.  Accordingly on 05/07/2018 the patient proceeded to biopsy of the left breast areas in question. The pathology from this procedure showed ([id number]): 1. Breast, left, needle core biopsy, at 9:30 o'clock 3 cm from the  nipple: invasive mammary carcinoma, grade II. Mammary carcinoma in situ. 2. Breast, left, needle core biopsy, 9:30 o'clock 2 cm from the nipple with invasive ductal carcinoma, grade I. Mammary carcinoma in situ.  E-cadherin stains are faint in the larger tumor but still positive, strong in the second tumor, and thus these are read as ductal.  Prognostic indicators significant for:  1. Estrogen receptor, 80% positive, moderate staining intensity and progesterone receptor, 80% positive, strong staining intensity. Proliferation marker Ki67 at 10%. HER2 negative by immunohistochemistry, 1+.   2. Estrogen Receptor: 90%, positive, strong staining intensity and progesterone Receptor: 90%, positive, strong staining intensity. Proliferation Marker Ki67: 10%. HER2 negative by immunohistochemistry, 1+.    The patient's subsequent history is as detailed below.  INTERVAL HISTORY: Ande was evaluated in the breast cancer clinic on 05/23/2018 accompanied by her husband, Letitia Libra, and a friend . Her case was also presented at the multidisciplinary breast cancer conference on the same day. At that time a preliminary plan was proposed: breast MRI, and depending on results, breast conserving surgery vs mastectomy, Oncotype or Mammaprint, antiestrogens, with consideration of goserelin, adjuvant radiation, and genetics testing.    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 She has a hx of palpitations that are occasional, however, she hasn't been evaluated for this. She denies feeling short of breath when she has these symptoms. She doesn't drink a lot of caffeine.  She denies having a neurologist, or a dermatologist. Dr. Sherlynn Stalls is her retinal specialist and she was discharged. She had a colonoscopy and an upper endoscopy completed by Dr. Barney Drain in 2017.  Aside from the palpable change in her breast, there were  no specific symptoms leading to the original  mammogram, which was routinely scheduled. The patient denies unusual headaches, visual changes, nausea, vomiting, stiff neck, dizziness, or gait imbalance. There has been no cough, phlegm production, or pleurisy, no chest pain or pressure, and no change in bowel or bladder habits. The patient denies fever, rash, bleeding, unexplained fatigue or unexplained weight loss. A detailed review of systems was otherwise entirely negative.   PAST MEDICAL HISTORY: Past Medical History:  Diagnosis Date  . Allergy   . Anxiety   . Breast nodule 05/21/2014  . Family history of bladder cancer   . Family history of kidney cancer   . Family history of lung cancer   . Family history of stomach cancer   . GERD (gastroesophageal reflux disease)   . Obesity   . Pain of both breasts 05/21/2014  . Pseudotumor cerebri syndrome    takes diamox    PAST SURGICAL HISTORY: Past Surgical History:  Procedure Laterality Date  . BIOPSY  11/08/2015   Procedure: BIOPSY;  Surgeon: Danie Binder, MD;  [facility];  Service: Endoscopy;;  Gastric bx and Gastric polyp bx  . CESAREAN SECTION     x 2  . CHOLECYSTECTOMY     age 65  . COLONOSCOPY N/A 11/08/2015   Procedure: COLONOSCOPY;  Surgeon: Danie Binder, MD;  [facility];  Service: Endoscopy;  Laterality: N/A;  1000  . ESOPHAGOGASTRODUODENOSCOPY N/A 11/08/2015   Procedure: ESOPHAGOGASTRODUODENOSCOPY (EGD);  Surgeon: Danie Binder, MD;  [facility];  Service: Endoscopy;  Laterality: N/A;  . HEMORRHOID BANDING N/A 11/08/2015   Procedure: HEMORRHOID BANDING;  Surgeon: Danie Binder, MD;  [facility];  Service: Endoscopy;  Laterality: N/A;  . SAVORY DILATION N/A 11/08/2015   Procedure: SAVORY DILATION;  Surgeon: Danie Binder, MD;  [facility];  Service: Endoscopy;  Laterality: N/A;  . WISDOM TOOTH EXTRACTION      FAMILY HISTORY Family History  Problem Relation Age of Onset  . COPD Mother   . Pneumonia  Mother   . Hyperlipidemia Father   . Kidney cancer Father 76  . Heart disease Maternal Aunt   . Hypertension Maternal Grandmother   . Diabetes Maternal Grandmother   . Stroke Paternal Grandmother   . Hypertension Paternal Grandmother   . Diabetes Paternal Grandmother   . Stomach cancer Paternal Grandmother 89  . Diabetes Paternal Grandfather   . Heart disease Paternal Grandfather   . Hyperlipidemia Paternal Grandfather   . Hypertension Paternal Grandfather   . Bladder Cancer Maternal Uncle        hx smoking  . Lung cancer Other   . Colon cancer Neg Hx    As of November 2019 her father is 17 years old. Patients' mother died from pneumonia at age 80. The patient has 0 brothers and 0 sisters. Patient denies any family story of ovarian or breast cancer. Her paternal grandmother had gastric cancer in 2000. Her paternal aunt had lung cancer without a hx of smoking. Her maternal uncle has had bladder cancer.   GYNECOLOGIC HISTORY:  No LMP recorded. Patient has had an implant. Menarche: 37 years old Age at first live birth: 37 years old [place] P2 LMP: No Contraceptive: Implanon in place.  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 Her husband Letitia Libra works for [employer] of  [place] as an Mining engineer. Her two children are 47.22 years old and 53 years old.  The patient attends full [employer] in [place].     ADVANCED DIRECTIVES:  n/a   HEALTH MAINTENANCE: Social History   Tobacco Use  . Smoking status: Never Smoker  . Smokeless tobacco: Never Used  Substance Use Topics  . Alcohol use: No  . Drug use: No     Colonoscopy: 2017  PAP: 2017  Bone density: No   Allergies  Allergen Reactions  . Topamax [Topiramate] Other (See Comments)    Made her feel like a zombie; "drunk"  . Codeine Itching and Rash  . Latex Itching and Rash  . Penicillins Itching and Rash    Has patient had a PCN  reaction causing immediate rash, facial/tongue/throat swelling, SOB or lightheadedness with hypotension: Yes Has patient had a PCN reaction causing severe rash involving mucus membranes or skin necrosis: No Has patient had a PCN reaction that required hospitalization No Has patient had a PCN reaction occurring within the last 10 years: No If all of the above answers are "NO", then may proceed with Cephalosporin use.   . Sulfa Antibiotics Itching and Rash    Current Outpatient Medications  Medication Sig Dispense Refill  . acetaminophen (TYLENOL) 500 MG tablet Take 1,000 mg by mouth every 6 (six) hours as needed for mild pain.    Marland Kitchen acetaZOLAMIDE (DIAMOX) 500 MG capsule Take 1 capsule (500 mg total) by mouth 2 (two) times daily. 60 capsule 6  . ALPRAZolam (XANAX) 0.5 MG tablet Take 1 tablet by mouth daily as needed for anxiety.     . busPIRone (BUSPAR) 7.5 MG tablet     . cetirizine (ZYRTEC) 10 MG tablet Take 10 mg by mouth daily.    Marland Kitchen etonogestrel (NEXPLANON) 68 MG IMPL implant 1 each by Subdermal route once. Plans to be removed 05/29/18    . hydrocortisone (ANUSOL-HC) 2.5 % rectal cream Place 1 application rectally 2 (two) times daily as needed for hemorrhoids or itching.    Marland Kitchen ibuprofen (ADVIL,MOTRIN) 200 MG tablet Take 400 mg by mouth every 6 (six) hours as needed for moderate pain.    . mupirocin ointment (BACTROBAN) 2 % APPLY AA TID  1  . pantoprazole (PROTONIX) 40 MG tablet TAKE 1 TABLET BY MOUTH ONCE DAILY. 30 tablet 5  . VENTOLIN HFA 108 (90 Base) MCG/ACT inhaler INHALE 2 PUFFS INTO THE LUNGS Q 4 H PRN  0  . zonisamide (ZONEGRAN) 25 MG capsule      No current facility-administered medications for this visit.     OBJECTIVE: Young white woman in no acute distress Vitals with BMI 05/22/2018  Height '5\' 5"'$   Weight 259 lbs 6 oz  BMI 16.10  Systolic 960  Diastolic 73  Pulse   Respirations 18   There were no vitals filed for this visit.   There is no height or weight on file to  calculate BMI.   Wt Readings from Last 3 Encounters:  05/22/18 259 lb 6.4 oz (117.7 kg)  04/18/18 256 lb (116.1 kg)  06/29/16 253 lb (114.8 kg)      ECOG FS:0 - Asymptomatic  Ocular: Sclerae unicteric, pupils round and equal Ear-nose-throat: Oropharynx clear and moist Lymphatic: No cervical or supraclavicular adenopathy Lungs no rales or rhonchi Heart regular rate and rhythm Abd soft, nontender, positive bowel sounds MSK no focal spinal tenderness, no joint edema Neuro: non-focal, well-oriented, appropriate affect Breasts: The right breast is unremarkable.  The  left breast is status post biopsy x2.  There is no ecchymosis and no palpable mass.  Both axillae are benign.  LAB RESULTS:  CMP     Component Value Date/Time   NA 142 05/22/2018 1158   K 4.2 05/22/2018 1158   CL 111 05/22/2018 1158   CO2 23 05/22/2018 1158   GLUCOSE 95 05/22/2018 1158   BUN 8 05/22/2018 1158   CREATININE 1.05 (H) 05/22/2018 1158   CREATININE 1.08 08/22/2013 1039   CALCIUM 9.3 05/22/2018 1158   PROT 7.0 05/22/2018 1158   ALBUMIN 3.8 05/22/2018 1158   AST 19 05/22/2018 1158   ALT 25 05/22/2018 1158   ALKPHOS 52 05/22/2018 1158   BILITOT 0.6 05/22/2018 1158   GFRNONAA >60 05/22/2018 1158   GFRAA >60 05/22/2018 1158    No results found for: TOTALPROTELP, ALBUMINELP, A1GS, A2GS, BETS, BETA2SER, GAMS, MSPIKE, SPEI  No results found for: KPAFRELGTCHN, LAMBDASER, KAPLAMBRATIO  Lab Results  Component Value Date   WBC 10.9 (H) 05/22/2018   NEUTROABS 8.0 (H) 05/22/2018   HGB 15.0 05/22/2018   HCT 44.8 05/22/2018   MCV 95.7 05/22/2018   PLT 261 05/22/2018    '@LASTCHEMISTRY'$ @  No results found for: LABCA2  No components found for: LOVFIE332  No results for input(s): INR in the last 168 hours.  No results found for: LABCA2  No results found for: RJJ884  No results found for: ZYS063  No results found for: KZS010  No results found for: CA2729  No components found for: HGQUANT  No  results found for: CEA1 / No results found for: CEA1   No results found for: AFPTUMOR  No results found for: CHROMOGRNA  No results found for: PSA1  Appointment on 05/22/2018  Component Date Value Ref Range Status  . WBC Count 05/22/2018 10.9* 4.0 - 10.5 K/uL Final  . RBC 05/22/2018 4.68  3.87 - 5.11 MIL/uL Final  . Hemoglobin 05/22/2018 15.0  12.0 - 15.0 g/dL Final  . HCT 05/22/2018 44.8  36.0 - 46.0 % Final  . MCV 05/22/2018 95.7  80.0 - 100.0 fL Final  . MCH 05/22/2018 32.1  26.0 - 34.0 pg Final  . MCHC 05/22/2018 33.5  30.0 - 36.0 g/dL Final  . RDW 05/22/2018 12.5  11.5 - 15.5 % Final  . Platelet Count 05/22/2018 261  150 - 400 K/uL Final  . nRBC 05/22/2018 0.0  0.0 - 0.2 % Final  . Neutrophils Relative % 05/22/2018 73  % Final  . Neutro Abs 05/22/2018 8.0* 1.7 - 7.7 K/uL Final  . Lymphocytes Relative 05/22/2018 18  % Final  . Lymphs Abs 05/22/2018 1.9  0.7 - 4.0 K/uL Final  . Monocytes Relative 05/22/2018 6  % Final  . Monocytes Absolute 05/22/2018 0.7  0.1 - 1.0 K/uL Final  . Eosinophils Relative 05/22/2018 2  % Final  . Eosinophils Absolute 05/22/2018 0.2  0.0 - 0.5 K/uL Final  . Basophils Relative 05/22/2018 1  % Final  . Basophils Absolute 05/22/2018 0.1  0.0 - 0.1 K/uL Final  . Immature Granulocytes 05/22/2018 0  % Final  . Abs Immature Granulocytes 05/22/2018 0.04  0.00 - 0.07 K/uL Final   Performed at [facility], [place] [address] [place], [place] [address]  . Sodium 05/22/2018 142  135 - 145 mmol/L Final  . Potassium 05/22/2018 4.2  3.5 - 5.1 mmol/L Final  . Chloride 05/22/2018 111  98 - 111 mmol/L Final  . CO2 05/22/2018 23  22 -  32 mmol/L Final  . Glucose, Bld 05/22/2018 95  70 - 99 mg/dL Final  . BUN 05/22/2018 8  6 - 20 mg/dL Final  . Creatinine 05/22/2018 1.05* 0.44 - 1.00 mg/dL Final  . Calcium 05/22/2018 9.3  8.9 - 10.3 mg/dL Final  . Total Protein 05/22/2018 7.0  6.5 - 8.1 g/dL Final  . Albumin 05/22/2018 3.8  3.5 - 5.0 g/dL  Final  . AST 05/22/2018 19  15 - 41 U/L Final  . ALT 05/22/2018 25  0 - 44 U/L Final  . Alkaline Phosphatase 05/22/2018 52  38 - 126 U/L Final  . Total Bilirubin 05/22/2018 0.6  0.3 - 1.2 mg/dL Final  . GFR, Est Non Af Am 05/22/2018 >60  >60 mL/min Final  . GFR, Est AFR Am 05/22/2018 >60  >60 mL/min Final   Comment: (NOTE) The eGFR has been calculated using the CKD EPI equation. This calculation has not been validated in all clinical situations. eGFR's persistently <60 mL/min signify possible Chronic Kidney Disease.   Georgiann Hahn gap 05/22/2018 8  5 - 15 Final   Performed at [facility], [place] [address], [place], [place] [address]    (this displays the last labs from the last 3 days)  No results found for: TOTALPROTELP, ALBUMINELP, A1GS, A2GS, BETS, BETA2SER, GAMS, MSPIKE, SPEI (this displays SPEP labs)  No results found for: KPAFRELGTCHN, LAMBDASER, KAPLAMBRATIO (kappa/lambda light chains)  No results found for: HGBA, HGBA2QUANT, HGBFQUANT, HGBSQUAN (Hemoglobinopathy evaluation)   No results found for: LDH  No results found for: IRON, TIBC, IRONPCTSAT (Iron and TIBC)  No results found for: FERRITIN  Urinalysis No results found for: COLORURINE, APPEARANCEUR, LABSPEC, PHURINE, GLUCOSEU, HGBUR, BILIRUBINUR, KETONESUR, PROTEINUR, UROBILINOGEN, NITRITE, LEUKOCYTESUR   STUDIES:  US Breast Ltd Uni Left Inc Axilla  Result Date: 04/23/2018 CLINICAL DATA:  Patient has a palpable abnormality in the 4 o'clock location of the LEFT breast noted on recent physical exam. EXAM: DIGITAL DIAGNOSTIC BILATERAL MAMMOGRAM WITH CAD AND TOMO ULTRASOUND LEFT BREAST COMPARISON:  Previous exam(s). ACR Breast Density Category b: There are scattered areas of fibroglandular density. FINDINGS: At the time of patient's exam, technical issues limited evaluation of all images while the patient was in the department. The study was completed and reviewed only after the patient left the  department. Within the LOWER INNER QUADRANT of the LEFT breast there are 2 possible spiculated masses. Only 1 of these masses was identified while the patient was in the department. Spot compression view confirms presence of 1 of the spiculated masses but a second lesion is suspected. Spot tangential view of the LOWER OUTER QUADRANT of the LEFT breast in the area of recent physical exam abnormality is unremarkable. RIGHT breast is negative. Mammographic images were processed with CAD. On physical exam, I palpate nonfocal thickening in the LOWER OUTER QUADRANT of the LEFT breast. While evaluating the patient, she also reports a palpable abnormality in the UPPER INNER QUADRANT of the LEFT breast 10 centimeters from the nipple, not associated discrete mass on my exam. I palpate no mass in the MEDIAL periareolar region of the LEFT breast. Targeted ultrasound is performed, showing normal appearing fibroglandular tissue in the LOWER OUTER QUADRANT of the LEFT breast and within the [place] of the LEFT breast 10 centimeters from the nipple in the areas of patient's concern. In the 9:30 o'clock location of the LEFT breast 2 centimeters from the nipple, there is an irregular hypoechoic mass with spiculated margins and posterior  acoustic shadowing which measures 0.5 x 0.7 x 1.1 centimeters. No associated internal blood flow identified. I did not search for a second lesion at the time of patient's ultrasound exam, but a second lesion is suspected. Evaluation of the LEFT axilla is negative for adenopathy. IMPRESSION: 1. Suspicious mass in the 9:30 o'clock location of the LEFT breast 2 centimeters from the nipple. 2. A second similar lesion is also suspected in the same portion of the breast but has not been completely evaluated. Recommend targeted ultrasound of the MEDIAL aspect of the LEFT breast at the time of patient's biopsy and possible second biopsy. I called the patient after her exam and we discussed the  possibility of a second lesion and possible second biopsy. 3. LEFT axilla is negative for adenopathy. 4. The areas of clinical concern in the LOWER OUTER QUADRANT of the LEFT breast in the [place] 10 centimeters from the nipple are negative by mammogram and ultrasound. RECOMMENDATION: Ultrasound-guided core biopsy mass in the 9:30 o'clock location of the LEFT breast. Targeted ultrasound of the MEDIAL LEFT breast and possible biopsy of a second lesion. I have discussed the findings and recommendations with the patient. Results were also provided in writing at the conclusion of the visit. If applicable, a reminder letter will be sent to the patient regarding the next appointment. BI-RADS CATEGORY  4: Suspicious. Electronically Signed   By: Nolon Nations M.D.   On: 04/23/2018 17:15   Mm Diag Breast Tomo Bilateral  Result Date: 04/23/2018 CLINICAL DATA:  Patient has a palpable abnormality in the 4 o'clock location of the LEFT breast noted on recent physical exam. EXAM: DIGITAL DIAGNOSTIC BILATERAL MAMMOGRAM WITH CAD AND TOMO ULTRASOUND LEFT BREAST COMPARISON:  Previous exam(s). ACR Breast Density Category b: There are scattered areas of fibroglandular density. FINDINGS: At the time of patient's exam, technical issues limited evaluation of all images while the patient was in the department. The study was completed and reviewed only after the patient left the department. Within the LOWER INNER QUADRANT of the LEFT breast there are 2 possible spiculated masses. Only 1 of these masses was identified while the patient was in the department. Spot compression view confirms presence of 1 of the spiculated masses but a second lesion is suspected. Spot tangential view of the LOWER OUTER QUADRANT of the LEFT breast in the area of recent physical exam abnormality is unremarkable. RIGHT breast is negative. Mammographic images were processed with CAD. On physical exam, I palpate nonfocal thickening in the LOWER  OUTER QUADRANT of the LEFT breast. While evaluating the patient, she also reports a palpable abnormality in the UPPER INNER QUADRANT of the LEFT breast 10 centimeters from the nipple, not associated discrete mass on my exam. I palpate no mass in the MEDIAL periareolar region of the LEFT breast. Targeted ultrasound is performed, showing normal appearing fibroglandular tissue in the LOWER OUTER QUADRANT of the LEFT breast and within the [place] of the LEFT breast 10 centimeters from the nipple in the areas of patient's concern. In the 9:30 o'clock location of the LEFT breast 2 centimeters from the nipple, there is an irregular hypoechoic mass with spiculated margins and posterior acoustic shadowing which measures 0.5 x 0.7 x 1.1 centimeters. No associated internal blood flow identified. I did not search for a second lesion at the time of patient's ultrasound exam, but a second lesion is suspected. Evaluation of the LEFT axilla is negative for adenopathy. IMPRESSION: 1. Suspicious mass in the 9:30  o'clock location of the LEFT breast 2 centimeters from the nipple. 2. A second similar lesion is also suspected in the same portion of the breast but has not been completely evaluated. Recommend targeted ultrasound of the MEDIAL aspect of the LEFT breast at the time of patient's biopsy and possible second biopsy. I called the patient after her exam and we discussed the possibility of a second lesion and possible second biopsy. 3. LEFT axilla is negative for adenopathy. 4. The areas of clinical concern in the LOWER OUTER QUADRANT of the LEFT breast in the [place] 10 centimeters from the nipple are negative by mammogram and ultrasound. RECOMMENDATION: Ultrasound-guided core biopsy mass in the 9:30 o'clock location of the LEFT breast. Targeted ultrasound of the MEDIAL LEFT breast and possible biopsy of a second lesion. I have discussed the findings and recommendations with the patient. Results were also  provided in writing at the conclusion of the visit. If applicable, a reminder letter will be sent to the patient regarding the next appointment. BI-RADS CATEGORY  4: Suspicious. Electronically Signed   By: Nolon Nations M.D.   On: 04/23/2018 17:15   Mm Clip Placement Left  Result Date: 05/07/2018 CLINICAL DATA:  Post biopsy mammogram of the left breast for clip placement. EXAM: DIAGNOSTIC LEFT MAMMOGRAM POST ULTRASOUND BIOPSY COMPARISON:  Previous exam(s). FINDINGS: Mammographic images were obtained following ultrasound guided biopsy of 2 masses in the left breast. The ribbon shaped biopsy marking clip is well positioned at the site of the 930, 2 cm from the nipple mass. The wing shaped biopsy marking clip is positioned just posterior to the second spiculated mass at 9:30, 3 cm from the nipple. IMPRESSION: 1. Appropriate positioning of the ribbon shaped biopsy marking clip at the site of the mass at 930, 2 cm from the nipple. 2. Appropriate positioning of the wing shaped biopsy marking clip at the site of the mass at 9:30, 3 cm from the nipple. Final Assessment: Post Procedure Mammograms for Marker Placement Electronically Signed   By: Ammie Ferrier M.D.   On: 05/07/2018 09:32   [place] Lt Breast Bx W Loc Dev 1st Lesion Img Bx Spec US Guide  Addendum Date: 05/13/2018   ADDENDUM REPORT: 05/10/2018 17:57 ADDENDUM: Pathology of the left breast biopsy 9:30 o'clock 3 cm from nipple revealed INVASIVE MAMMARY CARCINOMA, GRADE II. MAMMARY CARCINOMA IN SITU. Pathology of the left breast biopsy, 9:30 o'clock, 2 cm from the nipple revealed INVASIVE DUCTAL CARCINOMA, GRADE I. MAMMARY CARCINOMA IN SITU. Immunohistochemical stain for E-cadherin for characterization of invasive carcinoma in part 1 and in situ carcinoma in part 1 and 2 revealed 1. Immunostain for E-cadherin shows relatively faint, cytoplasmic and somewhat membranous staining, suggestive of a ductal phenotype. 2. Immunostain for E-cadherin shows strong  membranous staining, consistent with a ductal phenotype. This was found to be concordant by Dr. Theda Sers. Recommendation: Surgical and oncology referrals. Results and recommendations were relayed to Derrek Monaco, NP by phone by Jetta Lout, [place] on 05/10/18. At the patient's request, results and recommendations were relayed to the patient by phone by Dr. Theda Sers on 05/10/18. The patient stated she has a rash and blisters at the Steri-Strip bandage. Dr. Theda Sers instructed the patient to remove the Steri-Strips and to cover with sterile gauze. She was asked to contact Dr. Theda Sers at the [facility] with any further questions or concerns. The patient requested that her referral be made in [place]. A surgical referral was made with Dr. Dalbert Batman at  [place] Surgery for 05/16/18 at 11:45 AM by Jetta Lout, RRA. The patient has been notified of the appointment information. Addendum by Jetta Lout, RRA on 05/10/18. Electronically Signed   By: Ammie Ferrier M.D.   On: 05/10/2018 17:57   Result Date: 05/13/2018 CLINICAL DATA:  37 year old female presenting for ultrasound-guided biopsy of 2 masses in the left breast. EXAM: ULTRASOUND GUIDED LEFT BREAST CORE NEEDLE BIOPSY COMPARISON:  Previous exam(s). FINDINGS: I met with the patient and we discussed the procedure of ultrasound-guided biopsy, including benefits and alternatives. We discussed the high likelihood of a successful procedure. We discussed the risks of the procedure, including infection, bleeding, tissue injury, clip migration, and inadequate sampling. Informed written consent was given. The usual time-out protocol was performed immediately prior to the procedure. Lesion quadrant: Upper-inner quadrant Using sterile technique and 1% Lidocaine as local anesthetic, under direct ultrasound visualization, a 14 gauge spring-loaded device was used to perform biopsy of a mass in the left breast at 9:30, 2 cm from the nipple using  a inferomedial approach. At the conclusion of the procedure a ribbon shaped tissue marker clip was deployed into the biopsy cavity. ------------------------------------------------------------------------------------------------------------------------------------------------------------------------------------ Per the original diagnostic report, there was concern regarding an additional possible mass in the left breast. This mass was seen at 9:30, 3 cm from the nipple measuring 1.0 x 0.8 x 0.8 cm. Lesion quadrant: Upper-inner quadrant Using sterile technique and 1% Lidocaine as local anesthetic, under direct ultrasound visualization, a 14 gauge spring-loaded device was used to perform biopsy of a mass in the left breast at 9:30, 3 cm from the nipple using a inferior approach. At the conclusion of the procedure a wing shaped tissue marker clip was deployed into the biopsy cavity. Follow up 2 view mammogram was performed and dictated separately. IMPRESSION: 1. Ultrasound guided biopsy of a mass in the left breast at 9:30, 2 cm from the nipple. No apparent complications. 2. Ultrasound-guided biopsy of a mass in the left breast at 9:30, 3 cm from the nipple. No apparent complications. Electronically Signed: By: Ammie Ferrier M.D. On: 05/07/2018 09:33   [place] Lt Breast Bx W Loc Dev Ea Add Lesion Img Bx Spec US Guide  Addendum Date: 05/13/2018   ADDENDUM REPORT: 05/10/2018 17:57 ADDENDUM: Pathology of the left breast biopsy 9:30 o'clock 3 cm from nipple revealed INVASIVE MAMMARY CARCINOMA, GRADE II. MAMMARY CARCINOMA IN SITU. Pathology of the left breast biopsy, 9:30 o'clock, 2 cm from the nipple revealed INVASIVE DUCTAL CARCINOMA, GRADE I. MAMMARY CARCINOMA IN SITU. Immunohistochemical stain for E-cadherin for characterization of invasive carcinoma in part 1 and in situ carcinoma in part 1 and 2 revealed 1. Immunostain for E-cadherin shows relatively faint, cytoplasmic and somewhat membranous staining, suggestive  of a ductal phenotype. 2. Immunostain for E-cadherin shows strong membranous staining, consistent with a ductal phenotype. This was found to be concordant by Dr. Theda Sers. Recommendation: Surgical and oncology referrals. Results and recommendations were relayed to Derrek Monaco, NP by phone by Jetta Lout, [place] on 05/10/18. At the patient's request, results and recommendations were relayed to the patient by phone by Dr. Theda Sers on 05/10/18. The patient stated she has a rash and blisters at the Steri-Strip bandage. Dr. Theda Sers instructed the patient to remove the Steri-Strips and to cover with sterile gauze. She was asked to contact Dr. Theda Sers at the [facility] of [place] with any further questions or concerns. The patient requested that her referral be made in [place]. A surgical referral was made with  Dr. Dalbert Batman at [facility] Surgery for 05/16/18 at 11:45 AM by Jetta Lout, [place]. The patient has been notified of the appointment information. Addendum by Jetta Lout, RRA on 05/10/18. Electronically Signed   By: Ammie Ferrier M.D.   On: 05/10/2018 17:57   Result Date: 05/13/2018 CLINICAL DATA:  37 year old female presenting for ultrasound-guided biopsy of 2 masses in the left breast. EXAM: ULTRASOUND GUIDED LEFT BREAST CORE NEEDLE BIOPSY COMPARISON:  Previous exam(s). FINDINGS: I met with the patient and we discussed the procedure of ultrasound-guided biopsy, including benefits and alternatives. We discussed the high likelihood of a successful procedure. We discussed the risks of the procedure, including infection, bleeding, tissue injury, clip migration, and inadequate sampling. Informed written consent was given. The usual time-out protocol was performed immediately prior to the procedure. Lesion quadrant: Upper-inner quadrant Using sterile technique and 1% Lidocaine as local anesthetic, under direct ultrasound visualization, a 14 gauge spring-loaded device was used to perform  biopsy of a mass in the left breast at 9:30, 2 cm from the nipple using a inferomedial approach. At the conclusion of the procedure a ribbon shaped tissue marker clip was deployed into the biopsy cavity. ------------------------------------------------------------------------------------------------------------------------------------------------------------------------------------ Per the original diagnostic report, there was concern regarding an additional possible mass in the left breast. This mass was seen at 9:30, 3 cm from the nipple measuring 1.0 x 0.8 x 0.8 cm. Lesion quadrant: Upper-inner quadrant Using sterile technique and 1% Lidocaine as local anesthetic, under direct ultrasound visualization, a 14 gauge spring-loaded device was used to perform biopsy of a mass in the left breast at 9:30, 3 cm from the nipple using a inferior approach. At the conclusion of the procedure a wing shaped tissue marker clip was deployed into the biopsy cavity. Follow up 2 view mammogram was performed and dictated separately. IMPRESSION: 1. Ultrasound guided biopsy of a mass in the left breast at 9:30, 2 cm from the nipple. No apparent complications. 2. Ultrasound-guided biopsy of a mass in the left breast at 9:30, 3 cm from the nipple. No apparent complications. Electronically Signed: By: Ammie Ferrier M.D. On: 05/07/2018 09:33    ELIGIBLE FOR AVAILABLE RESEARCH PROTOCOL: no  ASSESSMENT: 37 y.o. [facility] woman, status post left breast biopsy x2 on 05/07/2018, showing  (a) left breast upper inner quadrant 2 cm from the nipple: a clinical T1b N0, stage IA invasive ductal carcinoma, grade 1, estrogen and progesterone receptor strongly positive, HER-2 not amplified, with an MIB-1 of 10%; strongly E-cadherin positive  (b) left breast upper inner quadrant 3 cm from the nipple, a clinical T1c N0, stage IA invasive ductal carcinoma, grade 2, estrogen receptor positive with moderate staining intensity,  progesterone receptor positive with strong staining intensity, with an MIB-1 of 10%, and no HER-2 amplification; faint but positive E-cadherin stain  (1) genetics testing 05/22/2018  (2) definitive surgery to follow  (3) Oncotype DX or Mammaprint to be obtained from the more aggressive tumor  (4) adjuvant radiation as appropriate  (5) antiestrogens to follow: Consider goserelin  PLAN: I spent approximately 60 minutes face to face with Larene Beach with more than 50% of that time spent in counseling and coordination of care. Specifically we reviewed the biology of the patient's diagnosis and the specifics of her situation.  We first reviewed the fact that cancer is not one disease but more than 100 different diseases and that it is important to keep them separate-- otherwise when friends and relatives discuss their own cancer experiences with [facility] confusion  can result. Similarly we explained that if breast cancer spreads to the bone or liver, the patient would not have bone cancer or liver cancer, but breast cancer in the bone and breast cancer in the liver: one cancer in three places-- not 3 different cancers which otherwise would have to be treated in 3 different ways.  We discussed the difference between local and systemic therapy. In terms of loco-regional treatment, lumpectomy plus radiation is equivalent to mastectomy as far as survival is concerned. For this reason, and because the cosmetic results are generally superior, we recommend breast conserving surgery.  However, since there are 2 separate tumors, and 1 of them has some apparent lobular features, we will obtain a breast MRI to help [place] decide on the extent of surgery  We then discussed the rationale for systemic therapy. There is some risk that this cancer may have already spread to other parts of her body. Patients frequently ask at this point about bone scans, CAT scans and PET scans to find out if they have occult breast cancer  somewhere else. The problem is that in early stage disease we are much more likely to find false positives then true cancers and this would expose the patient to unnecessary procedures as well as unnecessary radiation. Scans cannot answer the question the patient really would like to know, which is whether she has microscopic disease elsewhere in her body. For those reasons we do not recommend them.  Of course we would proceed to aggressive evaluation of any symptoms that might suggest metastatic disease, but that is not the case here.  Next we went over the options for systemic therapy which are anti-estrogens, anti-HER-2 immunotherapy, and chemotherapy. Lamiyah does not meet criteria for anti-HER-2 immunotherapy. She is a good candidate for anti-estrogens.  The question of chemotherapy is more complicated. Chemotherapy is most effective in rapidly growing, aggressive tumors. It is much less effective in low-to intermediate grade, slow growing cancers, like Nadelyn 's. For that reason we are going to request an Oncotype from the more aggressive appearing of the 2 tumors, as suggested by NCCN guidelines. That will help [place] make a definitive decision regarding chemotherapy in this case.  The overall sequence then will be to start with surgery, then if chemotherapy is to be done that would be next, then if radiation is appropriate that would be next.  We would then proceed with antiestrogens for a minimum of 5 years.  We have asked Niala to have her etonogestrel implant removed.  For contraception she has several options including a copper IUD, bilateral tubal ligation, or, if she chooses tamoxifen for her antiestrogen, then a Mirena IUD would be optimal.  However my recommendation to her is that she consider goserelin, which will synergize with her antiestrogen, whether tamoxifen or anastrozole.  We will discuss this at more length when she returns to see me after her surgery  Annslee has a good  understanding of the overall plan. She agrees with it. She knows the goal of treatment in her case is cure. She will call with any problems that may develop before her next visit here.  Magrinat, Virgie Dad, MD  05/23/18 8:28 AM Medical Oncology and Hematology [facility] [address] [place], [place] [address] Tel. [phone]    Fax. [phone]    I, Soijett Blue am acting as scribe for Dr. Sarajane Jews C. Magrinat.  Lindie Spruce MD, have reviewed the above documentation for accuracy and completeness, and I agree  with the above.

## 2018-05-23 ENCOUNTER — Encounter: Payer: Self-pay | Admitting: General Practice

## 2018-05-23 ENCOUNTER — Other Ambulatory Visit: Payer: Self-pay | Admitting: *Deleted

## 2018-05-23 ENCOUNTER — Telehealth: Payer: Self-pay | Admitting: *Deleted

## 2018-05-23 ENCOUNTER — Telehealth: Payer: Self-pay | Admitting: Oncology

## 2018-05-23 DIAGNOSIS — Z170 Estrogen receptor positive status [ER+]: Principal | ICD-10-CM

## 2018-05-23 DIAGNOSIS — C50812 Malignant neoplasm of overlapping sites of left female breast: Secondary | ICD-10-CM

## 2018-05-23 NOTE — Telephone Encounter (Signed)
Per 11/6 los.  Called patient with appt date/time.

## 2018-05-23 NOTE — Telephone Encounter (Signed)
Called pt and gave navigation resources and contact information. 

## 2018-05-23 NOTE — Progress Notes (Signed)
[facility] Psychosocial Distress Screening Clinical Social Work  Clinical Social Work was referred by distress screening protocol.  The patient scored a 6 on the Psychosocial Distress Thermometer which indicates moderate distress. Clinical Social Worker contacted patient by phone to assess for distress and other psychosocial needs. Patients major concern is financial impact of being unable to work as result of treatment.  Is investigating short term disability coverage.  Supportive family, work has also been supportive.  Discussed various options for financial assistance including [facility] and [place].  Encouraged patient to call as needed for help/resources.    ONCBCN DISTRESS SCREENING 05/22/2018  Screening Type Initial Screening  Distress experienced in past week (1-10) 6  Practical problem type Work/school  Emotional problem type Nervousness/Anxiety;Adjusting to illness  Information Concerns Type Lack of info about diagnosis;Lack of info about treatment  Physical Problem type Pain    Clinical Social Worker follow up needed: No.  If yes, follow up plan:  Beverely Pace, [place], LCSW Clinical Social Worker Phone:  [phone]

## 2018-05-24 ENCOUNTER — Encounter: Payer: Self-pay | Admitting: Radiation Oncology

## 2018-05-27 ENCOUNTER — Encounter: Payer: Self-pay | Admitting: Radiation Oncology

## 2018-05-28 ENCOUNTER — Encounter: Payer: Self-pay | Admitting: Genetics

## 2018-05-28 ENCOUNTER — Ambulatory Visit: Payer: Self-pay | Admitting: Genetics

## 2018-05-28 ENCOUNTER — Telehealth: Payer: Self-pay | Admitting: Genetics

## 2018-05-28 DIAGNOSIS — Z1379 Encounter for other screening for genetic and chromosomal anomalies: Secondary | ICD-10-CM | POA: Insufficient documentation

## 2018-05-28 DIAGNOSIS — Z801 Family history of malignant neoplasm of trachea, bronchus and lung: Secondary | ICD-10-CM

## 2018-05-28 DIAGNOSIS — C50812 Malignant neoplasm of overlapping sites of left female breast: Secondary | ICD-10-CM

## 2018-05-28 DIAGNOSIS — Z8051 Family history of malignant neoplasm of kidney: Secondary | ICD-10-CM

## 2018-05-28 DIAGNOSIS — Z8052 Family history of malignant neoplasm of bladder: Secondary | ICD-10-CM

## 2018-05-28 DIAGNOSIS — Z800 Family history of malignant neoplasm of digestive organs: Secondary | ICD-10-CM

## 2018-05-28 DIAGNOSIS — Z170 Estrogen receptor positive status [ER+]: Principal | ICD-10-CM

## 2018-05-28 NOTE — Telephone Encounter (Signed)
Revealed negative genetic testing.    This normal result is reassuring and indicates that it is unlikely Breanna [name]'s cancer is due to a hereditary cause.  It is unlikely that there is an increased risk of another cancer due to a mutation in one of these genes.  However, genetic testing is not perfect, and cannot definitively rule out a hereditary cause.  It will be important for her to keep in contact with genetics to learn if any additional testing may be needed in the future.     Still recommended her father have genetic testing due to his renal cell caner dx under 50.  Breanna [name] states that she was able to find out more about her father's renal cancer- it was simple renal cell carcinoma.

## 2018-05-28 NOTE — Progress Notes (Signed)
HPI:  Breanna [name] was previously seen in the [facility] clinic on 05/22/2018 due to a personal and family history of cancer and concerns regarding a hereditary predisposition to cancer. Please refer to our prior cancer genetics clinic note for more information regarding Breanna [name]'s medical, social and family histories, and our assessment and recommendations, at the time. Breanna [name]'s recent genetic test results were disclosed to her, as well as recommendations warranted by these results. These results and recommendations are discussed in more detail below.  CANCER HISTORY:    Malignant neoplasm of overlapping sites of left breast in female, estrogen receptor positive ([place])   05/22/2018 Initial Diagnosis    Malignant neoplasm of overlapping sites of left breast in female, estrogen receptor positive ([place])    05/27/2018 Cancer Staging    Staging form: Breast, AJCC 8th Edition - Clinical: Stage IA (cT1c, cN0, cM0, G2, ER+, PR+, HER2-) - Signed by Eppie Gibson, MD on 05/27/2018      FAMILY HISTORY:  We obtained a detailed, 4-generation family history.  Significant diagnoses are listed below: Family History  Problem Relation Age of Onset  . COPD Mother   . Pneumonia Mother   . Hyperlipidemia Father   . Kidney cancer Father 20       "simple renal cell carcinoma"  . Heart disease Maternal Aunt   . Hypertension Maternal Grandmother   . Diabetes Maternal Grandmother   . Stroke Paternal Grandmother   . Hypertension Paternal Grandmother   . Diabetes Paternal Grandmother   . Stomach cancer Paternal Grandmother 25       had a portion of stomah removed  . Diabetes Paternal Grandfather   . Heart disease Paternal Grandfather   . Hyperlipidemia Paternal Grandfather   . Hypertension Paternal Grandfather   . Bladder Cancer Maternal Uncle        hx smoking  . Lung cancer Other   . Colon cancer Neg Hx     Breanna [name] has a 58 year-old son and a 14 year-old daughter with no hx of  cancer. She had a miscarriage previously as well. She is an only child.   Breanna [name]'s father: 54, hx of kidney cancer dx at 87.  Was dx early, only surgery needed. Paternal aunts/Uncles: 4 paternal uncles, no hx of cancer. Paternal cousins: no hx of cancer known Paternal grandfather: died at 91, no hx of cancer.  His sister had lung cancer (patient's great aunt) Paternal grandmother:dx with stomach cancer at 12, now 22  Breanna [name]'s mother: died at 32 due to COPD, pneumonia.  She had a hysterectomy at 82. She had breast calcifications.  Maternal Aunts/Uncles: 2 maternal aunts, 2 maternal uncles. 1 uncle had bladder cancer, he has a hx of smoking Maternal cousins: no known hx of cancer Maternal grandfather: died in 59's, unk cause Maternal grandmother:84, no hx of cancer.  Her mother lived to 89 (patient's great grandmother)  Breanna [name] is unaware of previous family history of genetic testing for hereditary cancer risks. Patient's maternal ancestors are of N. European/[place]rokee descent, and paternal ancestors are of N. European descent. There is no reported Ashkenazi Jewish ancestry. There is no known consanguinity.  GENETIC TEST RESULTS: Genetic testing performed through [facility]'s Multi-Cancer Panel reported out on 05/28/2018 showed no pathogenic mutations. The Multi-Cancer Panel offered by [facility] includes sequencing and/or deletion duplication testing of the following 91 genes: AIP, ALK, APC, ATM, AXIN2, BAP1, BARD1, BLM, BMPR1A, BRCA1, BRCA2, BRIP1, BUB1B, CASR, CDC73, CDH1, CDK4, CDKN1B, CDKN1C,  CDKN2A, CEBPA, CEP57, CHEK2, CTNNA1, DICER1, DIS3L2, EGFR, ENG, EPCAM, FH, FLCN, GALNT12, GATA2, GPC3, GREM1, HOXB13, HRAS, KIT, MAX, MEN1, MET, MITF, MLH1, MLH3, MSH2, MSH3, MSH6, MUTYH, NBN, NF1, NF2, NTHL1, PALB2, PDGFRA, PHOX2B, PMS2, POLD1, POLE, POT1, PRKAR1A, PTCH1, PTEN, RAD50, RAD51C, RAD51D, RB1, RECQL4, RET, RNF43, RPS20, RUNX1, SDHA, SDHAF2, SDHB, SDHC, SDHD, SMAD4, SMARCA4, SMARCB1,  SMARCE1, STK11, SUFU, TERC, TERT, TMEM127, TP53, TSC1, TSC2, VHL, WRN, WT1  The test report will be scanned into EPIC and will be located under the Molecular Pathology section of the Results Review tab. A portion of the result report is included below for reference.     We discussed with Breanna [name] that because current genetic testing is not perfect, it is possible there may be a gene mutation in one of these genes that current testing cannot detect, but that chance is small.  We also discussed, that there could be another gene that has not yet been discovered, or that we have not yet tested, that is responsible for the cancer diagnoses in the family. It is also possible there is a hereditary cause for the cancer in the family that Breanna [name] did not inherit and therefore was not identified in her testing.  Therefore, it is important to remain in touch with cancer genetics in the future so that we can continue to offer Breanna [name] the most up to date genetic testing.   ADDITIONAL GENETIC TESTING: We discussed with Breanna [name] that her genetic testing was fairly extensive.  If there are are genes identified to increase cancer risk that can be analyzed in the future, we would be happy to discuss and coordinate this testing at that time.    CANCER SCREENING RECOMMENDATIONS: Breanna [name]'s test result is negative (normal).  This means that we have not identified a hereditary cause for her personal and family history of cancer at this time.   While reassuring, this does not definitively rule out a hereditary predisposition to cancer. It is still possible that there could be genetic mutations that are undetectable by current technology, or genetic mutations in genes that have not been tested or identified to increase cancer risk.  Therefore, it is recommended she continue to follow the cancer management and screening guidelines provided by her oncology and primary healthcare provider. An individual's cancer risk  is not determined by genetic test results alone.  Overall cancer risk assessment includes additional factors such as personal medical history, family history, etc.  These should be used to make a personalized plan for cancer prevention and surveillance.    RECOMMENDATIONS FOR FAMILY MEMBERS:  Relatives in this family might be at some increased risk of developing cancer, over the general population risk, simply due to the family history of cancer.  We recommended women in this family have a yearly mammogram beginning at age 16, or 61 years younger than the earliest onset of cancer, an annual clinical breast exam, and perform monthly breast self-exams. Women in this family should also have a gynecological exam as recommended by their primary provider. All family members should have a colonoscopy by age 32 (or as directed by their doctors).  All family members should inform their physicians about the family history of cancer so their doctors can make the most appropriate screening recommendations for them.   We recommend her daughter start annual breast screening at age 34 (43 years prior to her dx).  It is also possible there is a hereditary cause for the cancer in Breanna [name]'s  family that she did not inherit and therefore was not identified in her.   Therefore, we recommended her father who had renal cell carcinoma under 80 also have genetic counseling and testing. Breanna [name] will let us know if we can be of any assistance in coordinating genetic counseling and/or testing for these family members.   FOLLOW-UP: Lastly, we discussed with Breanna [name] that cancer genetics is a rapidly advancing field and it is possible that new genetic tests will be appropriate for her and/or her family members in the future. We encouraged her to remain in contact with cancer genetics on an annual basis so we can update her personal and family histories and let her know of advances in cancer genetics that may benefit this family.    Our contact number was provided. Breanna [name]'s questions were answered to her satisfaction, and she knows she is welcome to call us at anytime with additional questions or concerns.   Ferol Luz, MS, [facility] Certified Genetic Counselor .[email or web address]

## 2018-05-29 ENCOUNTER — Encounter: Payer: Self-pay | Admitting: Advanced Practice Midwife

## 2018-05-29 ENCOUNTER — Ambulatory Visit: Payer: 59 | Admitting: Advanced Practice Midwife

## 2018-05-29 ENCOUNTER — Ambulatory Visit
Admission: RE | Admit: 2018-05-29 | Discharge: 2018-05-29 | Disposition: A | Discharge status: Home / Self Care | Payer: 59 | Source: Ambulatory Visit | Attending: General Surgery | Admitting: General Surgery

## 2018-05-29 ENCOUNTER — Other Ambulatory Visit: Payer: Self-pay

## 2018-05-29 VITALS — BP 118/73 | HR 85 | Ht 65.0 in | Wt 260.0 lb

## 2018-05-29 DIAGNOSIS — Z3049 Encounter for surveillance of other contraceptives: Secondary | ICD-10-CM | POA: Diagnosis not present

## 2018-05-29 DIAGNOSIS — C50212 Malignant neoplasm of upper-inner quadrant of left female breast: Secondary | ICD-10-CM

## 2018-05-29 DIAGNOSIS — Z3046 Encounter for surveillance of implantable subdermal contraceptive: Secondary | ICD-10-CM

## 2018-05-29 DIAGNOSIS — D0512 Intraductal carcinoma in situ of left breast: Secondary | ICD-10-CM | POA: Diagnosis not present

## 2018-05-29 MED ORDER — GADOBUTROL 1 MMOL/ML IV SOLN
10.0000 mL | Freq: Once | INTRAVENOUS | Status: AC | PRN
  Administered 2018-05-29: 10 mL via INTRAVENOUS

## 2018-05-29 NOTE — Progress Notes (Signed)
HPI:  Breanna [name] 37 y.o. here for Nexplanon removal.  Her future plans for birth control are unsure. 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  Paragard/BTL recommended, but pt doesn't like having periods, so may go w/GNRH antagonist (warned of menopausal SE).  IF goes w/the paragard, will premedicated w/cytotec.  Past Medical History: Past Medical History:  Diagnosis Date  . Allergy   . Anxiety   . Breast cancer ([place])   . Breast nodule 05/21/2014  . Family history of bladder cancer   . Family history of kidney cancer   . Family history of lung cancer   . Family history of stomach cancer   . GERD (gastroesophageal reflux disease)   . Obesity   . Pain of both breasts 05/21/2014  . Pseudotumor cerebri syndrome    takes diamox    Past Surgical History: Past Surgical History:  Procedure Laterality Date  . BIOPSY  11/08/2015   Procedure: BIOPSY;  Surgeon: Danie Binder, MD;  [facility];  Service: Endoscopy;;  Gastric bx and Gastric polyp bx  . CESAREAN SECTION     x 2  . CHOLECYSTECTOMY     age 43  . COLONOSCOPY N/A 11/08/2015   Procedure: COLONOSCOPY;  Surgeon: Danie Binder, MD;  [facility];  Service: Endoscopy;  Laterality: N/A;  1000  . ESOPHAGOGASTRODUODENOSCOPY N/A 11/08/2015   Procedure: ESOPHAGOGASTRODUODENOSCOPY (EGD);  Surgeon: Danie Binder, MD;  [facility];  Service: Endoscopy;  Laterality: N/A;  . HEMORRHOID BANDING N/A 11/08/2015   Procedure: HEMORRHOID BANDING;  Surgeon: Danie Binder, MD;  [facility];  Service: Endoscopy;  Laterality: N/A;  . SAVORY DILATION N/A 11/08/2015   Procedure: SAVORY DILATION;  Surgeon: Danie Binder, MD;  [facility];  Service: Endoscopy;  Laterality: N/A;  . WISDOM TOOTH EXTRACTION      Family History: Family History  Problem Relation Age of Onset  . COPD Mother   . Pneumonia Mother   . Hyperlipidemia Father   . Kidney cancer  Father 69       "simple renal cell carcinoma"  . Heart disease Maternal Aunt   . Hypertension Maternal Grandmother   . Diabetes Maternal Grandmother   . Stroke Paternal Grandmother   . Hypertension Paternal Grandmother   . Diabetes Paternal Grandmother   . Stomach cancer Paternal Grandmother 72       had a portion of stomah removed  . Diabetes Paternal Grandfather   . Heart disease Paternal Grandfather   . Hyperlipidemia Paternal Grandfather   . Hypertension Paternal Grandfather   . Bladder Cancer Maternal Uncle        hx smoking  . Lung cancer Other   . Colon cancer Neg Hx     Social History: Social History   Tobacco Use  . Smoking status: Never Smoker  . Smokeless tobacco: Never Used  Substance Use Topics  . Alcohol use: No  . Drug use: No    Allergies:  Allergies  Allergen Reactions  . Topamax [Topiramate] Other (See Comments)    Made her feel like a zombie; "drunk"  . Codeine Itching and Rash  . Latex Itching and Rash  . Penicillins Itching and Rash    Has patient had a PCN reaction causing immediate rash, facial/tongue/throat swelling, SOB or lightheadedness with hypotension: Yes Has patient had a PCN reaction causing severe rash involving mucus membranes or skin necrosis:  No Has patient had a PCN reaction that required hospitalization No Has patient had a PCN reaction occurring within the last 10 years: No If all of the above answers are "NO", then may proceed with Cephalosporin use.   . Sulfa Antibiotics Itching and Rash    Meds:  (Not in a hospital admission)    Patient given informed consent for removal of her Nexplanon, time out was performed.  Signed copy in the chart.  Appropriate time out taken. Implanon site identified.  Area prepped in usual sterile fashon. One cc of 1% lidocaine was used to anesthetize the area at the distal end of the implant. A small stab incision was made right beside the implant on the distal portion.  The Nexplanon rod was  grasped using hemostats and removed without difficulty.  There was less than 3 cc blood loss. There were no complications.  A small amount of antibiotic ointment and steri-strips were applied over the small incision.  A pressure bandage was applied to reduce any bruising.  The patient tolerated the procedure well and was given post procedure instructions.

## 2018-06-06 ENCOUNTER — Other Ambulatory Visit: Payer: Self-pay | Admitting: General Surgery

## 2018-06-06 DIAGNOSIS — K219 Gastro-esophageal reflux disease without esophagitis: Secondary | ICD-10-CM | POA: Diagnosis not present

## 2018-06-06 DIAGNOSIS — Z170 Estrogen receptor positive status [ER+]: Principal | ICD-10-CM

## 2018-06-06 DIAGNOSIS — C50212 Malignant neoplasm of upper-inner quadrant of left female breast: Secondary | ICD-10-CM | POA: Diagnosis not present

## 2018-06-06 DIAGNOSIS — G932 Benign intracranial hypertension: Secondary | ICD-10-CM | POA: Diagnosis not present

## 2018-06-07 ENCOUNTER — Other Ambulatory Visit: Payer: Self-pay | Admitting: General Surgery

## 2018-06-07 ENCOUNTER — Encounter: Payer: Self-pay | Admitting: *Deleted

## 2018-06-07 DIAGNOSIS — Z170 Estrogen receptor positive status [ER+]: Principal | ICD-10-CM

## 2018-06-07 DIAGNOSIS — C50212 Malignant neoplasm of upper-inner quadrant of left female breast: Secondary | ICD-10-CM

## 2018-06-11 ENCOUNTER — Telehealth: Payer: Self-pay | Admitting: Oncology

## 2018-06-11 ENCOUNTER — Encounter (HOSPITAL_BASED_OUTPATIENT_CLINIC_OR_DEPARTMENT_OTHER): Payer: Self-pay | Admitting: *Deleted

## 2018-06-11 ENCOUNTER — Other Ambulatory Visit: Payer: Self-pay

## 2018-06-11 DIAGNOSIS — R059 Cough, unspecified: Secondary | ICD-10-CM

## 2018-06-11 DIAGNOSIS — R0981 Nasal congestion: Secondary | ICD-10-CM

## 2018-06-11 HISTORY — DX: Cough, unspecified: R05.9

## 2018-06-11 HISTORY — DX: Nasal congestion: R09.81

## 2018-06-11 NOTE — Pre-Procedure Instructions (Signed)
To go to [facility] for CBC, diff and CMET; to come here to pick up Ensure pre-surgery drink 10 oz. - to drink by 1000 DOS; to pick up CHG soap to use in shower night before surgery and AM DOS.

## 2018-06-11 NOTE — Telephone Encounter (Signed)
Scheduled appt per 11/25 sch message - pt is aware of apt date and time   

## 2018-06-17 ENCOUNTER — Encounter (HOSPITAL_COMMUNITY)
Admission: RE | Admit: 2018-06-17 | Discharge: 2018-06-17 | Disposition: A | Discharge status: Home / Self Care | Payer: 59 | Source: Ambulatory Visit | Attending: General Surgery | Admitting: General Surgery

## 2018-06-17 DIAGNOSIS — Z01812 Encounter for preprocedural laboratory examination: Secondary | ICD-10-CM | POA: Insufficient documentation

## 2018-06-17 LAB — CBC WITH DIFFERENTIAL/PLATELET
Abs Immature Granulocytes: 0.05 10*3/uL (ref 0.00–0.07)
Basophils Absolute: 0.1 10*3/uL (ref 0.0–0.1)
Basophils Relative: 1 %
EOS PCT: 4 %
Eosinophils Absolute: 0.4 10*3/uL (ref 0.0–0.5)
HEMATOCRIT: 42.9 % (ref 36.0–46.0)
HEMOGLOBIN: 14.3 g/dL (ref 12.0–15.0)
Immature Granulocytes: 1 %
LYMPHS PCT: 23 %
Lymphs Abs: 2.4 10*3/uL (ref 0.7–4.0)
MCH: 32.4 pg (ref 26.0–34.0)
MCHC: 33.3 g/dL (ref 30.0–36.0)
MCV: 97.3 fL (ref 80.0–100.0)
MONO ABS: 0.6 10*3/uL (ref 0.1–1.0)
MONOS PCT: 6 %
Neutro Abs: 6.7 10*3/uL (ref 1.7–7.7)
Neutrophils Relative %: 65 %
Platelets: 282 10*3/uL (ref 150–400)
RBC: 4.41 MIL/uL (ref 3.87–5.11)
RDW: 12.5 % (ref 11.5–15.5)
WBC: 10.1 10*3/uL (ref 4.0–10.5)
nRBC: 0 % (ref 0.0–0.2)

## 2018-06-17 LAB — COMPREHENSIVE METABOLIC PANEL
ALK PHOS: 41 U/L (ref 38–126)
ALT: 33 U/L (ref 0–44)
ANION GAP: 6 (ref 5–15)
AST: 25 U/L (ref 15–41)
Albumin: 3.8 g/dL (ref 3.5–5.0)
BILIRUBIN TOTAL: 0.8 mg/dL (ref 0.3–1.2)
BUN: 11 mg/dL (ref 6–20)
CALCIUM: 8.6 mg/dL — AB (ref 8.9–10.3)
CO2: 23 mmol/L (ref 22–32)
CREATININE: 1.2 mg/dL — AB (ref 0.44–1.00)
Chloride: 109 mmol/L (ref 98–111)
GFR, EST NON AFRICAN AMERICAN: 57 mL/min — AB (ref >60)
Glucose, Bld: 116 mg/dL — ABNORMAL HIGH (ref 70–99)
Potassium: 3.5 mmol/L (ref 3.5–5.1)
Sodium: 138 mmol/L (ref 135–145)
TOTAL PROTEIN: 7.1 g/dL (ref 6.5–8.1)

## 2018-06-20 ENCOUNTER — Ambulatory Visit
Admission: RE | Admit: 2018-06-20 | Discharge: 2018-06-20 | Disposition: A | Discharge status: Home / Self Care | Payer: 59 | Source: Ambulatory Visit | Attending: General Surgery | Admitting: General Surgery

## 2018-06-20 DIAGNOSIS — C50212 Malignant neoplasm of upper-inner quadrant of left female breast: Secondary | ICD-10-CM

## 2018-06-20 DIAGNOSIS — Z170 Estrogen receptor positive status [ER+]: Principal | ICD-10-CM

## 2018-06-20 DIAGNOSIS — R928 Other abnormal and inconclusive findings on diagnostic imaging of breast: Secondary | ICD-10-CM | POA: Diagnosis not present

## 2018-06-20 NOTE — Progress Notes (Signed)
Ensure pre surgery drink given with instructions to complete by [place], surgical soap given with instructions, pt verbalized understanding.

## 2018-06-21 NOTE — H&P (Signed)
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 Original imaging at [facility]. [facility]. She has pseudotumor cerebri but has not seen a neurologist in over 3 years. She has now seen Dr. Jana Hakim, Dr. Isidore Moos, and genetic counseling.       Noted some thickening in the left breast inferiorly and lower outer and went for imaging. Imaging of the lower outer quadrant was benign but they incidentally and unexpectedly found two small cancers in the left breast upper inner quadrant. The first cancers at the 9:30 position of the left breast, 3 cm from the nipple, 1.1 cm, spiculated The second cancer was also the 9:30 position, 2 cm from the nipple.  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 Low proliferation index.     Genetics is negative      MRI shows 2 masses in the left upper inner quadrant with a total span of 3.3 cm Otherwise no mass or adenopathy on either side I have reviewed this with her      Comorbidities include BMI 42. GERD with esophageal dilatation. Laparoscopic cholecystectomy. Hemorrhoids have been banded. Pseudotumor cerebri diagnosed 2010 with spinal tap. Headaches better on medication Family history is negative for breast cancer ovarian cancer colon cancer or prostate cancer. Paternal grandmother had gastric cancer. Father had renal cell cancer. Mother died of COPD and pneumonia Social history reveals she is married with 2 children. Lives in [place]. 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 Denies alcohol or tobacco.      We all had a very long discussion about the extent of surgery. We talked  about left lumpectomy with bracketed radioactive seed localization and sentinel lymph node biopsy and radiation therapy we talked about mastectomy with or without reconstruction. At the end of the discussion she is in favor of breast conservation surgery and I think she is a good candidate for that       She'll be scheduled for left breast lumpectomy with bracketed seed localization, left axillary sentinel lymph node biopsy. Oncotype planned. I discussed the indications, details, techniques, and numerous risk of the surgery with her and her family. She is aware of the risk of bleeding, infection, reoperation for positive margins or positive nodes, cosmetic deformity, chronic pain, shoulder disability, arm swelling, arm numbness. She seems to understand all these issues very well and all her questions are answered. She agrees with this plan.  She is going to follow-up with Dr. Jana Hakim of December 20     Problem List/Past Medical  PRIMARY CANCER OF UPPER INNER QUADRANT OF LEFT FEMALE BREAST (C50.212)  PSEUDOTUMOR CEREBRI (G93.2)  GERD WITH STRICTURE (K21.9)  BMI 40.0-44.9, ADULT (Z68.41)  HISTORY OF LAPAROSCOPIC CHOLECYSTECTOMY (Z90.49)   Past Surgical History  Breast Biopsy  Left. Cesarean Section - Multiple  Gallbladder Surgery - Laparoscopic  Oral Surgery   Allergies  Topamax *ANTICONVULSANTS*  CODEINE  Latex  Penicillins  Sulfa Antibiotics   Medication History  acetaZOLAMIDE ER ('500MG'$  Capsule ER 12HR, Oral) Active. ALPRAZolam (0.'5MG'$  Tablet, Oral as needed) Active. Pantoprazole Sodium ('40MG'$  Tablet DR, Oral) Active. Zonisamide ('25MG'$  Capsule, Oral) Active. Acetaminophen ('500MG'$  Tablet, Oral) Active. Cetirizine  HCl ('10MG'$  Tablet, Oral) Active. Medications Reconciled  Social History  Caffeine use  Tea.  Family History Arthritis  Family Members In General. Cancer  Family Members In General. Cerebrovascular Accident  Family Members In  General. Diabetes Mellitus  Family Members In General. Hypertension  Family Members In General. Thyroid problems  Family Members In General.  Pregnancy / Birth History  Age at menarche  89 years. Contraceptive History  Contraceptive implant. Gravida  3 Maternal age  20-25  Other Problems  Anxiety Disorder  Back Pain  Gastroesophageal Reflux Disease   Vitals  Weight: 260.5 lb Height: 65in Body Surface Area: 2.21 m Body Mass Index: 43.35 kg/m  Pulse: 112 (Regular)  BP: 126/84 (Sitting, Left Arm, Standard)       Physical Exam  General Mental Status-Alert. General Appearance-Not in acute distress. Build & Nutrition-Well nourished. Posture-Normal posture. Gait-Normal. Note: BMI 42. Pleasant and cooperative. Excellent insight   Head and Neck Head-normocephalic, atraumatic with no lesions or palpable masses. Trachea-midline. Thyroid Gland Characteristics - normal size and consistency and no palpable nodules.  Chest and Lung Exam Chest and lung exam reveals -on auscultation, normal breath sounds, no adventitious sounds and normal vocal resonance.  Breast Note: Breasts are large. Not toxic. Biopsy site left noted. Blisters healing. No hematoma. No real palpable mass or adenopathy on either side.   Cardiovascular Cardiovascular examination reveals -normal heart sounds, regular rate and rhythm with no murmurs and femoral artery auscultation bilaterally reveals normal pulses, no bruits, no thrills.  Abdomen Inspection Inspection of the abdomen reveals - No Hernias. Palpation/Percussion Palpation and Percussion of the abdomen reveal - Soft, Non Tender, No Rigidity (guarding), No hepatosplenomegaly and No Palpable abdominal masses.  Neurologic Neurologic evaluation reveals -alert and oriented x 3 with no impairment of recent or remote memory, normal attention span and ability to concentrate, normal sensation and normal  coordination.  Musculoskeletal Normal Exam - Bilateral-Upper Extremity Strength Normal and Lower Extremity Strength Normal.    Assessment & Plan  PRIMARY CANCER OF UPPER INNER QUADRANT OF LEFT FEMALE BREAST (C50.212)   Your MRI shows the 2 cancers in the upper inner left breast MRI shows that all of the lymph nodes looked normal MRI shows no sign of cancer elsewhere in either breast  You have discussed your treatment plan with Dr. Jana Hakim and Dr. Isidore Moos Your genetic testing is negative which is good  We have talked for a long time about extent of surgery. We have discussed lumpectomy with 2 radioactive seeds, sentinel lymph node biopsy, radiation therapy, possible chemotherapy We have compare that to mastectomy with or without reconstruction You have decided to go ahead with the left lumpectomy with bracketed radioactive seeds and sentinel lymph node biopsy. I think you are a good candidate for that  We have discussed the indications, techniques, and risk of the surgery in detail with you and your family We will schedule this in the near future  PSEUDOTUMOR CEREBRI (G93.2) GERD WITH STRICTURE (K21.9) HISTORY OF LAPAROSCOPIC CHOLECYSTECTOMY (Z90.49) BMI 40.0-44.9, ADULT (Z68.41)    Maciel Kegg M. Dalbert Batman, M.D., [facility] General and Minimally invasive Surgery Breast and Colorectal Surgery Office:   [phone] Pager:   [phone]

## 2018-06-24 ENCOUNTER — Ambulatory Visit (HOSPITAL_COMMUNITY)
Admission: RE | Admit: 2018-06-24 | Discharge: 2018-06-24 | Disposition: A | Discharge status: Home / Self Care | Payer: 59 | Source: Ambulatory Visit | Attending: General Surgery | Admitting: General Surgery

## 2018-06-24 ENCOUNTER — Ambulatory Visit (HOSPITAL_BASED_OUTPATIENT_CLINIC_OR_DEPARTMENT_OTHER): Payer: 59 | Admitting: Anesthesiology

## 2018-06-24 ENCOUNTER — Ambulatory Visit
Admission: RE | Admit: 2018-06-24 | Discharge: 2018-06-24 | Disposition: A | Discharge status: Home / Self Care | Payer: 59 | Source: Ambulatory Visit | Attending: General Surgery | Admitting: General Surgery

## 2018-06-24 ENCOUNTER — Other Ambulatory Visit: Payer: Self-pay

## 2018-06-24 ENCOUNTER — Ambulatory Visit (HOSPITAL_BASED_OUTPATIENT_CLINIC_OR_DEPARTMENT_OTHER)
Admission: RE | Admit: 2018-06-24 | Discharge: 2018-06-24 | Disposition: A | Discharge status: Home / Self Care | Payer: 59 | Source: Ambulatory Visit | Attending: General Surgery | Admitting: General Surgery

## 2018-06-24 ENCOUNTER — Encounter (HOSPITAL_BASED_OUTPATIENT_CLINIC_OR_DEPARTMENT_OTHER)
Admission: RE | Disposition: A | Discharge status: Home / Self Care | Payer: Self-pay | Source: Ambulatory Visit | Attending: General Surgery

## 2018-06-24 ENCOUNTER — Encounter (HOSPITAL_BASED_OUTPATIENT_CLINIC_OR_DEPARTMENT_OTHER): Payer: Self-pay | Admitting: Certified Registered Nurse Anesthetist

## 2018-06-24 DIAGNOSIS — Z79899 Other long term (current) drug therapy: Secondary | ICD-10-CM | POA: Insufficient documentation

## 2018-06-24 DIAGNOSIS — Z170 Estrogen receptor positive status [ER+]: Principal | ICD-10-CM

## 2018-06-24 DIAGNOSIS — G932 Benign intracranial hypertension: Secondary | ICD-10-CM | POA: Diagnosis not present

## 2018-06-24 DIAGNOSIS — Z880 Allergy status to penicillin: Secondary | ICD-10-CM | POA: Insufficient documentation

## 2018-06-24 DIAGNOSIS — C50212 Malignant neoplasm of upper-inner quadrant of left female breast: Secondary | ICD-10-CM

## 2018-06-24 DIAGNOSIS — K219 Gastro-esophageal reflux disease without esophagitis: Secondary | ICD-10-CM | POA: Diagnosis not present

## 2018-06-24 DIAGNOSIS — Z888 Allergy status to other drugs, medicaments and biological substances status: Secondary | ICD-10-CM | POA: Diagnosis not present

## 2018-06-24 DIAGNOSIS — Z8249 Family history of ischemic heart disease and other diseases of the circulatory system: Secondary | ICD-10-CM | POA: Diagnosis not present

## 2018-06-24 DIAGNOSIS — Z6841 Body Mass Index (BMI) 40.0 and over, adult: Secondary | ICD-10-CM | POA: Diagnosis not present

## 2018-06-24 DIAGNOSIS — Z882 Allergy status to sulfonamides status: Secondary | ICD-10-CM | POA: Insufficient documentation

## 2018-06-24 DIAGNOSIS — Z885 Allergy status to narcotic agent status: Secondary | ICD-10-CM | POA: Diagnosis not present

## 2018-06-24 DIAGNOSIS — R928 Other abnormal and inconclusive findings on diagnostic imaging of breast: Secondary | ICD-10-CM | POA: Diagnosis not present

## 2018-06-24 DIAGNOSIS — C50812 Malignant neoplasm of overlapping sites of left female breast: Secondary | ICD-10-CM

## 2018-06-24 DIAGNOSIS — C50912 Malignant neoplasm of unspecified site of left female breast: Secondary | ICD-10-CM | POA: Diagnosis not present

## 2018-06-24 DIAGNOSIS — F419 Anxiety disorder, unspecified: Secondary | ICD-10-CM | POA: Insufficient documentation

## 2018-06-24 DIAGNOSIS — C773 Secondary and unspecified malignant neoplasm of axilla and upper limb lymph nodes: Secondary | ICD-10-CM | POA: Insufficient documentation

## 2018-06-24 DIAGNOSIS — G8918 Other acute postprocedural pain: Secondary | ICD-10-CM | POA: Diagnosis not present

## 2018-06-24 HISTORY — DX: Personal history of other diseases of the respiratory system: Z87.09

## 2018-06-24 HISTORY — PX: BREAST LUMPECTOMY WITH RADIOACTIVE SEED AND SENTINEL LYMPH NODE BIOPSY: SHX6550

## 2018-06-24 HISTORY — DX: Nasal congestion: R09.81

## 2018-06-24 HISTORY — PX: BREAST LUMPECTOMY: SHX2

## 2018-06-24 HISTORY — DX: Dental restoration status: Z98.811

## 2018-06-24 HISTORY — DX: Cough: R05

## 2018-06-24 HISTORY — DX: Other seasonal allergic rhinitis: J30.2

## 2018-06-24 HISTORY — DX: Palpitations: R00.2

## 2018-06-24 HISTORY — DX: Unspecified asthma, uncomplicated: J45.909

## 2018-06-24 HISTORY — DX: Unspecified osteoarthritis, unspecified site: M19.90

## 2018-06-24 HISTORY — DX: Cluster headache syndrome, unspecified, not intractable: G44.009

## 2018-06-24 LAB — POCT PREGNANCY, URINE: PREG TEST UR: NEGATIVE

## 2018-06-24 SURGERY — BREAST LUMPECTOMY WITH RADIOACTIVE SEED AND SENTINEL LYMPH NODE BIOPSY
Anesthesia: General | Site: Breast | Laterality: Left

## 2018-06-24 MED ORDER — CEFAZOLIN SODIUM-DEXTROSE 2-4 GM/100ML-% IV SOLN
INTRAVENOUS | Status: AC
  Filled 2018-06-24: qty 100

## 2018-06-24 MED ORDER — SODIUM CHLORIDE 0.9 % IV SOLN: 250.0000 mL | INTRAVENOUS | Status: DC | PRN

## 2018-06-24 MED ORDER — ACETAMINOPHEN 500 MG PO TABS: 1000.0000 mg | ORAL_TABLET | Freq: Four times a day (QID) | ORAL | Status: DC

## 2018-06-24 MED ORDER — SODIUM CHLORIDE 0.9% FLUSH: 3.0000 mL | INTRAVENOUS | Status: DC | PRN

## 2018-06-24 MED ORDER — CHLORHEXIDINE GLUCONATE CLOTH 2 % EX PADS: 6.0000 | MEDICATED_PAD | Freq: Once | CUTANEOUS | Status: DC

## 2018-06-24 MED ORDER — DEXAMETHASONE SODIUM PHOSPHATE 10 MG/ML IJ SOLN
INTRAMUSCULAR | Status: DC | PRN
  Administered 2018-06-24: 8 mg via INTRAVENOUS

## 2018-06-24 MED ORDER — FENTANYL CITRATE (PF) 100 MCG/2ML IJ SOLN
INTRAMUSCULAR | Status: AC
  Filled 2018-06-24: qty 2

## 2018-06-24 MED ORDER — LIDOCAINE HCL (CARDIAC) PF 100 MG/5ML IV SOSY
PREFILLED_SYRINGE | INTRAVENOUS | Status: DC | PRN
  Administered 2018-06-24: 60 mg via INTRAVENOUS

## 2018-06-24 MED ORDER — LACTATED RINGERS IV SOLN
INTRAVENOUS | Status: DC
  Administered 2018-06-24 (×2): via INTRAVENOUS

## 2018-06-24 MED ORDER — SCOPOLAMINE 1 MG/3DAYS TD PT72: 1.0000 | MEDICATED_PATCH | Freq: Once | TRANSDERMAL | Status: DC | PRN

## 2018-06-24 MED ORDER — ACETAMINOPHEN 325 MG PO TABS: 650.0000 mg | ORAL_TABLET | ORAL | Status: DC | PRN

## 2018-06-24 MED ORDER — OXYCODONE HCL 5 MG PO TABS: 5.0000 mg | ORAL_TABLET | Freq: Once | ORAL | Status: DC | PRN

## 2018-06-24 MED ORDER — ACETAMINOPHEN 500 MG PO TABS
ORAL_TABLET | ORAL | Status: AC
  Filled 2018-06-24: qty 2

## 2018-06-24 MED ORDER — TECHNETIUM TC 99M SULFUR COLLOID FILTERED
1.0000 | Freq: Once | INTRAVENOUS | Status: AC | PRN
  Administered 2018-06-24: 1 via INTRADERMAL

## 2018-06-24 MED ORDER — ACETAMINOPHEN 500 MG PO TABS
1000.0000 mg | ORAL_TABLET | ORAL | Status: AC
  Administered 2018-06-24: 1000 mg via ORAL

## 2018-06-24 MED ORDER — ONDANSETRON HCL 4 MG/2ML IJ SOLN
INTRAMUSCULAR | Status: AC
  Filled 2018-06-24: qty 2

## 2018-06-24 MED ORDER — HYDROCODONE-ACETAMINOPHEN 5-325 MG PO TABS: 1.0000 | ORAL_TABLET | Freq: Four times a day (QID) | ORAL | 0 refills | Status: DC | PRN

## 2018-06-24 MED ORDER — PROPOFOL 500 MG/50ML IV EMUL
INTRAVENOUS | Status: AC
  Filled 2018-06-24: qty 50

## 2018-06-24 MED ORDER — FENTANYL CITRATE (PF) 100 MCG/2ML IJ SOLN
50.0000 ug | INTRAMUSCULAR | Status: DC | PRN
  Administered 2018-06-24: 100 ug via INTRAVENOUS
  Administered 2018-06-24: 50 ug via INTRAVENOUS

## 2018-06-24 MED ORDER — LACTATED RINGERS IV SOLN: INTRAVENOUS | Status: DC

## 2018-06-24 MED ORDER — CELECOXIB 200 MG PO CAPS: 200.0000 mg | ORAL_CAPSULE | ORAL | Status: DC

## 2018-06-24 MED ORDER — FENTANYL CITRATE (PF) 100 MCG/2ML IJ SOLN
25.0000 ug | INTRAMUSCULAR | Status: DC | PRN
  Administered 2018-06-24 (×2): 50 ug via INTRAVENOUS

## 2018-06-24 MED ORDER — MIDAZOLAM HCL 2 MG/2ML IJ SOLN
1.0000 mg | INTRAMUSCULAR | Status: DC | PRN
  Administered 2018-06-24: 1 mg via INTRAVENOUS
  Administered 2018-06-24: 2 mg via INTRAVENOUS

## 2018-06-24 MED ORDER — GABAPENTIN 300 MG PO CAPS
ORAL_CAPSULE | ORAL | Status: AC
  Filled 2018-06-24: qty 1

## 2018-06-24 MED ORDER — DEXAMETHASONE SODIUM PHOSPHATE 10 MG/ML IJ SOLN
INTRAMUSCULAR | Status: AC
  Filled 2018-06-24: qty 1

## 2018-06-24 MED ORDER — GABAPENTIN 300 MG PO CAPS
300.0000 mg | ORAL_CAPSULE | ORAL | Status: AC
  Administered 2018-06-24: 300 mg via ORAL

## 2018-06-24 MED ORDER — ONDANSETRON HCL 4 MG/2ML IJ SOLN: 4.0000 mg | Freq: Four times a day (QID) | INTRAMUSCULAR | Status: DC | PRN

## 2018-06-24 MED ORDER — BUPIVACAINE-EPINEPHRINE (PF) 0.25% -1:200000 IJ SOLN
INTRAMUSCULAR | Status: DC | PRN
  Administered 2018-06-24: 20 mL

## 2018-06-24 MED ORDER — OXYCODONE HCL 5 MG/5ML PO SOLN: 5.0000 mg | Freq: Once | ORAL | Status: DC | PRN

## 2018-06-24 MED ORDER — SODIUM CHLORIDE 0.9% FLUSH: 3.0000 mL | Freq: Two times a day (BID) | INTRAVENOUS | Status: DC

## 2018-06-24 MED ORDER — LIDOCAINE 2% (20 MG/ML) 5 ML SYRINGE
INTRAMUSCULAR | Status: AC
  Filled 2018-06-24: qty 5

## 2018-06-24 MED ORDER — MIDAZOLAM HCL 2 MG/2ML IJ SOLN
INTRAMUSCULAR | Status: AC
  Filled 2018-06-24: qty 2

## 2018-06-24 MED ORDER — CELECOXIB 200 MG PO CAPS
ORAL_CAPSULE | ORAL | Status: AC
  Filled 2018-06-24: qty 1

## 2018-06-24 MED ORDER — FENTANYL CITRATE (PF) 100 MCG/2ML IJ SOLN
INTRAMUSCULAR | Status: DC | PRN
  Administered 2018-06-24: 50 ug via INTRAVENOUS
  Administered 2018-06-24: 25 ug via INTRAVENOUS
  Administered 2018-06-24: 50 ug via INTRAVENOUS
  Administered 2018-06-24: 25 ug via INTRAVENOUS

## 2018-06-24 MED ORDER — OXYCODONE HCL 5 MG PO TABS: 5.0000 mg | ORAL_TABLET | ORAL | Status: DC | PRN

## 2018-06-24 MED ORDER — SODIUM CHLORIDE (PF) 0.9 % IJ SOLN
INTRAVENOUS | Status: DC | PRN
  Administered 2018-06-24: 5 mL via INTRAMUSCULAR

## 2018-06-24 MED ORDER — FENTANYL CITRATE (PF) 100 MCG/2ML IJ SOLN: 25.0000 ug | INTRAMUSCULAR | Status: DC | PRN

## 2018-06-24 MED ORDER — PROPOFOL 10 MG/ML IV BOLUS
INTRAVENOUS | Status: DC | PRN
  Administered 2018-06-24: 50 mg via INTRAVENOUS
  Administered 2018-06-24: 250 mg via INTRAVENOUS

## 2018-06-24 MED ORDER — CEFAZOLIN SODIUM-DEXTROSE 2-4 GM/100ML-% IV SOLN
2.0000 g | INTRAVENOUS | Status: AC
  Administered 2018-06-24: 2 g via INTRAVENOUS

## 2018-06-24 MED ORDER — ONDANSETRON HCL 4 MG/2ML IJ SOLN
INTRAMUSCULAR | Status: DC | PRN
  Administered 2018-06-24: 4 mg via INTRAVENOUS

## 2018-06-24 MED ORDER — ACETAMINOPHEN 650 MG RE SUPP: 650.0000 mg | RECTAL | Status: DC | PRN

## 2018-06-24 MED ORDER — BUPIVACAINE-EPINEPHRINE (PF) 0.5% -1:200000 IJ SOLN
INTRAMUSCULAR | Status: DC | PRN
  Administered 2018-06-24: 25 mL via PERINEURAL

## 2018-06-24 SURGICAL SUPPLY — 58 items
ADH SKN CLS APL DERMABOND .7 (GAUZE/BANDAGES/DRESSINGS)
APPLIER CLIP 11 MED OPEN (CLIP) ×3
APR CLP MED 11 20 MLT OPN (CLIP) ×1
BINDER BREAST XXLRG (GAUZE/BANDAGES/DRESSINGS) ×2 IMPLANT
BLADE HEX COATED 2.75 (ELECTRODE) ×3 IMPLANT
BLADE SURG 15 STRL LF DISP TIS (BLADE) ×2 IMPLANT
BLADE SURG 15 STRL SS (BLADE) ×3
CANISTER SUCT 1200ML W/VALVE (MISCELLANEOUS) ×3 IMPLANT
CHLORAPREP W/TINT 26ML (MISCELLANEOUS) ×3 IMPLANT
CLIP APPLIE 11 MED OPEN (CLIP) ×1 IMPLANT
COVER BACK TABLE 60X90IN (DRAPES) ×3 IMPLANT
COVER MAYO STAND STRL (DRAPES) ×3 IMPLANT
COVER PROBE W GEL 5X96 (DRAPES) ×3 IMPLANT
DECANTER SPIKE VIAL GLASS SM (MISCELLANEOUS) ×2 IMPLANT
DERMABOND ADVANCED (GAUZE/BANDAGES/DRESSINGS)
DERMABOND ADVANCED .7 DNX12 (GAUZE/BANDAGES/DRESSINGS) IMPLANT
DEVICE DUBIN W/COMP PLATE 8390 (MISCELLANEOUS) ×3 IMPLANT
DRAPE LAPAROSCOPIC ABDOMINAL (DRAPES) ×3 IMPLANT
DRAPE UTILITY XL STRL (DRAPES) ×3 IMPLANT
DRSG PAD ABDOMINAL 8X10 ST (GAUZE/BANDAGES/DRESSINGS) ×3 IMPLANT
ELECT BLADE 4.0 EZ CLEAN MEGAD (MISCELLANEOUS) ×3
ELECT REM PT RETURN 9FT ADLT (ELECTROSURGICAL) ×3
ELECTRODE BLDE 4.0 EZ CLN MEGD (MISCELLANEOUS) IMPLANT
ELECTRODE REM PT RTRN 9FT ADLT (ELECTROSURGICAL) ×1 IMPLANT
GAUZE SPONGE 4X4 12PLY STRL (GAUZE/BANDAGES/DRESSINGS) ×3 IMPLANT
GAUZE SPONGE 4X4 12PLY STRL LF (GAUZE/BANDAGES/DRESSINGS) ×2 IMPLANT
GLOVE SURG SS PI 7.0 STRL IVOR (GLOVE) ×6 IMPLANT
GOWN STRL REUS W/ TWL LRG LVL3 (GOWN DISPOSABLE) ×1 IMPLANT
GOWN STRL REUS W/ TWL XL LVL3 (GOWN DISPOSABLE) ×1 IMPLANT
GOWN STRL REUS W/TWL LRG LVL3 (GOWN DISPOSABLE) ×3
GOWN STRL REUS W/TWL XL LVL3 (GOWN DISPOSABLE) ×3
HEMOSTAT SNOW SURGICEL 2X4 (HEMOSTASIS) ×2 IMPLANT
KIT MARKER MARGIN INK (KITS) ×3 IMPLANT
NDL HYPO 25X1 1.5 SAFETY (NEEDLE) ×2 IMPLANT
NDL SAFETY ECLIPSE 18X1.5 (NEEDLE) ×1 IMPLANT
NEEDLE HYPO 18GX1.5 SHARP (NEEDLE) ×3
NEEDLE HYPO 25X1 1.5 SAFETY (NEEDLE) ×6 IMPLANT
NS IRRIG 1000ML POUR BTL (IV SOLUTION) ×3 IMPLANT
PACK BASIN DAY SURGERY FS (CUSTOM PROCEDURE TRAY) ×3 IMPLANT
PAD ALCOHOL SWAB (MISCELLANEOUS) ×3 IMPLANT
PENCIL BUTTON HOLSTER BLD 10FT (ELECTRODE) ×3 IMPLANT
SHEET MEDIUM DRAPE 40X70 STRL (DRAPES) ×3 IMPLANT
SLEEVE SCD COMPRESS KNEE MED (MISCELLANEOUS) ×3 IMPLANT
SPONGE LAP 4X18 RFD (DISPOSABLE) ×3 IMPLANT
SUT MNCRL AB 4-0 PS2 18 (SUTURE) ×6 IMPLANT
SUT SILK 2 0 SH (SUTURE) ×3 IMPLANT
SUT VIC AB 2-0 CT1 27 (SUTURE)
SUT VIC AB 2-0 CT1 TAPERPNT 27 (SUTURE) IMPLANT
SUT VIC AB 3-0 SH 27 (SUTURE)
SUT VIC AB 3-0 SH 27X BRD (SUTURE) IMPLANT
SUT VICRYL 3-0 CR8 SH (SUTURE) ×3 IMPLANT
SYR 10ML LL (SYRINGE) ×6 IMPLANT
TOWEL GREEN STERILE FF (TOWEL DISPOSABLE) ×6 IMPLANT
TOWEL OR NON WOVEN STRL DISP B (DISPOSABLE) ×3 IMPLANT
TRAY FAXITRON CT DISP (TRAY / TRAY PROCEDURE) ×2 IMPLANT
TUBE CONNECTING 20'X1/4 (TUBING) ×1
TUBE CONNECTING 20X1/4 (TUBING) ×2 IMPLANT
YANKAUER SUCT BULB TIP NO VENT (SUCTIONS) ×3 IMPLANT

## 2018-06-24 NOTE — Progress Notes (Signed)
Assisted Dr. Marcie Bal with left, ultrasound guided, pectoralis block. Side rails up, monitors on throughout procedure. See vital signs in flow sheet. Tolerated Procedure well.

## 2018-06-24 NOTE — Anesthesia Procedure Notes (Signed)
Anesthesia Regional Block: Pectoralis block   Pre-Anesthetic Checklist: ,, timeout performed, Correct Patient, Correct Site, Correct Laterality, Correct Procedure, Correct Position, site marked, Risks and benefits discussed,  Surgical consent,  Pre-op evaluation,  At surgeon's request and post-op pain management  Laterality: Left  Prep: chloraprep       Needles:  Injection technique: Single-shot  Needle Type: Echogenic Needle     Needle Length: 9cm  Needle Gauge: 21     Additional Needles:   Narrative:  Start time: 06/24/2018 1:10 PM End time: 06/24/2018 1:21 PM Injection made incrementally with aspirations every 5 mL.  Performed by: Personally  Anesthesiologist: Albertha Ghee, MD  Additional Notes: Pt tolerated the procedure well.

## 2018-06-24 NOTE — Transfer of Care (Signed)
Immediate Anesthesia Transfer of Care Note  Patient: Breanna [name]  Procedure(s) Performed: LEFT BREAST LUMPECTOMY WITH  BRACKETED RADIOACTIVE SEED AND LEFT AXILLARY DEEP SENTINEL LYMPH NODE BIOPSY AND BLUE DYE INJECTION (Left Breast)  Patient Location: PACU  Anesthesia Type:GA combined with regional for post-op pain  Level of Consciousness: awake, alert , oriented, drowsy and patient cooperative  Airway & Oxygen Therapy: Patient Spontanous Breathing and Patient connected to face mask oxygen  Post-op Assessment: Report given to RN and Post -op Vital signs reviewed and stable  Post vital signs: Reviewed and stable  Last Vitals:  Vitals Value Taken Time  BP 143/96 06/24/2018  3:49 PM  Temp    Pulse 94 06/24/2018  3:52 PM  Resp 14 06/24/2018  3:52 PM  SpO2 100 % 06/24/2018  3:52 PM  Vitals shown include unvalidated device data.  Last Pain:  Vitals:   06/24/18 1242  TempSrc: Oral  PainSc: 0-No pain         Complications: No apparent anesthesia complications

## 2018-06-24 NOTE — Discharge Instructions (Signed)
[facility] °Office Phone Number [phone] ° °BREAST BIOPSY/ PARTIAL MASTECTOMY: POST OP INSTRUCTIONS ° °Always review your discharge instruction sheet given to you by the facility where your surgery was performed. ° °IF YOU HAVE DISABILITY OR FAMILY LEAVE FORMS, YOU MUST BRING THEM TO THE OFFICE FOR PROCESSING.  DO NOT GIVE THEM TO YOUR DOCTOR. ° °1. A prescription for pain medication may be given to you upon discharge.  Take your pain medication as prescribed, if needed.  If narcotic pain medicine is not needed, then you may take acetaminophen (Tylenol) or ibuprofen (Advil) as needed. °2. Take your usually prescribed medications unless otherwise directed °3. If you need a refill on your pain medication, please contact your pharmacy.  They will contact our office to request authorization.  Prescriptions will not be filled after 5pm or on week-ends. °4. You should eat very light the first 24 hours after surgery, such as soup, crackers, pudding, etc.  Resume your normal diet the day after surgery. °5. Most patients will experience some swelling and bruising in the breast.  Ice packs and a good support bra will help.  Swelling and bruising can take several days to resolve.  °6. It is common to experience some constipation if taking pain medication after surgery.  Increasing fluid intake and taking a stool softener will usually help or prevent this problem from occurring.  A mild laxative (Milk of Magnesia or Miralax) should be taken according to package directions if there are no bowel movements after 48 hours. °7. Unless discharge instructions indicate otherwise, you may remove your bandages 24-48 hours after surgery, and you may shower at that time.  You may have steri-strips (small skin tapes) in place directly over the incision.  These strips should be left on the skin for 7-10 days.  If your surgeon used skin glue on the incision, you may shower in 24 hours.  The glue will flake off over the  next 2-3 weeks.  Any sutures or staples will be removed at the office during your follow-up visit. °8. ACTIVITIES:  You may resume regular daily activities (gradually increasing) beginning the next day.  Wearing a good support bra or sports bra minimizes pain and swelling.  You may have sexual intercourse when it is comfortable. °a. You may drive when you no longer are taking prescription pain medication, you can comfortably wear a seatbelt, and you can safely maneuver your car and apply brakes. °b. RETURN TO WORK:  ______________________________________________________________________________________ °9. You should see your doctor in the office for a follow-up appointment approximately two weeks after your surgery.  Your doctor’s nurse will typically make your follow-up appointment when she calls you with your pathology report.  Expect your pathology report 2-3 business days after your surgery.  You may call to check if you do not hear from us after three days. °10. OTHER INSTRUCTIONS: _______________________________________________________________________________________________ _____________________________________________________________________________________________________________________________________ °_____________________________________________________________________________________________________________________________________ °_____________________________________________________________________________________________________________________________________ ° °WHEN TO CALL YOUR DOCTOR: °1. Fever over 101.0 °2. Nausea and/or vomiting. °3. Extreme swelling or bruising. °4. Continued bleeding from incision. °5. Increased pain, redness, or drainage from the incision. ° °The clinic staff is available to answer your questions during regular business hours.  Please don’t hesitate to call and ask to speak to one of the nurses for clinical concerns.  If you have a medical emergency, go to the nearest  emergency room or call 911.  A surgeon from [facility] is always on call at the hospital. ° °For further questions, please visit [email or web address]  ° ° ° ° ° ° °  Post Anesthesia Home Care Instructions ° °Activity: °Get plenty of rest for the remainder of the day. A responsible individual must stay with you for 24 hours following the procedure.  °For the next 24 hours, DO NOT: °-Drive a car °-Operate machinery °-Drink alcoholic beverages °-Take any medication unless instructed by your physician °-Make any legal decisions or sign important papers. ° °Meals: °Start with liquid foods such as gelatin or soup. Progress to regular foods as tolerated. Avoid greasy, spicy, heavy foods. If nausea and/or vomiting occur, drink only clear liquids until the nausea and/or vomiting subsides. Call your physician if vomiting continues. ° °Special Instructions/Symptoms: °Your throat may feel dry or sore from the anesthesia or the breathing tube placed in your throat during surgery. If this causes discomfort, gargle with warm salt water. The discomfort should disappear within 24 hours. ° °If you had a scopolamine patch placed behind your ear for the management of post- operative nausea and/or vomiting: ° °1. The medication in the patch is effective for 72 hours, after which it should be removed.  Wrap patch in a tissue and discard in the trash. Wash hands thoroughly with soap and water. °2. You may remove the patch earlier than 72 hours if you experience unpleasant side effects which may include dry mouth, dizziness or visual disturbances. °3. Avoid touching the patch. Wash your hands with soap and water after contact with the patch. °  ° ° ° ° ° ° ° °Regional Anesthesia Blocks ° °1. Numbness or the inability to move the "blocked" extremity may last from 3-48 hours after placement. The length of time depends on the medication injected and your individual response to the medication. If the numbness is not going  away after 48 hours, call your surgeon. ° °2. The extremity that is blocked will need to be protected until the numbness is gone and the  Strength has returned. Because you cannot feel it, you will need to take extra care to avoid injury. Because it may be weak, you may have difficulty moving it or using it. You may not know what position it is in without looking at it while the block is in effect. ° °3. For blocks in the legs and feet, returning to weight bearing and walking needs to be done carefully. You will need to wait until the numbness is entirely gone and the strength has returned. You should be able to move your leg and foot normally before you try and bear weight or walk. You will need someone to be with you when you first try to ensure you do not fall and possibly risk injury. ° °4. Bruising and tenderness at the needle site are common side effects and will resolve in a few days. ° °5. Persistent numbness or new problems with movement should be communicated to the surgeon or the [facility] ([phone])/ [facility] ([phone]). °

## 2018-06-24 NOTE — Op Note (Signed)
Patient Name:           Breanna [name]   Date of Surgery:        06/24/2018  Pre op Diagnosis:      Multifocal cancer left breast, retroareolar and upper inner quadrant  Post op Diagnosis:    Same  Procedure:                 Inject blue dye left breast                                      Left breast lumpectomy with bracketed radioactive seed localization                                      Left axillary deep sentinel lymph node biopsy  Surgeon:                     Edsel Petrin. 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 Primary care doctor is Sharilyn Sites at [facility]. . She has now seen Dr. Jana Hakim, Dr. Isidore Moos, and genetic counseling.       Noted some thickening in the left breast inferiorly and lower outer and went for imaging. Imaging of the lower outer quadrant was benign but they incidentally and unexpectedly found two small cancers in the left breast upper inner quadrant.  The first cancers at the 9:30 position of the left breast, 3 cm from the nipple, 1.1 cm, spiculated The second cancer was also the 9:30 position, 2 cm from the nipple.  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 Low proliferation index.     Genetics is negative      MRI shows 2 masses in the left upper inner quadrant with a total span of 3.3 cm Otherwise no mass or adenopathy on either side I have reviewed this with her Family history is negative for breast cancer ovarian cancer colon cancer or prostate cancer.co.      We all had a very long discussion about the extent of surgery. We talked about left lumpectomy with bracketed radioactive seed localization and sentinel lymph node biopsy and radiation therapy we talked about mastectomy with or without reconstruction. At the end of the discussion she is in favor of  breast conservation surgery and I think she is a good candidate for that       She'll be scheduled for left breast lumpectomy with bracketed seed localization, left axillary sentinel lymph node biopsy. Oncotype planned. I discussed the indications, details, techniques, and numerous risk of the surgery with her and her family. She agrees with this plan.  Operative Findings:       The 2 radioactive seeds were at least 4 cm apart and they were oriented retroareolar anterior and upper inner quadrant posteriorly.  Because of the anterior posterior orientation they were difficult to distinguish until we were well into the case.  Specimen mammogram looked good containing both seeds and both radioactive marker clips and we thought we had good margins.  I found to deep sentinel lymph  nodes.  Procedure in Detail:          Pectoral block was performed by the anesthesiologist.  Technetium radionuclide was injected by the nuclear medicine technician.  Patient was taken to the operating room and underwent general LMA anesthesia.  Intravenous antibiotics were given.  Surgical timeout was performed.  Following alcohol prep I injected 5 cc of dilute methylene blue into the left breast subareolar area and massaged the breast for a couple of minutes.  The entire left chest wall and axilla were then prepped and draped in a sterile fashion.  0.25% Marcaine with epinephrine was used as a local infiltration anesthetic.      I spent some time trying to map out the radioactive seeds.  Ultimately I made a circumareolar incision at the medial and superior areolar margin of the left breast.  Dissection and lumpectomy was performed using the neoprobe and electrocautery.  The specimen was removed and marked with silk sutures and a 6 color ink kit.  The 3D specimen mammogram looked good containing both seeds and both original biopsy clips.  The specimen was sent to the pathology lab where the seeds were retrieved.  This wound was  copiously irrigated.  Hemostasis excellent.  5 metal marker clips were placed in the walls of the lumpectomy cavity.  The breast tissues were carefully approximated in multiple layers with 3-0 Vicryl sutures and the skin closed with a running subcuticular 4-0 Monocryl and Dermabond.      We then made a transverse incision in the left axilla at the hairline and dissected down through the clavipectoral fascia.  With the neoprobe on the technetium setting we found 2 sentinel lymph nodes and sent those to the lab.  I found no other sentinel nodes.  Hemostasis was excellent and achieved with electrocautery, metal clips.  The wound was irrigated.  The clavipectoral fascia was closed with 3-0 Vicryl sutures and the skin closed with a running subcuticular 4-0 Monocryl and Dermabond.  Clean bandages and a breast binder were placed.  The patient tolerated the procedure well was taken to PACU in stable condition.  EBL 30 cc.  Counts correct.  Complications none.   Addendum: I logged onto the [employer] and reviewed her prescription medication history     Ura Yingling M. Dalbert Batman, M.D., FACS General and Minimally Invasive Surgery Breast and Colorectal Surgery  06/24/2018 3:43 PM

## 2018-06-24 NOTE — Interval H&P Note (Signed)
History and Physical Interval Note:  06/24/2018 1:18 PM  Breanna [name]  has presented today for surgery, with the diagnosis of LFT BREAST CANCER UPPER INNER QUADRANT  The various methods of treatment have been discussed with the patient and family. After consideration of risks, benefits and other options for treatment, the patient has consented to  Procedure(s): LEFT BREAST LUMPECTOMY WITH  BRACKETED RADIOACTIVE SEED AND LEFT AXILLARY DEEP SENTINEL LYMPH NODE BIOPSY AND BLUE DYE INJECTION (Left) as a surgical intervention .  The patient's history has been reviewed, patient examined, no change in status, stable for surgery.  I have reviewed the patient's chart and labs.  Questions were answered to the patient's satisfaction.     Adin Hector

## 2018-06-24 NOTE — Anesthesia Preprocedure Evaluation (Addendum)
Anesthesia Evaluation  Patient identified by MRN, date of birth, ID band Patient awake    Reviewed: Allergy & Precautions, NPO status , Patient's Chart, lab work & pertinent test results  History of Anesthesia Complications Negative for: history of anesthetic complications  Airway Mallampati: III  TM Distance: >3 FB Neck ROM: Full    Dental  (+) Dental Advisory Given   Pulmonary neg pulmonary ROS,    breath sounds clear to auscultation       Cardiovascular negative cardio ROS   Rhythm:Regular     Neuro/Psych  Headaches, PSYCHIATRIC DISORDERS Anxiety    GI/Hepatic GERD  ,  Endo/Other  Morbid obesity  Renal/GU      Musculoskeletal  (+) Arthritis ,   Abdominal   Peds  Hematology   Anesthesia Other Findings   Reproductive/Obstetrics Breast CA                            Anesthesia Physical Anesthesia Plan  ASA: III  Anesthesia Plan: General   Post-op Pain Management:    Induction: Intravenous  PONV Risk Score and Plan: 3 and Dexamethasone, Ondansetron, Midazolam and Treatment may vary due to age or medical condition  Airway Management Planned: LMA  Additional Equipment: None  Intra-op Plan:   Post-operative Plan: Extubation in OR  Informed Consent: I have reviewed the patients History and Physical, chart, labs and discussed the procedure including the risks, benefits and alternatives for the proposed anesthesia with the patient or authorized representative who has indicated his/her understanding and acceptance.     Plan Discussed with: CRNA, Anesthesiologist and Surgeon  Anesthesia Plan Comments:        Anesthesia Quick Evaluation

## 2018-06-24 NOTE — Anesthesia Procedure Notes (Signed)
Procedure Name: LMA Insertion Date/Time: 06/24/2018 2:01 PM Performed by: Raenette Rover, CRNA Pre-anesthesia Checklist: Patient identified, Emergency Drugs available, Suction available and Patient being monitored Patient Re-evaluated:Patient Re-evaluated prior to induction Oxygen Delivery Method: Circle system utilized Preoxygenation: Pre-oxygenation with 100% oxygen Induction Type: IV induction LMA: LMA inserted LMA Size: 4.0 Number of attempts: 1 Placement Confirmation: positive ETCO2,  CO2 detector and breath sounds checked- equal and bilateral Tube secured with: Tape Dental Injury: Teeth and Oropharynx as per pre-operative assessment

## 2018-06-25 ENCOUNTER — Encounter (HOSPITAL_BASED_OUTPATIENT_CLINIC_OR_DEPARTMENT_OTHER): Payer: Self-pay | Admitting: General Surgery

## 2018-06-26 NOTE — Anesthesia Postprocedure Evaluation (Signed)
Anesthesia Post Note  Patient: Aniyah Nobis Metallo  Procedure(s) Performed: LEFT BREAST LUMPECTOMY WITH  BRACKETED RADIOACTIVE SEED AND LEFT AXILLARY DEEP SENTINEL LYMPH NODE BIOPSY AND BLUE DYE INJECTION (Left Breast)     Patient location during evaluation: PACU Anesthesia Type: General and Regional Level of consciousness: awake and alert Pain management: pain level controlled Vital Signs Assessment: post-procedure vital signs reviewed and stable Respiratory status: spontaneous breathing, nonlabored ventilation, respiratory function stable and patient connected to nasal cannula oxygen Cardiovascular status: blood pressure returned to baseline and stable Postop Assessment: no apparent nausea or vomiting Anesthetic complications: no    Last Vitals:  Vitals:   06/24/18 1645 06/24/18 1710  BP: (!) 145/91 (!) 143/94  Pulse: 96 95  Resp: 13 20  Temp:  36.7 C  SpO2: 99% 100%    Last Pain:  Vitals:   06/24/18 1242  TempSrc: Oral                 Cassian Torelli

## 2018-06-28 ENCOUNTER — Telehealth: Payer: Self-pay | Admitting: *Deleted

## 2018-06-28 NOTE — Telephone Encounter (Signed)
Ordered mammaprint per Dr. Magrinat.  Faxed requisition to pathology and [facility] 

## 2018-07-05 ENCOUNTER — Ambulatory Visit: Payer: 59 | Admitting: Oncology

## 2018-07-11 ENCOUNTER — Telehealth: Payer: Self-pay | Admitting: *Deleted

## 2018-07-11 NOTE — Telephone Encounter (Signed)
Received mammaprint results of low risk.  Left message for patient to return my call.  Referral placed for Dr. Isidore Moos.

## 2018-07-12 NOTE — Progress Notes (Signed)
Location of Breast Cancer: Left Breast  Histology per Pathology Report:  05/07/18 Diagnosis 1. Breast, left, needle core biopsy, 9:30 3 cm from the nipple - INVASIVE MAMMARY CARCINOMA, GRADE II. SEE NOTE. - MAMMARY CARCINOMA IN SITU.  1. Immunostain for E-cadherin shows relatively faint, cytoplasmic and somewhat membranous staining, suggestive of a ductal phenotype.  Receptor Status: ER(80%), PR(80%), Her2-neu (NEG) Ki- (10%)  06/24/18 Diagnosis 1. Breast, lumpectomy, left - INVASIVE DUCTAL CARCINOMA, GRADE II/III, SPANNING 4.1 CM. - DUCTAL CARCINOMA IN SITU, LOW GRADE. - LOBULAR NEOPLASIA (LOBULAR CARCINOMA IN SITU). - THE SURGICAL RESECTION MARGINS ARE NEGATIVE FOR DUCTAL CARCINOMA. - SEE ONCOLOGY TABLE BELOW. 2. Breast, excision, left additional lateral margin - BENIGN BREAST PARENCHYMA. - THERE IS NO EVIDENCE OF MALIGNANCY. - SEE COMMENT. 3. 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 4. Lymph node, sentinel, biopsy, left axillary - MICROMETASTATIC CARCINOMA IN 1 OF 1 LYMPH NODE (1 MIC/1).   Did patient present with symptoms or was this found on screening mammography?: She presented to her PCP on 04/18/18 and reported palpable left breast mass. She was then sent for a mammogram.   Past/Anticipated interventions by surgeon, if any: 06/24/18 Procedure:                 Inject blue dye left breast                                      Left breast lumpectomy with bracketed radioactive seed localization                                      Left axillary deep sentinel lymph node biopsy Surgeon:                     Edsel Petrin. Dalbert Batman, M.D., [facility]   Past/Anticipated interventions by medical oncology, if any:  06/3018 Dr. Jana Hakim  MammaPrint read as low risk, with an average 10-year risk of recurrence with no treatment of 10%, the 5-year disease-free survival being 97.8% with hormone treatment  alone  adjuvant radiation pending   antiestrogens to follow: Consider goserelin/anastrozole  Lymphedema issues, if any:  She denies. She reports good arm mobility  Pain issues, if any: She reports some mild pain to her arms and shoulders.   SAFETY ISSUES:  Prior radiation? No  Pacemaker/ICD? No  Possible current pregnancy? 06/24/18 pregnancy test before surgery was negative. She denies current pregnancy. She is using condoms at this time.   Is the patient on methotrexate? No  Current Complaints / other details:  Uterine implant removal 05/29/18.    BP 108/69 (BP Location: Right Arm, Patient Position: Sitting)   Pulse 81   Temp 97.8 F (36.6 C) (Oral)   Resp 20   Ht '5\' 5"'$  (1.651 m)   Wt 261 lb 6.4 oz (118.6 kg)   SpO2 100%   BMI 43.50 kg/m    Wt Readings from Last 3 Encounters:  07/24/18 261 lb 6.4 oz (118.6 kg)  07/15/18 258 lb 4.8 oz (117.2 kg)  06/24/18 257 lb 8 oz (116.8 kg)

## 2018-07-13 NOTE — Progress Notes (Signed)
[facility]  [phone] [phone]     ID: Breanna [name] DOB: [birth year]  MR#: [id number]  BMW#:[id number]  Patient Care Team: Ginger Organ as PCP - General (Physician Assistant) Danie Binder, MD as Consulting Physician (Gastroenterology) , Virgie Dad, MD as Consulting Physician (Oncology) Eppie Gibson, MD as Attending Physician (Radiation Oncology) Fanny Skates, MD as Consulting Physician (General Surgery) Estill Dooms, NP as Nurse Practitioner (Obstetrics and Gynecology) OTHER MD:  CHIEF COMPLAINT: Estrogen receptor positive multifocal breast cancer  CURRENT TREATMENT: Adjuvant radiation pending   HISTORY OF CURRENT ILLNESS: From the original intake note:  Breanna [name] noted a palpable abnormality in the 4 o'clock location of the LEFT breast sometime in September 2019.  She brought it up to medical attention and underwent bilateral diagnostic mammography with tomography and left breast ultrasonography at [facility] on 04/23/2018 showing: a Suspicious mass in the 9:30 o'clock location of the LEFT breast 2 centimeters from the nipple; by ultrasound this was an irregular hypoechoic mass with spiculated margins and posterior acoustic shadowing which measures 0.5 x 0.7 x 1.1 centimeters. A second similar lesion is also suspected in the same portion of the breast but has not been completely evaluated. Recommend targeted ultrasound of the MEDIAL aspect of the LEFT breast at the time of patient's biopsy and possible second biopsy. LEFT axilla is negative for adenopathy. The areas of clinical concern in the LOWER OUTER QUADRANT of the LEFT breast in the [place] 10 centimeters from the nipple are negative by mammogram and ultrasound.  Accordingly on 05/07/2018 the patient proceeded to biopsy of the left breast areas in question. The pathology from this procedure showed ([id number]): 1. Breast, left,  needle core biopsy, at 9:30 o'clock 3 cm from the nipple: invasive mammary carcinoma, grade II. Mammary carcinoma in situ. 2. Breast, left, needle core biopsy, 9:30 o'clock 2 cm from the nipple with invasive ductal carcinoma, grade I. Mammary carcinoma in situ.  E-cadherin stains are faint in the larger tumor but still positive, strong in the second tumor, and thus these are read as ductal.  Prognostic indicators significant for:  1. Estrogen receptor, 80% positive, moderate staining intensity and progesterone receptor, 80% positive, strong staining intensity. Proliferation marker Ki67 at 10%. HER2 negative by immunohistochemistry, 1+.   2. Estrogen Receptor: 90%, positive, strong staining intensity and progesterone Receptor: 90%, positive, strong staining intensity. Proliferation Marker Ki67: 10%. HER2 negative by immunohistochemistry, 1+.    The patient's subsequent history is as detailed below.  INTERVAL HISTORY: Breanna [name] returns today for follow-up of her estrogen receptor positive multifocal breast cancer. She is accompanied by two family members.  Since her last visit here on 05/22/2018, she underwent [facility] genetics testing on 05/22/2018 showing no pathogenic mutations. The Multi-Cancer Panel offered by [facility] includes sequencing and/or deletion duplication testing of the following 91 genes: AIP, ALK, APC, ATM, AXIN2, BAP1, BARD1, BLM, BMPR1A, BRCA1, BRCA2, BRIP1, BUB1B, CASR, CDC73, CDH1, CDK4, CDKN1B, CDKN1C, CDKN2A, CEBPA, CEP57, CHEK2, CTNNA1, DICER1, DIS3L2, EGFR, ENG, EPCAM, FH, FLCN, GALNT12, GATA2, GPC3, GREM1, HOXB13, HRAS, KIT, MAX, MEN1, MET, MITF, MLH1, MLH3, MSH2, MSH3, MSH6, MUTYH, NBN, NF1, NF2, NTHL1, PALB2, PDGFRA, PHOX2B, PMS2, POLD1, POLE, POT1, PRKAR1A, PTCH1, PTEN, RAD50, RAD51C, RAD51D, RB1, RECQL4, RET, RNF43, RPS20, RUNX1, SDHA, SDHAF2, SDHB, SDHC, SDHD, SMAD4, SMARCA4, SMARCB1, SMARCE1, STK11, SUFU, TERC, TERT, TMEM127, TP53, TSC1, TSC2, VHL, WRN, WT1.  She also  underwent a left breast lumpectomy on 06/24/2018. The pathology from  this procedure ([id number]) showed:  (1) left breast lumpectomy - Invasive ductal carcinoma, grade II/III*, spanning 4.1 cm.  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  We have sent a request to pathology to help [place] clarify this discrepancy   REVIEW OF SYSTEMS: Breanna [name] slept in the recliner for the first four days after the lumpectomy due to discomfort sleeping in the bed; she can now sleep comfortably. She is worried about a possible infection around her incision at the left areola. She takes Advil or Tylenol for general discomfort. She has not gone to physical therapy. The patient denies unusual headaches, visual changes, nausea, vomiting, or dizziness. There has been no unusual cough, phlegm production, or pleurisy. This been no change in bowel or bladder habits. The patient denies unexplained fatigue or unexplained weight loss, rash, or fever. A detailed review of systems was otherwise noncontributory.    PAST MEDICAL HISTORY: Past Medical History:  Diagnosis Date  . Anxiety   . Arthritis    knees  . Asthma   . Breast cancer ([place]) 05/2018   left   . Cluster headaches   . Cough 06/11/2018  . Dental crown present   . Family history of bladder cancer   . Family history of kidney cancer   . Family history of lung cancer   . Family history of stomach cancer   . GERD (gastroesophageal reflux disease)   . Heart palpitations     occasional - no cardiologist  . History of asthma    as a child - prn inhaler  . Obesity   . Pseudotumor cerebri syndrome   . Seasonal allergies   . Stuffy nose 06/11/2018    PAST SURGICAL HISTORY: Past Surgical History:  Procedure Laterality Date  . BIOPSY  11/08/2015   Procedure: BIOPSY;  Surgeon: Danie Binder, MD;  [facility];  Service: Endoscopy;;  Gastric bx and Gastric polyp bx  . BREAST LUMPECTOMY WITH RADIOACTIVE SEED AND SENTINEL LYMPH NODE BIOPSY Left 06/24/2018   Procedure: LEFT BREAST LUMPECTOMY WITH  BRACKETED RADIOACTIVE SEED AND LEFT AXILLARY DEEP SENTINEL LYMPH NODE BIOPSY AND BLUE DYE INJECTION;  Surgeon: Fanny Skates, MD;  Location: [facility];  Service: General;  Laterality: Left;  . CESAREAN SECTION  11/03/2003  . CESAREAN SECTION  05/15/2006  . CHOLECYSTECTOMY    . COLONOSCOPY N/A 11/08/2015   Procedure: COLONOSCOPY;  Surgeon: Danie Binder, MD;  [facility];  Service: Endoscopy;  Laterality: N/A;  1000  . ESOPHAGOGASTRODUODENOSCOPY N/A 11/08/2015   Procedure: ESOPHAGOGASTRODUODENOSCOPY (EGD);  Surgeon: Danie Binder, MD;  [facility];  Service: Endoscopy;  Laterality: N/A;  . HEMORRHOID BANDING N/A 11/08/2015   Procedure: HEMORRHOID BANDING;  Surgeon: Danie Binder, MD;  [facility];  Service: Endoscopy;  Laterality: N/A;  . SAVORY DILATION N/A 11/08/2015   Procedure: SAVORY DILATION;  Surgeon: Danie Binder, MD;  [facility];  Service: Endoscopy;  Laterality: N/A;  . WISDOM TOOTH EXTRACTION  FAMILY HISTORY Family History  Problem Relation Age of Onset  . COPD Mother   . Pneumonia Mother   . Hyperlipidemia Father   . Kidney cancer Father 22       "simple renal cell carcinoma"  . Heart disease Maternal Aunt   . Hypertension Maternal Grandmother   . Diabetes Maternal Grandmother   . Stroke Paternal Grandmother   . Hypertension Paternal Grandmother   . Diabetes Paternal  Grandmother   . Stomach cancer Paternal Grandmother 26       had a portion of stomah removed  . Diabetes Paternal Grandfather   . Heart disease Paternal Grandfather   . Hyperlipidemia Paternal Grandfather   . Hypertension Paternal Grandfather   . Bladder Cancer Maternal Uncle        hx smoking  . Lung cancer Other    As of November 2019 her father is 64 years old. Patients' mother died from pneumonia at age 75. The patient has 0 brothers and 0 sisters. Patient denies any family story of ovarian or breast cancer. Her paternal grandmother had gastric cancer in 2000. Her paternal aunt had lung cancer without a hx of smoking. Her maternal uncle has had bladder cancer.    GYNECOLOGIC HISTORY:  No LMP recorded. Patient has had an implant. Menarche: 37 years old Age at first live birth: 37 years old [place] P2 LMP: No Contraceptive: Implanon in place.  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 Her husband Letitia Libra works for the [employer] as an Mining engineer. Her two children are 46.28 years old and 56 years old.  The patient attends full [employer] in [place].   ADVANCED DIRECTIVES:  n/a   HEALTH MAINTENANCE: Social History   Tobacco Use  . Smoking status: Never Smoker  . Smokeless tobacco: Never Used  Substance Use Topics  . Alcohol use: No  . Drug use: No     Colonoscopy: 2017  PAP: 2017  Bone density: No   Allergies  Allergen Reactions  . Adhesive [Tape] Other (See Comments)    BLISTERS  . Latex Hives and Itching  . Topamax [Topiramate] Other (See Comments)    ALTERED MENTAL STATUS  . Celecoxib Palpitations  . Codeine Itching and Rash  . Penicillins Itching and Rash       . Sulfa Antibiotics Itching and Rash    Current Outpatient Medications  Medication Sig Dispense Refill  . acetaZOLAMIDE (DIAMOX) 500 MG capsule Take 500 mg by mouth daily.    [place] Kitchen albuterol (PROVENTIL  HFA;VENTOLIN HFA) 108 (90 Base) MCG/ACT inhaler Inhale into the lungs every 6 (six) hours as needed for wheezing or shortness of breath.    . ALPRAZolam (XANAX) 0.5 MG tablet Take 1 tablet by mouth daily as needed for anxiety.     . busPIRone (BUSPAR) 7.5 MG tablet Take 7.5 mg by mouth daily.     . cetirizine (ZYRTEC) 10 MG tablet Take 10 mg by mouth daily.    . Cyanocobalamin (VITAMIN B 12) 500 MCG TABS Take by mouth.    Mar[place] Kitchen HYDROcodone-acetaminophen (NORCO) 5-325 MG tablet Take 1-2 tablets by mouth every 6 (six) hours as needed for moderate pain or severe pain. 30 tablet 0  . pantoprazole (PROTONIX) 40 MG tablet Take 40 mg by mouth daily.    Marland Kitchen zonisamide (ZONEGRAN) 25 MG capsule Take 50 mg by mouth daily.  No current facility-administered medications for this visit.     OBJECTIVE: Orbitally obese white woman who appears stated age   52:   07/15/18 1440  BP: 121/88  Pulse: 87  Resp: 18  Temp: 98.2 F (36.8 C)  SpO2: 100%     Body mass index is 42.98 kg/m.   Wt Readings from Last 3 Encounters:  07/15/18 258 lb 4.8 oz (117.2 kg)  06/24/18 257 lb 8 oz (116.8 kg)  05/29/18 260 lb (117.9 kg)      ECOG FS:1 - Symptomatic but completely ambulatory  Sclerae unicteric, EOMs intact Oropharynx clear and moist No cervical or supraclavicular adenopathy Lungs no rales or rhonchi Heart regular rate and rhythm Abd soft, nontender, positive bowel sounds MSK no focal spinal tenderness, no upper extremity lymphedema Neuro: nonfocal, well oriented, appropriate affect Breasts: The right breast is benign.  The left breast is status post recent lumpectomy.  The incisions are healing very nicely, with minimal eschar formation.  There is no significant swelling or erythema and no dehiscence.  The cosmetic result is good.  Both axillae are benign.  LAB RESULTS:  CMP     Component Value Date/Time   NA 138 06/17/2018 0849   K 3.5 06/17/2018 0849   CL 109 06/17/2018 0849   CO2 23  06/17/2018 0849   GLUCOSE 116 (H) 06/17/2018 0849   BUN 11 06/17/2018 0849   CREATININE 1.20 (H) 06/17/2018 0849   CREATININE 1.05 (H) 05/22/2018 1158   CREATININE 1.08 08/22/2013 1039   CALCIUM 8.6 (L) 06/17/2018 0849   PROT 7.1 06/17/2018 0849   ALBUMIN 3.8 06/17/2018 0849   AST 25 06/17/2018 0849   AST 19 05/22/2018 1158   ALT 33 06/17/2018 0849   ALT 25 05/22/2018 1158   ALKPHOS 41 06/17/2018 0849   BILITOT 0.8 06/17/2018 0849   BILITOT 0.6 05/22/2018 1158   GFRNONAA 57 (L) 06/17/2018 0849   GFRNONAA >60 05/22/2018 1158   GFRAA >60 06/17/2018 0849   GFRAA >60 05/22/2018 1158    No results found for: TOTALPROTELP, ALBUMINELP, A1GS, A2GS, BETS, BETA2SER, GAMS, MSPIKE, SPEI  No results found for: KPAFRELGTCHN, LAMBDASER, KAPLAMBRATIO  Lab Results  Component Value Date   WBC 10.1 06/17/2018   NEUTROABS 6.7 06/17/2018   HGB 14.3 06/17/2018   HCT 42.9 06/17/2018   MCV 97.3 06/17/2018   PLT 282 06/17/2018    _0 @  No results found for: LABCA2  No components found for: [id number]  No results for input(s): INR in the last 168 hours.  No results found for: LABCA2  No results found for: AOZ308  No results found for: MVH846  No results found for: NGE952  No results found for: CA2729  No components found for: HGQUANT  No results found for: CEA1 / No results found for: CEA1   No results found for: AFPTUMOR  No results found for: CHROMOGRNA  No results found for: PSA1  No visits with results within 3 Day(s) from this visit.  Latest known visit with results is:  Admission on 06/24/2018, Discharged on 06/24/2018  Component Date Value Ref Range Status  . Preg Test, Ur 06/24/2018 NEGATIVE  NEGATIVE Final   Comment:        THE SENSITIVITY OF THIS METHODOLOGY IS >24 mIU/mL     (this displays the last labs from the last 3 days)  No results found for: TOTALPROTELP, ALBUMINELP, A1GS, A2GS, BETS, BETA2SER, GAMS, MSPIKE, SPEI (this displays SPEP  labs)  No results found for: KPAFRELGTCHN,  LAMBDASER, KAPLAMBRATIO (kappa/lambda light chains)  No results found for: HGBA, HGBA2QUANT, HGBFQUANT, HGBSQUAN (Hemoglobinopathy evaluation)   No results found for: LDH  No results found for: IRON, TIBC, IRONPCTSAT (Iron and TIBC)  No results found for: FERRITIN  Urinalysis No results found for: COLORURINE, APPEARANCEUR, LABSPEC, PHURINE, GLUCOSEU, HGBUR, BILIRUBINUR, KETONESUR, PROTEINUR, UROBILINOGEN, NITRITE, LEUKOCYTESUR   STUDIES:  Nm Sentinel Node Inj-no Rpt (breast)  Result Date: 06/24/2018 Sulfur colloid was injected by the nuclear medicine technologist for melanoma sentinel node.   Mm Breast Surgical Specimen  Result Date: 06/24/2018 CLINICAL DATA:  Specimen radiograph status post left breast lumpectomy. EXAM: SPECIMEN RADIOGRAPH OF THE LEFT BREAST COMPARISON:  Previous exam(s). FINDINGS: Status post excision of the left breast. The 2 radioactive seeds and 2 biopsy marker clips are present, completely intact. These findings were communicated with the OR at 3:02 p.m. IMPRESSION: Specimen radiograph of the left breast. Electronically Signed   By: Ammie Ferrier M.D.   On: 06/24/2018 15:03   Mm Lt Radioactive Seed Loc Mammo Guide  Result Date: 06/20/2018 CLINICAL DATA:  Patient for preoperative localization EXAM: MAMMOGRAPHIC GUIDED RADIOACTIVE SEED LOCALIZATION OF THE LEFT BREAST COMPARISON:  Previous exam(s). FINDINGS: Patient presents for radioactive seed localization prior to left breast lumpectomy. I met with the patient and we discussed the procedure of seed localization including benefits and alternatives. We discussed the high likelihood of a successful procedure. We discussed the risks of the procedure including infection, bleeding, tissue injury and further surgery. We discussed the low dose of radioactivity involved in the procedure. Informed, written consent was given. The usual time-out protocol was performed immediately  prior to the procedure. Site 1: (Ribbon shaped marking clip). Using mammographic guidance, sterile technique, 1% lidocaine and an I-125 radioactive seed, ribbon shaped marking clip was localized using a medial approach. The follow-up mammogram images confirm the seed in the expected location and were marked for Dr. Dalbert Batman. Follow-up survey of the patient confirms presence of the radioactive seed. Order number of I-125 seed:  [id number]. Total activity:  1.324 millicuries reference Date: 06/07/2018 Site 2: (Wing shaped marking clip). Using mammographic guidance, sterile technique, 1% lidocaine and an I-125 radioactive seed, wing shaped marking clip was localized using a medial approach. The follow-up mammogram images confirm the seed in the expected location and were marked for Dr. Dalbert Batman. Follow-up survey of the patient confirms presence of the radioactive seed. Order number of I-125 seed:  [id number]. Total activity:  6.644 millicuries reference Date: 06/06/2018 The patient tolerated the procedure well and was released from the [facility]. She was given instructions regarding seed removal. IMPRESSION: Radioactive seed localization left breast, 2 sites as above. No apparent complications. Electronically Signed   By: Lovey Newcomer M.D.   On: 06/20/2018 15:14   Mm Lt Rad Seed Ea Add Lesion Loc Mammo  Result Date: 06/20/2018 CLINICAL DATA:  Patient for preoperative localization EXAM: MAMMOGRAPHIC GUIDED RADIOACTIVE SEED LOCALIZATION OF THE LEFT BREAST COMPARISON:  Previous exam(s). FINDINGS: Patient presents for radioactive seed localization prior to left breast lumpectomy. I met with the patient and we discussed the procedure of seed localization including benefits and alternatives. We discussed the high likelihood of a successful procedure. We discussed the risks of the procedure including infection, bleeding, tissue injury and further surgery. We discussed the low dose of radioactivity involved in the procedure.  Informed, written consent was given. The usual time-out protocol was performed immediately prior to the procedure. Site 1: (Ribbon shaped marking clip). Using mammographic guidance, sterile technique,  1% lidocaine and an I-125 radioactive seed, ribbon shaped marking clip was localized using a medial approach. The follow-up mammogram images confirm the seed in the expected location and were marked for Dr. Dalbert Batman. Follow-up survey of the patient confirms presence of the radioactive seed. Order number of I-125 seed:  [id number]. Total activity:  1.655 millicuries reference Date: 06/07/2018 Site 2: (Wing shaped marking clip). Using mammographic guidance, sterile technique, 1% lidocaine and an I-125 radioactive seed, wing shaped marking clip was localized using a medial approach. The follow-up mammogram images confirm the seed in the expected location and were marked for Dr. Dalbert Batman. Follow-up survey of the patient confirms presence of the radioactive seed. Order number of I-125 seed:  [id number]. Total activity:  6.754 millicuries reference Date: 06/06/2018 The patient tolerated the procedure well and was released from the [facility]. She was given instructions regarding seed removal. IMPRESSION: Radioactive seed localization left breast, 2 sites as above. No apparent complications. Electronically Signed   By: Lovey Newcomer M.D.   On: 06/20/2018 15:14    ELIGIBLE FOR AVAILABLE RESEARCH PROTOCOL: no  ASSESSMENT: 37 [facility].o. [facility] woman, status post left breast biopsy x2 on 05/07/2018, showing (a) left breast upper inner quadrant 2 cm from the nipple: a clinical T1b N0, stage IA invasive ductal carcinoma, grade 1, estrogen and progesterone receptor strongly positive, HER-2 not amplified, with an MIB-1 of 10%; strongly E-cadherin positive (b) left breast upper inner quadrant 3 cm from the nipple, a clinical T1c N0, stage IA invasive ductal carcinoma, grade 2, estrogen receptor positive with moderate  staining intensity, progesterone receptor positive with strong staining intensity, with an MIB-1 of 10%, and no HER-2 amplification; faint but positive E-cadherin stain  (1) genetics testing 05/28/2018 through the Multi-Cancer Panel offered by [facility], no deleterious mutations in AIP, ALK, APC, ATM, AXIN2, BAP1, BARD1, BLM, BMPR1A, BRCA1, BRCA2, BRIP1, BUB1B, CASR, CDC73, CDH1, CDK4, CDKN1B, CDKN1C, CDKN2A, CEBPA, CEP57, CHEK2, CTNNA1, DICER1, DIS3L2, EGFR, ENG, EPCAM, FH, FLCN, GALNT12, GATA2, GPC3, GREM1, HOXB13, HRAS, KIT, MAX, MEN1, MET, MITF, MLH1, MLH3, MSH2, MSH3, MSH6, MUTYH, NBN, NF1, NF2, NTHL1, PALB2, PDGFRA, PHOX2B, PMS2, POLD1, POLE, POT1, PRKAR1A, PTCH1, PTEN, RAD50, RAD51C, RAD51D, RB1, RECQL4, RET, RNF43, RPS20, RUNX1, SDHA, SDHAF2, SDHB, SDHC, SDHD, SMAD4, SMARCA4, SMARCB1, SMARCE1, STK11, SUFU, TERC, TERT, TMEM127, TP53, TSC1, TSC2, VHL, WRN, WT1  (2) status post left lumpectomy and sentinel lymph node sampling 06/24/2018 for a pT2 pN1, stage IIA invasive ductal carcinoma, grade 1, with negative margins  (a) a total of 2 sentinel lymph nodes were removed  (3) MammaPrint read as low risk, with an average 10-year risk of recurrence with no treatment of 10%, the 5-year disease-free survival being 97.8% with hormone treatment alone  (4) adjuvant radiation pending  (5) antiestrogens to follow: Consider goserelin/anastrozole   PLAN: Breanna [name] did well with her surgery.  She still has a small eschar over the peri-areolar scar.  Hopefully this will complete healing before she starts her radiation likely within 2 weeks.  We reviewed her pathology in detail.  There is a slight discrepancy in the grade and I have alerted pathology regarding that.  I do think her tumor is stage IIa and we discussed staging issues in detail.  We went into the [facility] report as well.  She understands by taking hormone therapy alone she has an approximately 2% chance of having cancer recurrence outside within  the next 5 years.  The [place] of course means that she has a 98% chance of  that not being the case.  Chemotherapy is not going to make that significantly better and therefore chemotherapy is not recommended in her situation.  We discussed axillary dissection and she meets all criteria for Z 11 so I do not think she needs axillary dissection.  She will have the appropriate axillary radiation included with her breast radiation.  Finally we discussed antiestrogens.  I think she should consider goserelin and anastrozole.  She is willing to give it a try.  I am going to go ahead and write the order so we have that in place by the time she returns to see me after she completes radiation at which time we will make a definitive decision  She knows to call for any other issues that may develop before then. , Virgie Dad, MD  07/15/18 5:10 PM Medical Oncology and Hematology [facility] [address] [place], [place] [address] Tel. [phone]    Fax. [phone]    I, Jacqualyn Posey am acting as a Education administrator for Chauncey Cruel, MD.   I, Lurline Del MD, have reviewed the above documentation for accuracy and completeness, and I agree with the above.

## 2018-07-15 ENCOUNTER — Inpatient Hospital Stay: Payer: 59 | Attending: Oncology | Admitting: Oncology

## 2018-07-15 VITALS — BP 121/88 | HR 87 | Temp 98.2°F | Resp 18 | Ht 65.0 in | Wt 258.3 lb

## 2018-07-15 DIAGNOSIS — F419 Anxiety disorder, unspecified: Secondary | ICD-10-CM | POA: Diagnosis not present

## 2018-07-15 DIAGNOSIS — C50812 Malignant neoplasm of overlapping sites of left female breast: Secondary | ICD-10-CM

## 2018-07-15 DIAGNOSIS — Z801 Family history of malignant neoplasm of trachea, bronchus and lung: Secondary | ICD-10-CM | POA: Diagnosis not present

## 2018-07-15 DIAGNOSIS — G932 Benign intracranial hypertension: Secondary | ICD-10-CM | POA: Diagnosis not present

## 2018-07-15 DIAGNOSIS — Z79811 Long term (current) use of aromatase inhibitors: Secondary | ICD-10-CM | POA: Diagnosis not present

## 2018-07-15 DIAGNOSIS — Z79899 Other long term (current) drug therapy: Secondary | ICD-10-CM | POA: Insufficient documentation

## 2018-07-15 DIAGNOSIS — J449 Chronic obstructive pulmonary disease, unspecified: Secondary | ICD-10-CM

## 2018-07-15 DIAGNOSIS — C50212 Malignant neoplasm of upper-inner quadrant of left female breast: Secondary | ICD-10-CM

## 2018-07-15 DIAGNOSIS — E669 Obesity, unspecified: Secondary | ICD-10-CM | POA: Insufficient documentation

## 2018-07-15 DIAGNOSIS — Z8052 Family history of malignant neoplasm of bladder: Secondary | ICD-10-CM | POA: Insufficient documentation

## 2018-07-15 DIAGNOSIS — J45909 Unspecified asthma, uncomplicated: Secondary | ICD-10-CM | POA: Insufficient documentation

## 2018-07-15 DIAGNOSIS — E785 Hyperlipidemia, unspecified: Secondary | ICD-10-CM | POA: Diagnosis not present

## 2018-07-15 DIAGNOSIS — Z1379 Encounter for other screening for genetic and chromosomal anomalies: Secondary | ICD-10-CM

## 2018-07-15 DIAGNOSIS — Z170 Estrogen receptor positive status [ER+]: Secondary | ICD-10-CM | POA: Diagnosis not present

## 2018-07-15 DIAGNOSIS — K219 Gastro-esophageal reflux disease without esophagitis: Secondary | ICD-10-CM | POA: Diagnosis not present

## 2018-07-15 DIAGNOSIS — M129 Arthropathy, unspecified: Secondary | ICD-10-CM | POA: Insufficient documentation

## 2018-07-15 DIAGNOSIS — Z800 Family history of malignant neoplasm of digestive organs: Secondary | ICD-10-CM | POA: Insufficient documentation

## 2018-07-16 ENCOUNTER — Encounter: Payer: Self-pay | Admitting: Oncology

## 2018-07-17 DIAGNOSIS — Z923 Personal history of irradiation: Secondary | ICD-10-CM

## 2018-07-17 HISTORY — DX: Personal history of irradiation: Z92.3

## 2018-07-19 ENCOUNTER — Other Ambulatory Visit: Payer: Self-pay | Admitting: Oncology

## 2018-07-19 ENCOUNTER — Encounter (HOSPITAL_COMMUNITY): Payer: Self-pay | Admitting: Oncology

## 2018-07-24 ENCOUNTER — Ambulatory Visit: Payer: 59 | Admitting: Rehabilitation

## 2018-07-24 ENCOUNTER — Ambulatory Visit
Admission: RE | Admit: 2018-07-24 | Discharge: 2018-07-24 | Disposition: A | Discharge status: Home / Self Care | Payer: 59 | Source: Ambulatory Visit | Attending: Radiation Oncology | Admitting: Radiation Oncology

## 2018-07-24 ENCOUNTER — Other Ambulatory Visit: Payer: Self-pay | Admitting: Radiation Oncology

## 2018-07-24 ENCOUNTER — Encounter: Payer: Self-pay | Admitting: Radiation Oncology

## 2018-07-24 ENCOUNTER — Other Ambulatory Visit: Payer: Self-pay

## 2018-07-24 VITALS — BP 108/69 | HR 81 | Temp 97.8°F | Resp 20 | Ht 65.0 in | Wt 261.4 lb

## 2018-07-24 DIAGNOSIS — Z170 Estrogen receptor positive status [ER+]: Secondary | ICD-10-CM | POA: Insufficient documentation

## 2018-07-24 DIAGNOSIS — R5383 Other fatigue: Secondary | ICD-10-CM | POA: Diagnosis not present

## 2018-07-24 DIAGNOSIS — Z79899 Other long term (current) drug therapy: Secondary | ICD-10-CM | POA: Insufficient documentation

## 2018-07-24 DIAGNOSIS — R293 Abnormal posture: Secondary | ICD-10-CM | POA: Diagnosis present

## 2018-07-24 DIAGNOSIS — Z510 Encounter for antineoplastic radiation therapy: Secondary | ICD-10-CM

## 2018-07-24 DIAGNOSIS — C50812 Malignant neoplasm of overlapping sites of left female breast: Secondary | ICD-10-CM

## 2018-07-24 DIAGNOSIS — C773 Secondary and unspecified malignant neoplasm of axilla and upper limb lymph nodes: Secondary | ICD-10-CM | POA: Diagnosis not present

## 2018-07-24 DIAGNOSIS — I890 Lymphedema, not elsewhere classified: Secondary | ICD-10-CM | POA: Diagnosis not present

## 2018-07-24 LAB — PREGNANCY, URINE: Preg Test, Ur: NEGATIVE

## 2018-07-24 NOTE — Progress Notes (Signed)
Radiation Oncology         [phone]) [phone] ________________________________  Name: Breanna [name] MRN: [id number]  Date: 07/24/2018  DOB: 09-26-1980  SIMULATION AND TREATMENT PLANNING NOTE   Special treatment procedure   Outpatient  DIAGNOSIS:     ICD-10-CM   1. Malignant neoplasm of overlapping sites of left breast in female, estrogen receptor positive ([place]) C50.812    Z17.0     NARRATIVE:  The patient was brought to the [facility].  Identity was confirmed.  All relevant records and images related to the planned course of therapy were reviewed.  The patient freely provided informed written consent to proceed with treatment after reviewing the details related to the planned course of therapy. The consent form was witnessed and verified by the simulation staff.    Then, the patient was set-up in a stable reproducible supine position for radiation therapy with her ipsilateral arm over her head, and her upper body secured in a custom-made Vac-lok device.  CT images were obtained.  Surface markings were placed.  The CT images were loaded into the planning software.    Special treatment procedure:  Special treatment procedure was performed today due to the extra time and effort required by myself to plan and prepare this patient for deep inspiration breath hold technique.  I have determined cardiac sparing to be of benefit to this patient to prevent long term cardiac damage due to radiation of the heart.  Bellows were placed on the patient's abdomen. To facilitate cardiac sparing, the patient was coached by the radiation therapists on breath hold techniques and breathing practice was performed. Practice waveforms were obtained. The patient was then scanned while maintaining breath hold in the treatment position.  This image was then transferred over to the imaging specialist. The imaging specialist then created a fusion of the free breathing and breath hold scans using the chest  wall as the stable structure. I personally reviewed the fusion in axial, coronal and sagittal image planes.  Excellent cardiac sparing was obtained.  I felt the patient is an appropriate candidate for breath hold and the patient will be treated as such.  The image fusion was then reviewed with the patient to reinforce the necessity of reproducible breath hold.  TREATMENT PLANNING NOTE: Treatment planning then occurred.  The radiation prescription was entered and confirmed.     A total of 5 medically necessary complex treatment devices were fabricated and supervised by me: 4 fields with MLCs for custom blocks to protect heart, and lungs;  and, a Vac-lok. MORE COMPLEX DEVICES MAY BE MADE IN DOSIMETRY FOR FIELD IN FIELD BEAMS FOR DOSE HOMOGENEITY.  I have requested : 3D Simulation which is medically necessary to give adequate dose to at risk tissues while sparing lungs and heart.  I have requested a DVH of the following structures: lungs, heart, IM nodes, lumpectomy cavity, esophagus, cord50.    The patient will receive 50 Gy in 25 fractions to the left breast and regional nodes (Axilla, IM Nodes, SCV nodes) with 4 fields.  This will be followed by a boost.  Optical Surface Tracking Plan:  Since intensity modulated radiotherapy (IMRT) and 3D conformal radiation treatment methods are predicated on accurate and precise positioning for treatment, intrafraction motion monitoring is medically necessary to ensure accurate and safe treatment delivery. The ability to quantify intrafraction motion without excessive ionizing radiation dose can only be performed with optical surface tracking. Accordingly, surface imaging offers the opportunity to obtain 3D  measurements of patient position throughout IMRT and 3D treatments without excessive radiation exposure. I am ordering optical surface tracking for this patient's upcoming course of radiotherapy.  ________________________________   Reference:  Ursula Alert, J, et al. Surface imaging-based analysis of intrafraction motion for breast radiotherapy patients.Journal of [place], n. 6, nov. 2014. ISSN [id number].  Available at: <http://www.jacmp.org/index.php/jacmp/article/view/4957>.    -----------------------------------  Eppie Gibson, MD

## 2018-07-24 NOTE — Progress Notes (Signed)
Radiation Oncology         [phone] ________________________________  Name: Breanna [name] MRN: [id number]  Date: 07/24/2018  DOB: 1980/11/06  Follow-Up Visit Note  Outpatient  CC: Erasmo Leventhal, MD  Diagnosis:      ICD-10-CM   1. Malignant neoplasm of overlapping sites of left breast in female, estrogen receptor positive ([place]) C50.812 Ambulatory referral to Social Work   Z17.0     Cancer Staging Malignant neoplasm of overlapping sites of left breast in female, estrogen receptor positive ([place]) Staging form: Breast, AJCC 8th Edition - Clinical: Stage IA (cT1c, cN0, cM0, G2, ER+, PR+, HER2-) - Signed by Eppie Gibson, MD on 05/27/2018 - Pathologic: Stage IA (pT2, pN1a, cM0, G1, ER+, PR+, HER2-) - Signed by Eppie Gibson, MD on 07/24/2018   CHIEF COMPLAINT: Here to discuss management of left breast cancer  Narrative:  The patient returns today for follow-up.     Since consultation date, she underwent the following imaging (dates and results as follows): 05/29/18 MR Breasts:  Two masses in the upper inner quadrant of the left breast are biopsy-proven malignancies. In total, the enhancing masses span 3.3cm anterior-posterior diameter. There is some suspicious linear nonmass enhancement between the two known malignancies, and specifically directly posterior to the lateral margin of the most anterior mass. No MRI evidence of malignancy in the right breast. Negative for lymphadenopathy.   On 06/24/18 she underwent left lumpectomy and SLN bx revealing a 4.1cm Grade II tumor (IDC) with negative margins and of the 2 sentinel nodes removed, 1 had metastatic carcinoma with ECE, the other micrometastatic carcinoma.    06/3018 Dr. Jana Hakim reportedMammaPrint read as low risk, and she will not receive chemotherapy.  Lymphedema issues, if any:She denies. She reports good arm mobility  Pain issues, if any:She reports some mild pain to her arms and shoulders.    SAFETY ISSUES:  Prior radiation?No  Pacemaker/ICD?No  Possiblecurrent pregnancy?06/24/18 pregnancy test before surgery was negative. She denies current pregnancy. She is using condoms at this time.   Is the patient on methotrexate?No  Current Complaints / other details: Uterine implant removal 05/29/18.          ALLERGIES:  is allergic to adhesive [tape]; latex; topamax [topiramate]; celecoxib; codeine; penicillins; and sulfa antibiotics.  Meds: Current Outpatient Medications  Medication Sig Dispense Refill  . acetaZOLAMIDE (DIAMOX) 500 MG capsule Take 500 mg by mouth daily.    Marland Kitchen albuterol (PROVENTIL HFA;VENTOLIN HFA) 108 (90 Base) MCG/ACT inhaler Inhale into the lungs every 6 (six) hours as needed for wheezing or shortness of breath.    . ALPRAZolam (XANAX) 0.5 MG tablet Take 1 tablet by mouth daily as needed for anxiety.     . busPIRone (BUSPAR) 7.5 MG tablet Take 7.5 mg by mouth daily.     . cetirizine (ZYRTEC) 10 MG tablet Take 10 mg by mouth daily.    . Cyanocobalamin (VITAMIN B 12) 500 MCG TABS Take by mouth.    . pantoprazole (PROTONIX) 40 MG tablet Take 40 mg by mouth daily.    Marland Kitchen zonisamide (ZONEGRAN) 25 MG capsule Take 50 mg by mouth daily.     Marland Kitchen HYDROcodone-acetaminophen (NORCO) 5-325 MG tablet Take 1-2 tablets by mouth every 6 (six) hours as needed for moderate pain or severe pain. (Patient not taking: Reported on 07/24/2018) 30 tablet 0   No current facility-administered medications for this encounter.     Physical Findings:  height is 5' 5" (1.651 m) and  weight is 261 lb 6.4 oz (118.6 kg). Her oral temperature is 97.8 F (36.6 C). Her blood pressure is 108/69 and her pulse is 81. Her respiration is 20 and oxygen saturation is 100%. .     General: Alert and oriented, in no acute distress Neurologic: No obvious focalities. Speech is fluent.  Psychiatric: Judgment and insight are intact. Affect is appropriate. Breast exam reveals well healed lumpectomy and  axillary scars, left breast  Lab Findings: Lab Results  Component Value Date   WBC 10.1 06/17/2018   HGB 14.3 06/17/2018   HCT 42.9 06/17/2018   MCV 97.3 06/17/2018   PLT 282 06/17/2018      Radiographic Findings: As above  Impression/Plan: We discussed adjuvant radiotherapy today.  I recommend radiotherapy to the left breast and regional nodes in order to reduce risk of locoregional recurrence by 2/3.  Given her nodal positivity, young age, and medial tumor, I anticipate treating SCV, PAB, and IM nodes with a 4 field plan using breathhold technique to spare heart. The risks, benefits and side effects of this treatment were discussed in detail.  She understands that radiotherapy is associated with skin irritation and fatigue in the acute setting. Late effects can include lymphedema, cosmetic changes and rare injury to internal organs.   She is enthusiastic about proceeding with treatment. A consent form has been signed and placed in her chart.  A total of 5 medically necessary complex treatment devices will be fabricated and supervised by me: 4 fields with MLCs for custom blocks to protect heart, and lungs;  and, a Vac-lok. MORE COMPLEX DEVICES MAY BE MADE IN DOSIMETRY FOR FIELD IN FIELD BEAMS FOR DOSE HOMOGENEITY.  I have requested : 3D Simulation which is medically necessary to give adequate dose to at risk tissues while sparing lungs and heart.  I have requested a DVH of the following structures: lungs, heart, esophagus, cord, IM nodes, left lumpectomy cavity.    The patient will receive 50 Gy in 25 fractions to the breast and nodes with 4 fields.  This will be followed by a boost.  I spent at least 25 minutes face to face with the patient and more than 50% of that time was spent in counseling and/or coordination of care. _____________________________________   Eppie Gibson, MD

## 2018-07-25 ENCOUNTER — Encounter: Payer: Self-pay | Admitting: General Practice

## 2018-07-25 NOTE — Progress Notes (Signed)
[facility] Psychosocial Distress Screening Clinical Social Work  Clinical Social Work was referred by distress screening protocol.  The patient scored a 5 on the Psychosocial Distress Thermometer which indicates moderate distress. Clinical Social Worker contacted patient by phone to assess for distress and other psychosocial needs. Currently struggling w laryngitis.  Continuing to work as Therapist, sports at [employer].  Concerned that she will not be able to continue to work throughout radiation treatment.  Financial strain.  Can assist w gas/food card from [facility] - patient will stop by after radiation treatment on 1/17.  ONCBCN DISTRESS SCREENING 07/24/2018  Screening Type Initial Screening  Distress experienced in past week (1-10) 5  Practical problem type Work/school  Emotional problem type Nervousness/Anxiety;Adjusting to illness  Information Concerns Type   Physical Problem type Pain;Sleep/insomnia;Skin dry/itchy    Clinical Social Worker follow up needed: No.  If yes, follow up plan:  Beverely Pace, [place], LCSW Clinical Social Worker Phone:  [phone]

## 2018-07-26 ENCOUNTER — Encounter: Payer: Self-pay | Admitting: Radiation Oncology

## 2018-07-26 ENCOUNTER — Telehealth: Payer: Self-pay | Admitting: *Deleted

## 2018-07-26 DIAGNOSIS — R293 Abnormal posture: Secondary | ICD-10-CM | POA: Diagnosis not present

## 2018-07-26 DIAGNOSIS — C50812 Malignant neoplasm of overlapping sites of left female breast: Secondary | ICD-10-CM | POA: Diagnosis not present

## 2018-07-26 NOTE — Telephone Encounter (Signed)
CALLED SOCIAL WORK TO INFORM OF REFERRAL THAT DR. SQUIRE PLACED ON 07-25-18, LVM

## 2018-07-27 DIAGNOSIS — J019 Acute sinusitis, unspecified: Secondary | ICD-10-CM | POA: Diagnosis not present

## 2018-07-27 DIAGNOSIS — Z8709 Personal history of other diseases of the respiratory system: Secondary | ICD-10-CM | POA: Diagnosis not present

## 2018-07-27 DIAGNOSIS — R05 Cough: Secondary | ICD-10-CM | POA: Diagnosis not present

## 2018-07-31 ENCOUNTER — Ambulatory Visit
Admission: RE | Admit: 2018-07-31 | Discharge: 2018-07-31 | Disposition: A | Discharge status: Home / Self Care | Payer: 59 | Source: Ambulatory Visit | Attending: Radiation Oncology | Admitting: Radiation Oncology

## 2018-07-31 DIAGNOSIS — R293 Abnormal posture: Secondary | ICD-10-CM | POA: Diagnosis not present

## 2018-07-31 DIAGNOSIS — C50812 Malignant neoplasm of overlapping sites of left female breast: Secondary | ICD-10-CM | POA: Diagnosis not present

## 2018-08-01 ENCOUNTER — Ambulatory Visit
Admission: RE | Admit: 2018-08-01 | Discharge: 2018-08-01 | Disposition: A | Discharge status: Home / Self Care | Payer: 59 | Source: Ambulatory Visit | Attending: Radiation Oncology | Admitting: Radiation Oncology

## 2018-08-01 DIAGNOSIS — C50812 Malignant neoplasm of overlapping sites of left female breast: Secondary | ICD-10-CM | POA: Diagnosis not present

## 2018-08-01 DIAGNOSIS — R293 Abnormal posture: Secondary | ICD-10-CM | POA: Diagnosis not present

## 2018-08-02 ENCOUNTER — Encounter: Payer: Self-pay | Admitting: General Practice

## 2018-08-02 ENCOUNTER — Ambulatory Visit
Admission: RE | Admit: 2018-08-02 | Discharge: 2018-08-02 | Disposition: A | Discharge status: Home / Self Care | Payer: 59 | Source: Ambulatory Visit | Attending: Radiation Oncology | Admitting: Radiation Oncology

## 2018-08-02 DIAGNOSIS — R293 Abnormal posture: Secondary | ICD-10-CM | POA: Diagnosis not present

## 2018-08-02 DIAGNOSIS — C50812 Malignant neoplasm of overlapping sites of left female breast: Secondary | ICD-10-CM | POA: Diagnosis not present

## 2018-08-02 NOTE — Progress Notes (Signed)
[facility] CSW Progress Notes  Follow up w patient re financial distress.  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  Hopes to continue to work, but experiencing financial stress due to unpaid household bills as well as medical bills.  Applied for [employer] help - no cards available today.  Will come back Monday after funds have been replenished.  Given applications for [employer], [facility] and [employer] in [place].  Encouraged to apply.  Rad Print production planner notified and asked to reach out to patient re [employer].    Edwyna Shell, LCSW Clinical Social Worker Phone:  [phone]

## 2018-08-05 ENCOUNTER — Ambulatory Visit
Admission: RE | Admit: 2018-08-05 | Discharge: 2018-08-05 | Disposition: A | Discharge status: Home / Self Care | Payer: 59 | Source: Ambulatory Visit | Attending: Radiation Oncology | Admitting: Radiation Oncology

## 2018-08-05 DIAGNOSIS — Z170 Estrogen receptor positive status [ER+]: Principal | ICD-10-CM

## 2018-08-05 DIAGNOSIS — R293 Abnormal posture: Secondary | ICD-10-CM | POA: Diagnosis not present

## 2018-08-05 DIAGNOSIS — C50812 Malignant neoplasm of overlapping sites of left female breast: Secondary | ICD-10-CM

## 2018-08-05 MED ORDER — RADIAPLEXRX EX GEL
Freq: Once | CUTANEOUS | Status: AC
  Administered 2018-08-05: 15:00:00 via TOPICAL

## 2018-08-05 MED ORDER — ALRA NON-METALLIC DEODORANT (RAD-ONC)
1.0000 "application " | Freq: Once | TOPICAL | Status: AC
  Administered 2018-08-05: 1 via TOPICAL

## 2018-08-05 NOTE — Progress Notes (Signed)
Pt here for patient teaching.  Pt given Radiation and You booklet, skin care instructions, Alra deodorant and Radiaplex gel.  Reviewed areas of pertinence such as fatigue, skin changes, breast tenderness and breast swelling . Pt able to give teach back of to pat skin and use unscented/gentle soap,apply Radiaplex bid, avoid applying anything to skin within 4 hours of treatment, avoid wearing an under wire bra and to use an electric razor if they must shave. Pt verbalized understanding, of information given and will contact nursing with any questions or concerns.     [email or web address]/[email or web address]

## 2018-08-06 ENCOUNTER — Ambulatory Visit: Payer: 59 | Attending: Oncology | Admitting: Physical Therapy

## 2018-08-06 ENCOUNTER — Encounter: Payer: Self-pay | Admitting: Physical Therapy

## 2018-08-06 ENCOUNTER — Ambulatory Visit
Admission: RE | Admit: 2018-08-06 | Discharge: 2018-08-06 | Disposition: A | Discharge status: Home / Self Care | Payer: 59 | Source: Ambulatory Visit | Attending: Radiation Oncology | Admitting: Radiation Oncology

## 2018-08-06 ENCOUNTER — Other Ambulatory Visit: Payer: Self-pay

## 2018-08-06 DIAGNOSIS — C50812 Malignant neoplasm of overlapping sites of left female breast: Secondary | ICD-10-CM | POA: Insufficient documentation

## 2018-08-06 DIAGNOSIS — Z170 Estrogen receptor positive status [ER+]: Secondary | ICD-10-CM | POA: Insufficient documentation

## 2018-08-06 DIAGNOSIS — R293 Abnormal posture: Secondary | ICD-10-CM | POA: Diagnosis not present

## 2018-08-06 NOTE — Therapy (Signed)
[place], [place], [address] Phone: [phone]   Fax:  [phone]  Physical Therapy Evaluation  Patient Details  Name: Breanna [name] MRN: [id number] Date of Birth: [birth year] Referring Provider (PT): Magrinat   Encounter Date: 08/06/2018  PT End of Session - 08/06/18 1416    Visit Number  1    Number of Visits  1    Authorization Type  [employer] no auth    PT Start Time  2353    PT Stop Time  1415    PT Time Calculation (min)  26 min    Activity Tolerance  Patient tolerated treatment well    Behavior During Therapy  [facility] for tasks assessed/performed       Past Medical History:  Diagnosis Date  . Anxiety   . Arthritis    knees  . Asthma   . Breast cancer ([place]) 05/2018   left   . Cluster headaches   . Cough 06/11/2018  . Dental crown present   . Family history of bladder cancer   . Family history of kidney cancer   . Family history of lung cancer   . Family history of stomach cancer   . GERD (gastroesophageal reflux disease)   . Heart palpitations    occasional - no cardiologist  . History of asthma    as a child - prn inhaler  . Obesity   . Pseudotumor cerebri syndrome   . Seasonal allergies   . Stuffy nose 06/11/2018    Past Surgical History:  Procedure Laterality Date  . BIOPSY  11/08/2015   Procedure: BIOPSY;  Surgeon: Danie Binder, MD;  [facility];  Service: Endoscopy;;  Gastric bx and Gastric polyp bx  . BREAST LUMPECTOMY WITH RADIOACTIVE SEED AND SENTINEL LYMPH NODE BIOPSY Left 06/24/2018   Procedure: LEFT BREAST LUMPECTOMY WITH  BRACKETED RADIOACTIVE SEED AND LEFT AXILLARY DEEP SENTINEL LYMPH NODE BIOPSY AND BLUE DYE INJECTION;  Surgeon: Fanny Skates, MD;  Location: [facility];  Service: General;  Laterality: Left;  . CESAREAN SECTION  11/03/2003  . CESAREAN SECTION  05/15/2006  . CHOLECYSTECTOMY    . COLONOSCOPY N/A 11/08/2015   Procedure:  COLONOSCOPY;  Surgeon: Danie Binder, MD;  [facility];  Service: Endoscopy;  Laterality: N/A;  1000  . ESOPHAGOGASTRODUODENOSCOPY N/A 11/08/2015   Procedure: ESOPHAGOGASTRODUODENOSCOPY (EGD);  Surgeon: Danie Binder, MD;  [facility];  Service: Endoscopy;  Laterality: N/A;  . HEMORRHOID BANDING N/A 11/08/2015   Procedure: HEMORRHOID BANDING;  Surgeon: Danie Binder, MD;  [facility];  Service: Endoscopy;  Laterality: N/A;  . SAVORY DILATION N/A 11/08/2015   Procedure: SAVORY DILATION;  Surgeon: Danie Binder, MD;  [facility];  Service: Endoscopy;  Laterality: N/A;  . WISDOM TOOTH EXTRACTION      There were no vitals filed for this visit.   Subjective Assessment - 08/06/18 1353    Subjective  I am not sure why I am here. I think they wanted my arms measured to see if I need a sleeve. I think it is a waste of time really.    Pertinent History  L breast cancer, ER/PR positive, left lumpectomy 06/24/18, pt has started radiation (5/33 completed)    Patient Stated Goals  pt does not feel like she needs therapy    Currently in Pain?  No/denies    Pain Score  0-No pain  [facility] PT Assessment - 08/06/18 0001      Assessment   Medical Diagnosis  left breast cancer    Referring Provider (PT)  Magrinat    Onset Date/Surgical Date  06/24/18    Hand Dominance  Right    Prior Therapy  none      Precautions   Precautions  Other (comment)    Precaution Comments  at risk for lymphedema      Restrictions   Weight Bearing Restrictions  No      Balance Screen   Has the patient fallen in the past 6 months  No    Has the patient had a decrease in activity level because of a fear of falling?   No    Is the patient reluctant to leave their home because of a fear of falling?   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[place], has to do patient care, pulling and lifting    Leisure  pt reports she does not exercise      Cognition   Overall Cognitive Status  Within Functional Limits for tasks assessed      ROM / Strength   AROM / PROM / Strength  AROM      AROM   AROM Assessment Site  Shoulder    Right/Left Shoulder  Right;Left    Right Shoulder Flexion  159 Degrees    Right Shoulder ABduction  162 Degrees    Right Shoulder Internal Rotation  45 Degrees    Right Shoulder External Rotation  90 Degrees    Left Shoulder Flexion  158 Degrees    Left Shoulder ABduction  175 Degrees    Left Shoulder Internal Rotation  60 Degrees    Left Shoulder External Rotation  90 Degrees        LYMPHEDEMA/ONCOLOGY QUESTIONNAIRE - 08/06/18 1401      Type   Cancer Type  left breast cancer      Surgeries   Lumpectomy Date  06/24/18    Sentinel Lymph Node Biopsy Date  06/24/18    Number Lymph Nodes Removed  2      Treatment   Active Chemotherapy Treatment  No    Past Chemotherapy Treatment  No    Active Radiation Treatment  Yes    Past Radiation Treatment  No    Current Hormone Treatment  No   will begin after radiation   Past Hormone Therapy  No      What other symptoms do you have   Are you Having Heaviness or Tightness  No    Are you having Pain  No    Are you having pitting edema  No    Is it Hard or Difficult finding clothes that fit  No    Do you have infections  No    Is there Decreased scar mobility  No      Lymphedema Assessments   Lymphedema Assessments  Upper extremities      Right Upper Extremity Lymphedema   15 cm Proximal to Olecranon Process  38.7 cm    Olecranon Process  27.7 cm    15 cm Proximal to Ulnar  Styloid Process  28.8 cm    Just Proximal to Ulnar Styloid Process  17.2 cm    Across Hand at [employer]  20.8 cm    At [place] of 2nd  Digit  6.6 cm      Left Upper Extremity Lymphedema   15 cm Proximal to Olecranon Process  40 cm    Olecranon Process  29 cm    15 cm Proximal to Ulnar Styloid Process  29.2 cm    Just Proximal to Ulnar Styloid Process  17.5 cm    Across Hand at [employer]  21.1 cm    At [place] of 2nd Digit  6.5 cm             Objective measurements completed on examination: See above findings.              PT Education - 08/06/18 1417    Education Details  ABC class flyer, ways of reducing lymphedema, post op stretches, using tennis ball to massage back to decrease tightness    Person(s) Educated  Patient    Methods  Explanation;Handout    Comprehension  Verbalized understanding           Breast Clinic Goals - 08/06/18 1422      Patient will be able to verbalize understanding of pertinent lymphedema risk reduction practices relevant to her diagnosis specifically related to skin care.   Status  Achieved      Patient will be able to return demonstrate and/or verbalize understanding of the post-op home exercise program related to regaining shoulder range of motion.   Status  Achieved      Patient will be able to verbalize understanding of the importance of attending the postoperative After Breast Cancer Class for further lymphedema risk reduction education and therapeutic exercise.   Status  Achieved            Plan - 08/06/18 1418    Clinical Impression Statement  Pt is undergoing treatment for left breast cancer and was referred to PT. She underwent a left lumpectomy on 06/24/18. 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 She does not demonstrate any sign of swelling. She does not feel a need for physical therapy at this time. Educated pt in lymphedema risk reduction practices, ABC class flyer and importance of stretching throughout radiation.    History and Personal Factors relevant to plan of care:  recent surgery,  pt's job requires lifting and pulling    Clinical Presentation  Evolving    Clinical Presentation due to:  pt is currently undergoing radiation    Clinical Decision Making  Moderate    Rehab Potential  Good    Clinical Impairments Affecting Rehab Potential  pt undergoing radiation    PT Frequency  One time visit    PT Treatment/Interventions  ADLs/Self Care Home Management;Patient/family education    PT Next Visit Plan  d/c this visit    Consulted and Agree with Plan of Care  Patient       Patient will benefit from skilled therapeutic intervention in order to improve the following deficits and impairments:  Decreased knowledge of precautions  Visit Diagnosis: Abnormal posture  Malignant neoplasm of overlapping sites of left breast in female, estrogen receptor positive ([place])     Problem List Patient Active Problem List   Diagnosis Date Noted  . Genetic testing 05/28/2018  . Malignant neoplasm of overlapping sites of  left breast in female, estrogen receptor positive ([place]) 05/22/2018  . Family history of kidney cancer   . Family history of bladder cancer   . Family history of stomach cancer   . Family history of lung cancer   . Esophageal reflux   . GERD (gastroesophageal reflux disease) 09/29/2015  . Rectal bleeding 09/29/2015  . Dysphagia 09/29/2015  . Insertion of Nexplanon 06/16/2015  . Pain of both breasts 05/21/2014  . Breast nodule 05/21/2014  . Anxiety 05/21/2014  . Obesity 08/22/2013  . Pseudotumor cerebri syndrome 08/22/2013    Allyson Sabal [facility] 08/06/2018, 2:23 PM  [place], [place], [address] Phone: [phone]   Fax:  [phone]  Name: Scott Fix Caddell MRN: [id number] Date of Birth: Nov 26, 1980  Manus Gunning, PT 08/06/18 2:23 PM

## 2018-08-07 ENCOUNTER — Ambulatory Visit
Admission: RE | Admit: 2018-08-07 | Discharge: 2018-08-07 | Disposition: A | Discharge status: Home / Self Care | Payer: 59 | Source: Ambulatory Visit | Attending: Radiation Oncology | Admitting: Radiation Oncology

## 2018-08-07 DIAGNOSIS — C50812 Malignant neoplasm of overlapping sites of left female breast: Secondary | ICD-10-CM | POA: Diagnosis not present

## 2018-08-07 DIAGNOSIS — R293 Abnormal posture: Secondary | ICD-10-CM | POA: Diagnosis not present

## 2018-08-08 ENCOUNTER — Ambulatory Visit
Admission: RE | Admit: 2018-08-08 | Discharge: 2018-08-08 | Disposition: A | Discharge status: Home / Self Care | Payer: 59 | Source: Ambulatory Visit | Attending: Radiation Oncology | Admitting: Radiation Oncology

## 2018-08-08 DIAGNOSIS — R293 Abnormal posture: Secondary | ICD-10-CM | POA: Diagnosis not present

## 2018-08-08 DIAGNOSIS — C50812 Malignant neoplasm of overlapping sites of left female breast: Secondary | ICD-10-CM | POA: Diagnosis not present

## 2018-08-09 ENCOUNTER — Ambulatory Visit
Admission: RE | Admit: 2018-08-09 | Discharge: 2018-08-09 | Disposition: A | Discharge status: Home / Self Care | Payer: 59 | Source: Ambulatory Visit | Attending: Radiation Oncology | Admitting: Radiation Oncology

## 2018-08-09 DIAGNOSIS — R293 Abnormal posture: Secondary | ICD-10-CM | POA: Diagnosis not present

## 2018-08-09 DIAGNOSIS — C50812 Malignant neoplasm of overlapping sites of left female breast: Secondary | ICD-10-CM | POA: Diagnosis not present

## 2018-08-12 ENCOUNTER — Ambulatory Visit
Admission: RE | Admit: 2018-08-12 | Discharge: 2018-08-12 | Disposition: A | Discharge status: Home / Self Care | Payer: 59 | Source: Ambulatory Visit | Attending: Radiation Oncology | Admitting: Radiation Oncology

## 2018-08-12 DIAGNOSIS — R293 Abnormal posture: Secondary | ICD-10-CM | POA: Diagnosis not present

## 2018-08-12 DIAGNOSIS — C50812 Malignant neoplasm of overlapping sites of left female breast: Secondary | ICD-10-CM | POA: Diagnosis not present

## 2018-08-13 ENCOUNTER — Ambulatory Visit
Admission: RE | Admit: 2018-08-13 | Discharge: 2018-08-13 | Disposition: A | Discharge status: Home / Self Care | Payer: 59 | Source: Ambulatory Visit | Attending: Radiation Oncology | Admitting: Radiation Oncology

## 2018-08-13 DIAGNOSIS — R293 Abnormal posture: Secondary | ICD-10-CM | POA: Diagnosis not present

## 2018-08-13 DIAGNOSIS — C50812 Malignant neoplasm of overlapping sites of left female breast: Secondary | ICD-10-CM | POA: Diagnosis not present

## 2018-08-14 ENCOUNTER — Ambulatory Visit: Payer: 59

## 2018-08-15 ENCOUNTER — Ambulatory Visit
Admission: RE | Admit: 2018-08-15 | Discharge: 2018-08-15 | Disposition: A | Discharge status: Home / Self Care | Payer: 59 | Source: Ambulatory Visit | Attending: Radiation Oncology | Admitting: Radiation Oncology

## 2018-08-15 DIAGNOSIS — C50812 Malignant neoplasm of overlapping sites of left female breast: Secondary | ICD-10-CM | POA: Diagnosis not present

## 2018-08-15 DIAGNOSIS — R293 Abnormal posture: Secondary | ICD-10-CM | POA: Diagnosis not present

## 2018-08-16 ENCOUNTER — Ambulatory Visit
Admission: RE | Admit: 2018-08-16 | Discharge: 2018-08-16 | Disposition: A | Discharge status: Home / Self Care | Payer: 59 | Source: Ambulatory Visit | Attending: Radiation Oncology | Admitting: Radiation Oncology

## 2018-08-16 DIAGNOSIS — C50812 Malignant neoplasm of overlapping sites of left female breast: Secondary | ICD-10-CM | POA: Diagnosis not present

## 2018-08-16 DIAGNOSIS — R293 Abnormal posture: Secondary | ICD-10-CM | POA: Diagnosis not present

## 2018-08-19 ENCOUNTER — Ambulatory Visit
Admission: RE | Admit: 2018-08-19 | Discharge: 2018-08-19 | Disposition: A | Discharge status: Home / Self Care | Payer: 59 | Source: Ambulatory Visit | Attending: Radiation Oncology | Admitting: Radiation Oncology

## 2018-08-19 DIAGNOSIS — C50812 Malignant neoplasm of overlapping sites of left female breast: Secondary | ICD-10-CM | POA: Diagnosis not present

## 2018-08-19 DIAGNOSIS — Z170 Estrogen receptor positive status [ER+]: Secondary | ICD-10-CM | POA: Insufficient documentation

## 2018-08-20 ENCOUNTER — Ambulatory Visit
Admission: RE | Admit: 2018-08-20 | Discharge: 2018-08-20 | Disposition: A | Discharge status: Home / Self Care | Payer: 59 | Source: Ambulatory Visit | Attending: Radiation Oncology | Admitting: Radiation Oncology

## 2018-08-20 DIAGNOSIS — C50812 Malignant neoplasm of overlapping sites of left female breast: Secondary | ICD-10-CM | POA: Diagnosis not present

## 2018-08-21 ENCOUNTER — Ambulatory Visit
Admission: RE | Admit: 2018-08-21 | Discharge: 2018-08-21 | Disposition: A | Discharge status: Home / Self Care | Payer: 59 | Source: Ambulatory Visit | Attending: Radiation Oncology | Admitting: Radiation Oncology

## 2018-08-21 DIAGNOSIS — C50812 Malignant neoplasm of overlapping sites of left female breast: Secondary | ICD-10-CM | POA: Diagnosis not present

## 2018-08-22 ENCOUNTER — Ambulatory Visit: Payer: 59

## 2018-08-23 ENCOUNTER — Ambulatory Visit
Admission: RE | Admit: 2018-08-23 | Discharge: 2018-08-23 | Disposition: A | Discharge status: Home / Self Care | Payer: 59 | Source: Ambulatory Visit | Attending: Radiation Oncology | Admitting: Radiation Oncology

## 2018-08-23 DIAGNOSIS — C50812 Malignant neoplasm of overlapping sites of left female breast: Secondary | ICD-10-CM | POA: Diagnosis not present

## 2018-08-26 ENCOUNTER — Ambulatory Visit
Admission: RE | Admit: 2018-08-26 | Discharge: 2018-08-26 | Disposition: A | Discharge status: Home / Self Care | Payer: 59 | Source: Ambulatory Visit | Attending: Radiation Oncology | Admitting: Radiation Oncology

## 2018-08-26 DIAGNOSIS — C50812 Malignant neoplasm of overlapping sites of left female breast: Secondary | ICD-10-CM | POA: Diagnosis not present

## 2018-08-26 DIAGNOSIS — Z170 Estrogen receptor positive status [ER+]: Principal | ICD-10-CM

## 2018-08-26 MED ORDER — RADIAPLEXRX EX GEL
Freq: Once | CUTANEOUS | Status: AC
  Administered 2018-08-26: 16:00:00 via TOPICAL

## 2018-08-27 ENCOUNTER — Ambulatory Visit
Admission: RE | Admit: 2018-08-27 | Discharge: 2018-08-27 | Disposition: A | Discharge status: Home / Self Care | Payer: 59 | Source: Ambulatory Visit | Attending: Radiation Oncology | Admitting: Radiation Oncology

## 2018-08-27 DIAGNOSIS — C50812 Malignant neoplasm of overlapping sites of left female breast: Secondary | ICD-10-CM | POA: Diagnosis not present

## 2018-08-28 ENCOUNTER — Ambulatory Visit
Admission: RE | Admit: 2018-08-28 | Discharge: 2018-08-28 | Disposition: A | Discharge status: Home / Self Care | Payer: 59 | Source: Ambulatory Visit | Attending: Radiation Oncology | Admitting: Radiation Oncology

## 2018-08-28 DIAGNOSIS — C50812 Malignant neoplasm of overlapping sites of left female breast: Secondary | ICD-10-CM | POA: Diagnosis not present

## 2018-08-29 ENCOUNTER — Ambulatory Visit
Admission: RE | Admit: 2018-08-29 | Discharge: 2018-08-29 | Disposition: A | Discharge status: Home / Self Care | Payer: 59 | Source: Ambulatory Visit | Attending: Radiation Oncology | Admitting: Radiation Oncology

## 2018-08-29 DIAGNOSIS — C50812 Malignant neoplasm of overlapping sites of left female breast: Secondary | ICD-10-CM | POA: Diagnosis not present

## 2018-08-30 ENCOUNTER — Ambulatory Visit
Admission: RE | Admit: 2018-08-30 | Discharge: 2018-08-30 | Disposition: A | Discharge status: Home / Self Care | Payer: 59 | Source: Ambulatory Visit | Attending: Radiation Oncology | Admitting: Radiation Oncology

## 2018-08-30 DIAGNOSIS — C50812 Malignant neoplasm of overlapping sites of left female breast: Secondary | ICD-10-CM | POA: Diagnosis not present

## 2018-09-02 ENCOUNTER — Ambulatory Visit: Payer: 59 | Admitting: Radiation Oncology

## 2018-09-02 ENCOUNTER — Ambulatory Visit
Admission: RE | Admit: 2018-09-02 | Discharge: 2018-09-02 | Disposition: A | Discharge status: Home / Self Care | Payer: 59 | Source: Ambulatory Visit | Attending: Radiation Oncology | Admitting: Radiation Oncology

## 2018-09-02 DIAGNOSIS — C50812 Malignant neoplasm of overlapping sites of left female breast: Secondary | ICD-10-CM

## 2018-09-02 DIAGNOSIS — Z170 Estrogen receptor positive status [ER+]: Principal | ICD-10-CM

## 2018-09-02 MED ORDER — SONAFINE EX EMUL
1.0000 "application " | Freq: Once | CUTANEOUS | Status: AC
  Administered 2018-09-02: 1 via TOPICAL

## 2018-09-03 ENCOUNTER — Ambulatory Visit
Admission: RE | Admit: 2018-09-03 | Discharge: 2018-09-03 | Disposition: A | Discharge status: Home / Self Care | Payer: 59 | Source: Ambulatory Visit | Attending: Radiation Oncology | Admitting: Radiation Oncology

## 2018-09-03 DIAGNOSIS — C50812 Malignant neoplasm of overlapping sites of left female breast: Secondary | ICD-10-CM | POA: Diagnosis not present

## 2018-09-04 ENCOUNTER — Ambulatory Visit
Admission: RE | Admit: 2018-09-04 | Discharge: 2018-09-04 | Disposition: A | Discharge status: Home / Self Care | Payer: 59 | Source: Ambulatory Visit | Attending: Radiation Oncology | Admitting: Radiation Oncology

## 2018-09-04 DIAGNOSIS — C50812 Malignant neoplasm of overlapping sites of left female breast: Secondary | ICD-10-CM | POA: Diagnosis not present

## 2018-09-05 ENCOUNTER — Ambulatory Visit
Admission: RE | Admit: 2018-09-05 | Discharge: 2018-09-05 | Disposition: A | Discharge status: Home / Self Care | Payer: 59 | Source: Ambulatory Visit | Attending: Radiation Oncology | Admitting: Radiation Oncology

## 2018-09-05 DIAGNOSIS — C50812 Malignant neoplasm of overlapping sites of left female breast: Secondary | ICD-10-CM | POA: Diagnosis not present

## 2018-09-06 ENCOUNTER — Ambulatory Visit
Admission: RE | Admit: 2018-09-06 | Discharge: 2018-09-06 | Disposition: A | Discharge status: Home / Self Care | Payer: 59 | Source: Ambulatory Visit | Attending: Radiation Oncology | Admitting: Radiation Oncology

## 2018-09-06 DIAGNOSIS — C50812 Malignant neoplasm of overlapping sites of left female breast: Secondary | ICD-10-CM | POA: Diagnosis not present

## 2018-09-09 ENCOUNTER — Ambulatory Visit
Admission: RE | Admit: 2018-09-09 | Discharge: 2018-09-09 | Disposition: A | Discharge status: Home / Self Care | Payer: 59 | Source: Ambulatory Visit | Attending: Radiation Oncology | Admitting: Radiation Oncology

## 2018-09-09 ENCOUNTER — Ambulatory Visit: Payer: 59

## 2018-09-09 DIAGNOSIS — C50812 Malignant neoplasm of overlapping sites of left female breast: Secondary | ICD-10-CM | POA: Diagnosis not present

## 2018-09-09 DIAGNOSIS — Z170 Estrogen receptor positive status [ER+]: Principal | ICD-10-CM

## 2018-09-09 MED ORDER — SILVER SULFADIAZINE 1 % EX CREA
TOPICAL_CREAM | Freq: Two times a day (BID) | CUTANEOUS | Status: DC
  Administered 2018-09-09: 16:00:00 via TOPICAL

## 2018-09-10 ENCOUNTER — Ambulatory Visit: Payer: 59

## 2018-09-10 ENCOUNTER — Ambulatory Visit
Admission: RE | Admit: 2018-09-10 | Discharge: 2018-09-10 | Disposition: A | Discharge status: Home / Self Care | Payer: 59 | Source: Ambulatory Visit | Attending: Radiation Oncology | Admitting: Radiation Oncology

## 2018-09-10 DIAGNOSIS — C50812 Malignant neoplasm of overlapping sites of left female breast: Secondary | ICD-10-CM | POA: Diagnosis not present

## 2018-09-11 ENCOUNTER — Ambulatory Visit: Payer: 59

## 2018-09-11 ENCOUNTER — Ambulatory Visit
Admission: RE | Admit: 2018-09-11 | Discharge: 2018-09-11 | Disposition: A | Discharge status: Home / Self Care | Payer: 59 | Source: Ambulatory Visit | Attending: Radiation Oncology | Admitting: Radiation Oncology

## 2018-09-11 DIAGNOSIS — C50812 Malignant neoplasm of overlapping sites of left female breast: Secondary | ICD-10-CM | POA: Diagnosis not present

## 2018-09-12 ENCOUNTER — Ambulatory Visit
Admission: RE | Admit: 2018-09-12 | Discharge: 2018-09-12 | Disposition: A | Discharge status: Home / Self Care | Payer: 59 | Source: Ambulatory Visit | Attending: Radiation Oncology | Admitting: Radiation Oncology

## 2018-09-12 ENCOUNTER — Ambulatory Visit: Payer: 59

## 2018-09-12 DIAGNOSIS — C50812 Malignant neoplasm of overlapping sites of left female breast: Secondary | ICD-10-CM | POA: Diagnosis not present

## 2018-09-13 ENCOUNTER — Ambulatory Visit: Payer: 59

## 2018-09-13 ENCOUNTER — Ambulatory Visit
Admission: RE | Admit: 2018-09-13 | Discharge: 2018-09-13 | Disposition: A | Discharge status: Home / Self Care | Payer: 59 | Source: Ambulatory Visit | Attending: Radiation Oncology | Admitting: Radiation Oncology

## 2018-09-13 DIAGNOSIS — C50812 Malignant neoplasm of overlapping sites of left female breast: Secondary | ICD-10-CM | POA: Diagnosis not present

## 2018-09-16 ENCOUNTER — Ambulatory Visit
Admission: RE | Admit: 2018-09-16 | Discharge: 2018-09-16 | Disposition: A | Discharge status: Home / Self Care | Payer: 59 | Source: Ambulatory Visit | Attending: Radiation Oncology | Admitting: Radiation Oncology

## 2018-09-16 ENCOUNTER — Ambulatory Visit: Payer: 59

## 2018-09-16 DIAGNOSIS — C50812 Malignant neoplasm of overlapping sites of left female breast: Secondary | ICD-10-CM | POA: Insufficient documentation

## 2018-09-16 DIAGNOSIS — Z170 Estrogen receptor positive status [ER+]: Secondary | ICD-10-CM | POA: Diagnosis not present

## 2018-09-16 MED FILL — OSELTAMIVIR PHOSPHATE 75 MG: 75 | 7 days supply | Qty: 7 | Fill #0

## 2018-09-17 ENCOUNTER — Ambulatory Visit
Admission: RE | Admit: 2018-09-17 | Discharge: 2018-09-17 | Disposition: A | Discharge status: Home / Self Care | Payer: 59 | Source: Ambulatory Visit | Attending: Radiation Oncology | Admitting: Radiation Oncology

## 2018-09-17 DIAGNOSIS — C50812 Malignant neoplasm of overlapping sites of left female breast: Secondary | ICD-10-CM | POA: Diagnosis not present

## 2018-09-17 NOTE — Progress Notes (Signed)
[place]  [phone] [phone]    ID: Breanna [name] DOB: Apr 01, 1981  MR#: [id number]  OJJ#:[id number]  Patient Care Team: Ginger Organ as PCP - General (Physician Assistant) Danie Binder, MD as Consulting Physician (Gastroenterology) Magrinat, Virgie Dad, MD as Consulting Physician (Oncology) Eppie Gibson, MD as Attending Physician (Radiation Oncology) Fanny Skates, MD as Consulting Physician (General Surgery) Estill Dooms, NP as Nurse Practitioner (Obstetrics and Gynecology) OTHER MD:   CHIEF COMPLAINT: Estrogen receptor positive multifocal breast cancer  CURRENT TREATMENT: Zoladex/anastrozole   HISTORY OF CURRENT ILLNESS: From the original intake note:  Breanna [name] noted a palpable abnormality in the 4 o'clock location of the LEFT breast sometime in September 2019.  She brought it up to medical attention and underwent bilateral diagnostic mammography with tomography and left breast ultrasonography at [facility] on 04/23/2018 showing: a Suspicious mass in the 9:30 o'clock location of the LEFT breast 2 centimeters from the nipple; by ultrasound this was an irregular hypoechoic mass with spiculated margins and posterior acoustic shadowing which measures 0.5 x 0.7 x 1.1 centimeters. A second similar lesion is also suspected in the same portion of the breast but has not been completely evaluated. Recommend targeted ultrasound of the MEDIAL aspect of the LEFT breast at the time of patient's biopsy and possible second biopsy. LEFT axilla is negative for adenopathy. The areas of clinical concern in the LOWER OUTER QUADRANT of the LEFT breast in the [place] 10 centimeters from the nipple are negative by mammogram and ultrasound.  Accordingly on 05/07/2018 the patient proceeded to biopsy of the left breast areas in question. The pathology from this procedure showed ([id number]): 1. Breast, left, needle  core biopsy, at 9:30 o'clock 3 cm from the nipple: invasive mammary carcinoma, grade II. Mammary carcinoma in situ. 2. Breast, left, needle core biopsy, 9:30 o'clock 2 cm from the nipple with invasive ductal carcinoma, grade I. Mammary carcinoma in situ.  E-cadherin stains are faint in the larger tumor but still positive, strong in the second tumor, and thus these are read as ductal.  Prognostic indicators significant for:  1. Estrogen receptor, 80% positive, moderate staining intensity and progesterone receptor, 80% positive, strong staining intensity. Proliferation marker Ki67 at 10%. HER2 negative by immunohistochemistry, 1+.   2. Estrogen Receptor: 90%, positive, strong staining intensity and progesterone Receptor: 90%, positive, strong staining intensity. Proliferation Marker Ki67: 10%. HER2 negative by immunohistochemistry, 1+.    The patient's subsequent history is as detailed below.   INTERVAL HISTORY: Breanna [name] returns today for follow-up and treatment of her estrogen receptor positive multifocal breast cancer. She is accompanied by her husband.  She completed adjuvant radiation, with her final treatment on 09/17/2018.  Since her last visit here, she has not undergone any additional studies.    REVIEW OF SYSTEMS: Breanna [name] had some pain from surgery, but it was very mild. She has continued working, but she took Tuesdays and Fridays off for treatments. She gets around 5 to 8 thousand steps per day at work. The patient denies unusual headaches, visual changes, nausea, vomiting, or dizziness. There has been no unusual cough, phlegm production, or pleurisy. This been no change in bowel or bladder habits. The patient denies unexplained fatigue or unexplained weight loss, bleeding, rash, or fever. A detailed review of systems was otherwise noncontributory.    PAST MEDICAL HISTORY: Past Medical History:  Diagnosis Date  . Anxiety   . Arthritis    knees  .  Asthma   . Breast cancer ([place])  05/2018   left   . Cluster headaches   . Cough 06/11/2018  . Dental crown present   . Family history of bladder cancer   . Family history of kidney cancer   . Family history of lung cancer   . Family history of stomach cancer   . GERD (gastroesophageal reflux disease)   . Heart palpitations    occasional - no cardiologist  . History of asthma    as a child - prn inhaler  . Obesity   . Pseudotumor cerebri syndrome   . Seasonal allergies   . Stuffy nose 06/11/2018    PAST SURGICAL HISTORY: Past Surgical History:  Procedure Laterality Date  . BIOPSY  11/08/2015   Procedure: BIOPSY;  Surgeon: Danie Binder, MD;  [facility];  Service: Endoscopy;;  Gastric bx and Gastric polyp bx  . BREAST LUMPECTOMY WITH RADIOACTIVE SEED AND SENTINEL LYMPH NODE BIOPSY Left 06/24/2018   Procedure: LEFT BREAST LUMPECTOMY WITH  BRACKETED RADIOACTIVE SEED AND LEFT AXILLARY DEEP SENTINEL LYMPH NODE BIOPSY AND BLUE DYE INJECTION;  Surgeon: Fanny Skates, MD;  Location: [facility];  Service: General;  Laterality: Left;  . CESAREAN SECTION  11/03/2003  . CESAREAN SECTION  05/15/2006  . CHOLECYSTECTOMY    . COLONOSCOPY N/A 11/08/2015   Procedure: COLONOSCOPY;  Surgeon: Danie Binder, MD;  [facility];  Service: Endoscopy;  Laterality: N/A;  1000  . ESOPHAGOGASTRODUODENOSCOPY N/A 11/08/2015   Procedure: ESOPHAGOGASTRODUODENOSCOPY (EGD);  Surgeon: Danie Binder, MD;  [facility];  Service: Endoscopy;  Laterality: N/A;  . HEMORRHOID BANDING N/A 11/08/2015   Procedure: HEMORRHOID BANDING;  Surgeon: Danie Binder, MD;  [facility];  Service: Endoscopy;  Laterality: N/A;  . SAVORY DILATION N/A 11/08/2015   Procedure: SAVORY DILATION;  Surgeon: Danie Binder, MD;  [facility];  Service: Endoscopy;  Laterality: N/A;  . WISDOM TOOTH EXTRACTION      FAMILY HISTORY: Family History  Problem Relation Age of Onset  . COPD Mother   .  Pneumonia Mother   . Hyperlipidemia Father   . Kidney cancer Father 75       "simple renal cell carcinoma"  . Heart disease Maternal Aunt   . Hypertension Maternal Grandmother   . Diabetes Maternal Grandmother   . Stroke Paternal Grandmother   . Hypertension Paternal Grandmother   . Diabetes Paternal Grandmother   . Stomach cancer Paternal Grandmother 4       had a portion of stomah removed  . Diabetes Paternal Grandfather   . Heart disease Paternal Grandfather   . Hyperlipidemia Paternal Grandfather   . Hypertension Paternal Grandfather   . Bladder Cancer Maternal Uncle        hx smoking  . Lung cancer Other    As of November 2019 her father is 69 years old. Patients' mother died from pneumonia at age 59. The patient has 0 brothers and 0 sisters. Patient denies any family story of ovarian or breast cancer. Her paternal grandmother had gastric cancer in 2000. Her paternal aunt had lung cancer without a hx of smoking. Her maternal uncle has had bladder cancer.    GYNECOLOGIC HISTORY:  Patient's last menstrual period was 08/24/2018 (exact date). Menarche: 38 years old Age at first live birth: 38 years old [place] P2 LMP: No Contraceptive: Implanon in place.  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 Her husband Breanna [name] works for the [employer] as an Mining engineer. Her two children are 35.27 years old and 39 years old.  The patient attends full [employer] in [place].   ADVANCED DIRECTIVES:  n/a   HEALTH MAINTENANCE: Social History   Tobacco Use  . Smoking status: Never Smoker  . Smokeless tobacco: Never Used  Substance Use Topics  . Alcohol use: No  . Drug use: No     Colonoscopy: 2017  PAP: 2017  Bone density: No   Allergies  Allergen Reactions  . Adhesive [Tape] Other (See Comments)    BLISTERS  . Latex Hives and Itching  . Topamax [Topiramate] Other (See Comments)     ALTERED MENTAL STATUS  . Celecoxib Palpitations  . Codeine Itching and Rash  . Penicillins Itching and Rash       . Sulfa Antibiotics Itching and Rash    Current Outpatient Medications  Medication Sig Dispense Refill  . acetaZOLAMIDE (DIAMOX) 500 MG capsule Take 500 mg by mouth daily.    Marland Kitchen albuterol (PROVENTIL HFA;VENTOLIN HFA) 108 (90 Base) MCG/ACT inhaler Inhale into the lungs every 6 (six) hours as needed for wheezing or shortness of breath.    . ALPRAZolam (XANAX) 0.5 MG tablet Take 1 tablet by mouth daily as needed for anxiety.     . busPIRone (BUSPAR) 7.5 MG tablet Take 7.5 mg by mouth daily.     . cetirizine (ZYRTEC) 10 MG tablet Take 10 mg by mouth daily.    . Cyanocobalamin (VITAMIN B 12) 500 MCG TABS Take by mouth.    Marland Kitchen HYDROcodone-acetaminophen (NORCO) 5-325 MG tablet Take 1-2 tablets by mouth every 6 (six) hours as needed for moderate pain or severe pain. (Patient not taking: Reported on 07/24/2018) 30 tablet 0  . pantoprazole (PROTONIX) 40 MG tablet Take 40 mg by mouth daily.    Marland Kitchen zonisamide (ZONEGRAN) 25 MG capsule Take 50 mg by mouth daily.      No current facility-administered medications for this visit.     OBJECTIVE: Breanna [name] in no acute distress  Vitals:   09/18/18 1001  BP: 129/61  Pulse: 90  Resp: 18  Temp: 98.5 F (36.9 C)  SpO2: 100%     Body mass index is 43.75 kg/m.   Wt Readings from Last 3 Encounters:  09/18/18 262 lb 14.4 oz (119.3 kg)  07/24/18 261 lb 6.4 oz (118.6 kg)  07/15/18 258 lb 4.8 oz (117.2 kg)      ECOG FS:1 - Symptomatic but completely ambulatory  Sclerae unicteric, pupils round and equal No cervical or supraclavicular adenopathy Lungs no rales or rhonchi Heart regular rate and rhythm Abd soft, nontender, positive bowel sounds MSK no focal spinal tenderness, no upper extremity lymphedema Neuro: nonfocal, well oriented, appropriate affect Breasts: The right breast is benign.  The left breast is status post  lumpectomy and radiation.  There is hyperpigmentation, skin coarsening, and the breast is smaller than the right, but otherwise the cosmetic result is good.  Both axillae are benign.   LAB RESULTS:  CMP     Component Value Date/Time   NA 138 06/17/2018 0849   K 3.5 06/17/2018 0849   CL 109 06/17/2018 0849   CO2 23 06/17/2018 0849   GLUCOSE 116 (H) 06/17/2018 0849   BUN 11 06/17/2018 0849   CREATININE 1.20 (H) 06/17/2018 0849   CREATININE 1.05 (H)  05/22/2018 1158   CREATININE 1.08 08/22/2013 1039   CALCIUM 8.6 (L) 06/17/2018 0849   PROT 7.1 06/17/2018 0849   ALBUMIN 3.8 06/17/2018 0849   AST 25 06/17/2018 0849   AST 19 05/22/2018 1158   ALT 33 06/17/2018 0849   ALT 25 05/22/2018 1158   ALKPHOS 41 06/17/2018 0849   BILITOT 0.8 06/17/2018 0849   BILITOT 0.6 05/22/2018 1158   GFRNONAA 57 (L) 06/17/2018 0849   GFRNONAA >60 05/22/2018 1158   GFRAA >60 06/17/2018 0849   GFRAA >60 05/22/2018 1158    No results found for: TOTALPROTELP, ALBUMINELP, A1GS, A2GS, BETS, BETA2SER, GAMS, MSPIKE, SPEI  No results found for: KPAFRELGTCHN, LAMBDASER, [facility]  Lab Results  Component Value Date   WBC 10.1 06/17/2018   NEUTROABS 6.7 06/17/2018   HGB 14.3 06/17/2018   HCT 42.9 06/17/2018   MCV 97.3 06/17/2018   PLT 282 06/17/2018    _0 @  No results found for: LABCA2  No components found for: [id number]  No results for input(s): INR in the last 168 hours.  No results found for: LABCA2  No results found for: GQQ761  No results found for: PJK932  No results found for: IZT245  No results found for: CA2729  No components found for: HGQUANT  No results found for: CEA1 / No results found for: CEA1   No results found for: AFPTUMOR  No results found for: CHROMOGRNA  No results found for: PSA1  No visits with results within 3 Day(s) from this visit.  Latest known visit with results is:  Hospital Outpatient Visit on 07/24/2018  Component Date Value Ref  Range Status  . Preg Test, Ur 07/24/2018 NEGATIVE  NEGATIVE Final   Comment:        THE SENSITIVITY OF THIS METHODOLOGY IS >20 mIU/mL. Performed at [facility], [place] [address], [place], [place] [address]     (this displays the last labs from the last 3 days)  No results found for: TOTALPROTELP, ALBUMINELP, A1GS, A2GS, BETS, BETA2SER, GAMS, MSPIKE, SPEI (this displays SPEP labs)  No results found for: KPAFRELGTCHN, LAMBDASER, KAPLAMBRATIO (kappa/lambda light chains)  No results found for: HGBA, HGBA2QUANT, HGBFQUANT, HGBSQUAN (Hemoglobinopathy evaluation)   No results found for: LDH  No results found for: IRON, TIBC, IRONPCTSAT (Iron and TIBC)  No results found for: FERRITIN  Urinalysis No results found for: COLORURINE, APPEARANCEUR, LABSPEC, PHURINE, GLUCOSEU, HGBUR, BILIRUBINUR, KETONESUR, PROTEINUR, UROBILINOGEN, NITRITE, LEUKOCYTESUR   STUDIES:  No results found.   ELIGIBLE FOR AVAILABLE RESEARCH PROTOCOL: no   ASSESSMENT: 38 y.o. [name], status post left breast biopsy x2 on 05/07/2018, showing (a) left breast upper inner quadrant 2 cm from the nipple: a clinical T1b N0, stage IA invasive ductal carcinoma, grade 1, estrogen and progesterone receptor strongly positive, HER-2 not amplified, with an MIB-1 of 10%; strongly E-cadherin positive (b) left breast upper inner quadrant 3 cm from the nipple, a clinical T1c N0, stage IA invasive ductal carcinoma, grade 2, estrogen receptor positive with moderate staining intensity, progesterone receptor positive with strong staining intensity, with an MIB-1 of 10%, and no HER-2 amplification; faint but positive E-cadherin stain  (1) genetics testing 05/28/2018 through the Multi-Cancer Panel offered by [facility], no deleterious mutations in AIP, ALK, APC, ATM, AXIN2, BAP1, BARD1, BLM, BMPR1A, BRCA1, BRCA2, BRIP1, BUB1B, CASR, CDC73, CDH1, CDK4, CDKN1B, CDKN1C, CDKN2A, CEBPA, CEP57, CHEK2,  CTNNA1, DICER1, DIS3L2, EGFR, ENG, EPCAM, FH, FLCN, GALNT12, GATA2, GPC3, GREM1, HOXB13, HRAS, KIT, MAX, MEN1, MET, MITF, MLH1,  MLH3, MSH2, MSH3, MSH6, MUTYH, NBN, NF1, NF2, NTHL1, PALB2, PDGFRA, PHOX2B, PMS2, POLD1, POLE, POT1, PRKAR1A, PTCH1, PTEN, RAD50, RAD51C, RAD51D, RB1, RECQL4, RET, RNF43, RPS20, RUNX1, SDHA, SDHAF2, SDHB, SDHC, SDHD, SMAD4, SMARCA4, SMARCB1, SMARCE1, STK11, SUFU, TERC, TERT, TMEM127, TP53, TSC1, TSC2, VHL, WRN, WT1  (2) status post left lumpectomy and sentinel lymph node sampling 06/24/2018 for a pT2 pN1, stage IIA invasive ductal carcinoma, grade 1, with negative margins  (a) a total of 2 sentinel lymph nodes were removed  (3) MammaPrint read as low risk, with an average 10-year risk of recurrence with no treatment of 10%, the 5-year disease-free survival being 97.8% with hormone treatment alone  (4) adjuvant radiation completed 09/17/2018  (5) anastrozole started 09/19/2018  (a) goserelin started 09/25/2018   PLAN: Saoirse has completed local treatment for her breast cancer and she is now ready for systemic therapy.  We reviewed the fact that 3 years ago she would definitely have received chemotherapy since her cancer was node positive.  Relying on the MammaPrint assay allows [place] to spare patients like her the chemotherapy, and still give her a good prognosis.  She is a candidate for tamoxifen as a [name] who is still premenopausal and we discussed the possible toxicities side effects and complications of that agent.  However my recommendation is that we go with goserelin, put her ovaries "to sleep temporarily", and use anastrozole.  This has been shown to improve not only disease-free survival but overall survival in women like her.  We talked about the possible toxicity side effects and complications of this agents and also of menopause in general.  She is agreeable to giving the goserelin a try.  She will have her first dose 09/25/2018.  The second dose will be  10/25/2018 when she already is scheduled to see Dr. Lanell Persons and she will see me that day.  She will start her anastrozole this week.  She will have a bone density sometime within the next 2 months  She knows to call for any other issue that may develop before the next visit.    Domique Reardon, Virgie Dad, MD  09/18/18 10:57 AM Medical Oncology and Hematology [facility] [address] [place], [place] [address] Tel. [phone]    Fax. [phone]   I, Jacqualyn Posey am acting as a Education administrator for Chauncey Cruel, MD.   I, Lurline Del MD, have reviewed the above documentation for accuracy and completeness, and I agree with the above.

## 2018-09-18 ENCOUNTER — Inpatient Hospital Stay: Payer: 59 | Attending: Oncology | Admitting: Oncology

## 2018-09-18 ENCOUNTER — Telehealth: Payer: Self-pay | Admitting: Oncology

## 2018-09-18 VITALS — BP 129/61 | HR 90 | Temp 98.5°F | Resp 18 | Ht 65.0 in | Wt 262.9 lb

## 2018-09-18 DIAGNOSIS — Z5111 Encounter for antineoplastic chemotherapy: Secondary | ICD-10-CM | POA: Diagnosis not present

## 2018-09-18 DIAGNOSIS — Z923 Personal history of irradiation: Secondary | ICD-10-CM | POA: Diagnosis not present

## 2018-09-18 DIAGNOSIS — M129 Arthropathy, unspecified: Secondary | ICD-10-CM

## 2018-09-18 DIAGNOSIS — K219 Gastro-esophageal reflux disease without esophagitis: Secondary | ICD-10-CM | POA: Diagnosis not present

## 2018-09-18 DIAGNOSIS — Z8051 Family history of malignant neoplasm of kidney: Secondary | ICD-10-CM | POA: Insufficient documentation

## 2018-09-18 DIAGNOSIS — J45909 Unspecified asthma, uncomplicated: Secondary | ICD-10-CM | POA: Insufficient documentation

## 2018-09-18 DIAGNOSIS — E669 Obesity, unspecified: Secondary | ICD-10-CM | POA: Diagnosis not present

## 2018-09-18 DIAGNOSIS — Z79811 Long term (current) use of aromatase inhibitors: Secondary | ICD-10-CM | POA: Diagnosis not present

## 2018-09-18 DIAGNOSIS — F419 Anxiety disorder, unspecified: Secondary | ICD-10-CM

## 2018-09-18 DIAGNOSIS — C50212 Malignant neoplasm of upper-inner quadrant of left female breast: Secondary | ICD-10-CM | POA: Insufficient documentation

## 2018-09-18 DIAGNOSIS — Z8052 Family history of malignant neoplasm of bladder: Secondary | ICD-10-CM | POA: Insufficient documentation

## 2018-09-18 DIAGNOSIS — Z170 Estrogen receptor positive status [ER+]: Secondary | ICD-10-CM | POA: Diagnosis not present

## 2018-09-18 DIAGNOSIS — R002 Palpitations: Secondary | ICD-10-CM | POA: Diagnosis not present

## 2018-09-18 DIAGNOSIS — G932 Benign intracranial hypertension: Secondary | ICD-10-CM | POA: Insufficient documentation

## 2018-09-18 DIAGNOSIS — Z79899 Other long term (current) drug therapy: Secondary | ICD-10-CM | POA: Insufficient documentation

## 2018-09-18 DIAGNOSIS — C50812 Malignant neoplasm of overlapping sites of left female breast: Secondary | ICD-10-CM

## 2018-09-18 MED ORDER — ANASTROZOLE 1 MG PO TABS: 1.0000 mg | ORAL_TABLET | Freq: Every day | ORAL | 4 refills | Status: DC

## 2018-09-18 NOTE — Addendum Note (Signed)
Addended by: Chauncey Cruel on: 09/18/2018 12:47 PM   Modules accepted: Orders

## 2018-09-18 NOTE — Telephone Encounter (Signed)
Gave avs and calendar ° °

## 2018-09-20 ENCOUNTER — Encounter: Payer: Self-pay | Admitting: Radiation Oncology

## 2018-09-20 NOTE — Progress Notes (Signed)
  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 Left Breast and IM nodes/ 50.4 Gy in 28 fractions of 1.8 Gy 2. Left post axilla/ supraclavicular / 50.4 Gy in 28 fractions of 1.8 Gy 3. Boost / 10 Gy in 5 fractions of 2 Gy  Beams/energy:    1. 3D, photons / 10X, 15X 2. 3D, photons / 10X, 15X 3. Complex Isodose, photons / 6X, 10X  Narrative: The patient tolerated radiation treatment relatively well. She reported mild fatigue throughout treatment. She reported increase in pain and soreness as treatments went on. She experienced redness, swelling, and itching to the whole breast, and moist peeling with eventual resolving desquamation to the supraclavicular region.   Plan: The patient has completed radiation treatment. The patient will followup in one month. I advised them to call or return sooner if they have any questions or concerns related to their recovery or treatment.  -----------------------------------  Eppie Gibson, MD  This document serves as a record of services personally performed by Eppie Gibson, MD. It was created on her behalf by Wilburn Mylar, a trained medical scribe. The creation of this record is based on the scribe's personal observations and the provider's statements to them. This document has been checked and approved by the attending provider.

## 2018-09-24 ENCOUNTER — Inpatient Hospital Stay: Payer: 59

## 2018-09-24 DIAGNOSIS — C50212 Malignant neoplasm of upper-inner quadrant of left female breast: Secondary | ICD-10-CM | POA: Diagnosis not present

## 2018-09-24 DIAGNOSIS — C50812 Malignant neoplasm of overlapping sites of left female breast: Secondary | ICD-10-CM

## 2018-09-24 DIAGNOSIS — Z170 Estrogen receptor positive status [ER+]: Principal | ICD-10-CM

## 2018-09-24 MED ORDER — GOSERELIN ACETATE 3.6 MG ~~LOC~~ IMPL
3.6000 mg | DRUG_IMPLANT | Freq: Once | SUBCUTANEOUS | Status: AC
  Administered 2018-09-24: 3.6 mg via SUBCUTANEOUS

## 2018-09-24 MED ORDER — GOSERELIN ACETATE 3.6 MG ~~LOC~~ IMPL
DRUG_IMPLANT | SUBCUTANEOUS | Status: AC
  Filled 2018-09-24: qty 3.6

## 2018-09-24 NOTE — Patient Instructions (Addendum)
Goserelin injection What is this medicine? GOSERELIN (GOE se rel in) is similar to a hormone found in the body. It lowers the amount of sex hormones that the body makes. Men will have lower testosterone levels and women will have lower estrogen levels while taking this medicine. In men, this medicine is used to treat prostate cancer; the injection is either given once per month or once every 12 weeks. A once per month injection (only) is used to treat women with endometriosis, dysfunctional uterine bleeding, or advanced breast cancer. This medicine may be used for other purposes; ask your health care provider or pharmacist if you have questions. COMMON BRAND NAME(S): Zoladex What should I tell my health care provider before I take this medicine? They need to know if you have any of these conditions (some only apply to women): -diabetes -heart disease or previous heart attack -high blood pressure -high cholesterol -kidney disease -osteoporosis or low bone density -problems passing urine -spinal cord injury -stroke -tobacco smoker -an unusual or allergic reaction to goserelin, hormone therapy, other medicines, foods, dyes, or preservatives -pregnant or trying to get pregnant -breast-feeding How should I use this medicine? This medicine is for injection under the skin. It is given by a health care professional in a hospital or clinic setting. Men receive this injection once every 4 weeks or once every 12 weeks. Women will only receive the once every 4 weeks injection. Talk to your pediatrician regarding the use of this medicine in children. Special care may be needed. Overdosage: If you think you have taken too much of this medicine contact a poison control center or emergency room at once. NOTE: This medicine is only for you. Do not share this medicine with others. Anastrozole tablets What is this medicine? ANASTROZOLE (an AS troe zole) is used to treat breast cancer in women who have gone  through menopause. Some types of breast cancer depend on estrogen to grow, and this medicine can stop tumor growth by blocking estrogen production. This medicine may be used for other purposes; ask your health care provider or pharmacist if you have questions. COMMON BRAND NAME(S): Arimidex What should I tell my health care provider before I take this medicine? They need to know if you have any of these conditions: -bone problems -heart disease -high cholesterol -an unusual or allergic reaction to anastrozole, other medicines, foods, dyes, or preservatives -pregnant or trying to get pregnant -breast-feeding How should I use this medicine? Take this medicine by mouth with a glass of water. Follow the directions on the prescription label. You can take it with or without food. If it upsets your stomach, take it with food. Take your medicine at regular intervals. Do not take it more often than directed. Do not stop taking except on your doctor's advice. Talk to your pediatrician regarding the use of this medicine in children. Special care may be needed. Overdosage: If you think you have taken too much of this medicine contact a poison control center or emergency room at once. NOTE: This medicine is only for you. Do not share this medicine with others. What if I miss a dose? If you miss a dose, take it as soon as you can. If it is almost time for your next dose, take only that dose. Do not take double or extra doses. What may interact with this medicine? This medicine may interact with the following medications: -female hormones, like estrogens or progestins and birth control pills, patches, rings, or injections -tamoxifen This   list may not describe all possible interactions. Give your health care provider a list of all the medicines, herbs, non-prescription drugs, or dietary supplements you use. Also tell them if you smoke, drink alcohol, or use illegal drugs. Some items may interact with your  medicine. What should I watch for while using this medicine? Visit your doctor or health care professional for regular checks on your progress. Let your doctor or health care professional know about any unusual vaginal bleeding. Do not become pregnant while taking this medicine or for at least 3 weeks after stopping it. Women should inform their doctor if they wish to become pregnant or think they might be pregnant. There is a potential for serious side effects to an unborn child. Talk to your health care professional or pharmacist for more information. Do not breast-feed an infant while taking this medicine or for 2 weeks after stopping it. This medicine may interfere with the ability to have a child. Talk with your doctor or health care professional if you are concerned about your fertility. Using this medicine for a long time may increase your risk of low bone mass. Talk to your doctor about bone health. You should make sure that you get enough calcium and vitamin D while you are taking this medicine. Discuss the foods you eat and the vitamins you take with your health care professional. What side effects may I notice from receiving this medicine? Side effects that you should report to your doctor or health care professional as soon as possible: -allergic reactions like skin rash, itching or hives, swelling of the face, lips, or tongue -signs and symptoms of a blood clot such as breathing problems; changes in vision; chest pain; sudden headache; pain, swelling, warmth in the leg; trouble speaking; sudden numbness or weakness of the face, arm, or leg -signs and symptoms of infection like fever or chills; cough; sore throat; pain or trouble passing urine Side effects that usually do not require medical attention (report to your doctor or health care professional if they continue or are bothersome): -bone pain -dizziness -hair loss -headache -hot flashes -joint pain -muscle pain -signs of  decreased red blood cells - unusually weak or tired, feeling faint or lightheaded, falls -vaginal discharge, itching, or odor in women This list may not describe all possible side effects. Call your doctor for medical advice about side effects. You may report side effects to FDA at 1-800-FDA-1088. Where should I keep my medicine? Keep out of the reach of children. Store at room temperature between 20 and 25 degrees C (68 and 77 degrees F). Throw away any unused medicine after the expiration date. NOTE: This sheet is a summary. It may not cover all possible information. If you have questions about this medicine, talk to your doctor, pharmacist, or health care provider.  2019 [employer] Standard (2017-07-16 14:56:51)  

## 2018-09-25 ENCOUNTER — Ambulatory Visit: Payer: 59

## 2018-10-16 ENCOUNTER — Other Ambulatory Visit: Payer: Self-pay | Admitting: Oncology

## 2018-10-16 NOTE — Progress Notes (Unsigned)
Patient called 10/14/2018 regarding the possibility or need of taking Plan B.  This was referred to her gynecologist who said the patient could take Plan B.  Of course once the patient has received 3 doses of goserelin that should take care of the problem in the future

## 2018-10-21 ENCOUNTER — Telehealth: Payer: Self-pay | Admitting: Oncology

## 2018-10-21 NOTE — Telephone Encounter (Signed)
Per 4/2 schedule message cancelled 4/10 f/u and moved injection closer to radonc appointment. Message to GM to clarify when June webex f/u should be. Left message for patient re change to 4/10 appointments.

## 2018-10-22 ENCOUNTER — Telehealth: Payer: Self-pay | Admitting: Oncology

## 2018-10-22 ENCOUNTER — Encounter: Payer: Self-pay | Admitting: Radiation Oncology

## 2018-10-22 NOTE — Telephone Encounter (Signed)
Cancelled 4/10 md appt per sch msg. Scheduled injections. Called and confirmed dates and times with patient.

## 2018-10-22 NOTE — Progress Notes (Signed)
I called the patient today about her upcoming follow-up appointment in radiation oncology.   Given concerns about the COVID-19 pandemic, I offered a phone assessment with the patient to determine if coming to the clinic was necessary. She accepted.  I let the patient know that I had spoken with Dr. Isidore Moos, and she wanted them to know the importance of washing their hands for at least 20 seconds at a time, especially after going out in public, and before they eat.  Limit going out in public whenever possible. Do not touch your face, unless your hands are clean, such as when bathing. Get plenty of rest, eat well, and stay hydrated.   The patient denies any symptomatic concerns.  Specifically, they report good healing of their skin in the radiation fields.  Skin is intact.    I recommended that she continue skin care by applying oil or lotion with vitamin E to the skin in the radiation fields, BID, for 2 more months.  Continue follow-up with medical oncology - follow-up is scheduled on 12/24/18 with Dr. Jana Hakim.  I explained that yearly mammograms are important for patients with intact breast tissue, and physical exams are important after mastectomy for patients that cannot undergo mammography.  I encouraged her to call if she had further questions or concerns about her healing. Otherwise, she will follow-up PRN in radiation oncology. Patient is pleased with this plan, and we will cancel her upcoming follow-up to reduce the risk of COVID-19 transmission.

## 2018-10-24 ENCOUNTER — Encounter: Payer: Self-pay | Admitting: *Deleted

## 2018-10-24 NOTE — Telephone Encounter (Signed)
Per response from GM re when webex f/u was to be - f/u should be 6/25 in the AM. Per GM ok to overbook. Cancelled 6/9 f/u and scheduled as webex for 6/25. All other appointments remain the same. Confirmed with patient.

## 2018-10-25 ENCOUNTER — Inpatient Hospital Stay: Payer: 59 | Attending: Oncology

## 2018-10-25 ENCOUNTER — Inpatient Hospital Stay: Payer: 59

## 2018-10-25 ENCOUNTER — Inpatient Hospital Stay: Payer: 59 | Admitting: Oncology

## 2018-10-25 ENCOUNTER — Other Ambulatory Visit: Payer: Self-pay

## 2018-10-25 ENCOUNTER — Inpatient Hospital Stay
Admission: RE | Admit: 2018-10-25 | Discharge: 2018-10-25 | Disposition: A | Discharge status: Home / Self Care | Payer: Self-pay | Source: Ambulatory Visit | Attending: Radiation Oncology | Admitting: Radiation Oncology

## 2018-10-25 VITALS — BP 122/80 | HR 90 | Temp 98.0°F | Resp 18

## 2018-10-25 DIAGNOSIS — Z5111 Encounter for antineoplastic chemotherapy: Secondary | ICD-10-CM | POA: Diagnosis not present

## 2018-10-25 DIAGNOSIS — C50212 Malignant neoplasm of upper-inner quadrant of left female breast: Secondary | ICD-10-CM | POA: Diagnosis present

## 2018-10-25 DIAGNOSIS — C50812 Malignant neoplasm of overlapping sites of left female breast: Secondary | ICD-10-CM

## 2018-10-25 DIAGNOSIS — Z170 Estrogen receptor positive status [ER+]: Secondary | ICD-10-CM | POA: Diagnosis not present

## 2018-10-25 HISTORY — DX: Personal history of irradiation: Z92.3

## 2018-10-25 MED ORDER — GOSERELIN ACETATE 3.6 MG ~~LOC~~ IMPL
3.6000 mg | DRUG_IMPLANT | Freq: Once | SUBCUTANEOUS | Status: AC
  Administered 2018-10-25: 3.6 mg via SUBCUTANEOUS

## 2018-10-25 MED ORDER — GOSERELIN ACETATE 3.6 MG ~~LOC~~ IMPL
DRUG_IMPLANT | SUBCUTANEOUS | Status: AC
  Filled 2018-10-25: qty 3.6

## 2018-10-25 NOTE — Patient Instructions (Signed)
Goserelin injection What is this medicine? GOSERELIN (GOE se rel in) is similar to a hormone found in the body. It lowers the amount of sex hormones that the body makes. Men will have lower testosterone levels and women will have lower estrogen levels while taking this medicine. In men, this medicine is used to treat prostate cancer; the injection is either given once per month or once every 12 weeks. A once per month injection (only) is used to treat women with endometriosis, dysfunctional uterine bleeding, or advanced breast cancer. This medicine may be used for other purposes; ask your health care provider or pharmacist if you have questions. COMMON BRAND NAME(S): Zoladex What should I tell my health care provider before I take this medicine? They need to know if you have any of these conditions (some only apply to women): -diabetes -heart disease or previous heart attack -high blood pressure -high cholesterol -kidney disease -osteoporosis or low bone density -problems passing urine -spinal cord injury -stroke -tobacco smoker -an unusual or allergic reaction to goserelin, hormone therapy, other medicines, foods, dyes, or preservatives -pregnant or trying to get pregnant -breast-feeding How should I use this medicine? This medicine is for injection under the skin. It is given by a health care professional in a hospital or clinic setting. Men receive this injection once every 4 weeks or once every 12 weeks. Women will only receive the once every 4 weeks injection. Talk to your pediatrician regarding the use of this medicine in children. Special care may be needed. Overdosage: If you think you have taken too much of this medicine contact a poison control center or emergency room at once. NOTE: This medicine is only for you. Do not share this medicine with others. What if I miss a dose? It is important not to miss your dose. Call your doctor or health care professional if you are unable to  keep an appointment. What may interact with this medicine? -female hormones like estrogen -herbal or dietary supplements like black cohosh, chasteberry, or DHEA -female hormones like testosterone -prasterone This list may not describe all possible interactions. Give your health care provider a list of all the medicines, herbs, non-prescription drugs, or dietary supplements you use. Also tell them if you smoke, drink alcohol, or use illegal drugs. Some items may interact with your medicine. What should I watch for while using this medicine? Visit your doctor or health care professional for regular checks on your progress. Your symptoms may appear to get worse during the first weeks of this therapy. Tell your doctor or healthcare professional if your symptoms do not start to get better or if they get worse after this time. Your bones may get weaker if you take this medicine for a long time. If you smoke or frequently drink alcohol you may increase your risk of bone loss. A family history of osteoporosis, chronic use of drugs for seizures (convulsions), or corticosteroids can also increase your risk of bone loss. Talk to your doctor about how to keep your bones strong. This medicine should stop regular monthly menstration in women. Tell your doctor if you continue to menstrate. Women should not become pregnant while taking this medicine or for 12 weeks after stopping this medicine. Women should inform their doctor if they wish to become pregnant or think they might be pregnant. There is a potential for serious side effects to an unborn child. Talk to your health care professional or pharmacist for more information. Do not breast-feed an infant while taking   this medicine. Men should inform their doctors if they wish to father a child. This medicine may lower sperm counts. Talk to your health care professional or pharmacist for more information. What side effects may I notice from receiving this  medicine? Side effects that you should report to your doctor or health care professional as soon as possible: -allergic reactions like skin rash, itching or hives, swelling of the face, lips, or tongue -bone pain -breathing problems -changes in vision -chest pain -feeling faint or lightheaded, falls -fever, chills -pain, swelling, warmth in the leg -pain, tingling, numbness in the hands or feet -signs and symptoms of low blood pressure like dizziness; feeling faint or lightheaded, falls; unusually weak or tired -stomach pain -swelling of the ankles, feet, hands -trouble passing urine or change in the amount of urine -unusually high or low blood pressure -unusually weak or tired Side effects that usually do not require medical attention (report to your doctor or health care professional if they continue or are bothersome): -change in sex drive or performance -changes in breast size in both males and females -changes in emotions or moods -headache -hot flashes -irritation at site where injected -loss of appetite -skin problems like acne, dry skin -vaginal dryness This list may not describe all possible side effects. Call your doctor for medical advice about side effects. You may report side effects to FDA at 1-800-FDA-1088. Where should I keep my medicine? This drug is given in a hospital or clinic and will not be stored at home. NOTE: This sheet is a summary. It may not cover all possible information. If you have questions about this medicine, talk to your doctor, pharmacist, or health care provider.  2019 [employer]/Gold Standard (2013-09-09 11:10:35)  

## 2018-10-28 ENCOUNTER — Telehealth: Payer: Self-pay | Admitting: Adult Health

## 2018-10-28 NOTE — Telephone Encounter (Signed)
Scheduled SCP in September per sch msg. Mailed printout

## 2018-11-13 DIAGNOSIS — J22 Unspecified acute lower respiratory infection: Secondary | ICD-10-CM | POA: Diagnosis not present

## 2018-11-13 DIAGNOSIS — J019 Acute sinusitis, unspecified: Secondary | ICD-10-CM | POA: Diagnosis not present

## 2018-11-22 ENCOUNTER — Inpatient Hospital Stay: Payer: 59 | Attending: Oncology

## 2018-11-22 ENCOUNTER — Other Ambulatory Visit: Payer: Self-pay

## 2018-11-22 DIAGNOSIS — C50812 Malignant neoplasm of overlapping sites of left female breast: Secondary | ICD-10-CM

## 2018-11-22 DIAGNOSIS — Z5111 Encounter for antineoplastic chemotherapy: Secondary | ICD-10-CM | POA: Insufficient documentation

## 2018-11-22 DIAGNOSIS — C50212 Malignant neoplasm of upper-inner quadrant of left female breast: Secondary | ICD-10-CM | POA: Diagnosis not present

## 2018-11-22 DIAGNOSIS — Z170 Estrogen receptor positive status [ER+]: Secondary | ICD-10-CM | POA: Diagnosis not present

## 2018-11-22 MED ORDER — GOSERELIN ACETATE 3.6 MG ~~LOC~~ IMPL
3.6000 mg | DRUG_IMPLANT | Freq: Once | SUBCUTANEOUS | Status: AC
  Administered 2018-11-22: 15:00:00 3.6 mg via SUBCUTANEOUS

## 2018-11-22 MED ORDER — GOSERELIN ACETATE 3.6 MG ~~LOC~~ IMPL
DRUG_IMPLANT | SUBCUTANEOUS | Status: AC
  Filled 2018-11-22: qty 3.6

## 2018-11-22 NOTE — Patient Instructions (Signed)
Goserelin injection What is this medicine? GOSERELIN (GOE se rel in) is similar to a hormone found in the body. It lowers the amount of sex hormones that the body makes. Men will have lower testosterone levels and women will have lower estrogen levels while taking this medicine. In men, this medicine is used to treat prostate cancer; the injection is either given once per month or once every 12 weeks. A once per month injection (only) is used to treat women with endometriosis, dysfunctional uterine bleeding, or advanced breast cancer. This medicine may be used for other purposes; ask your health care provider or pharmacist if you have questions. COMMON BRAND NAME(S): Zoladex What should I tell my health care provider before I take this medicine? They need to know if you have any of these conditions (some only apply to women): -diabetes -heart disease or previous heart attack -high blood pressure -high cholesterol -kidney disease -osteoporosis or low bone density -problems passing urine -spinal cord injury -stroke -tobacco smoker -an unusual or allergic reaction to goserelin, hormone therapy, other medicines, foods, dyes, or preservatives -pregnant or trying to get pregnant -breast-feeding How should I use this medicine? This medicine is for injection under the skin. It is given by a health care professional in a hospital or clinic setting. Men receive this injection once every 4 weeks or once every 12 weeks. Women will only receive the once every 4 weeks injection. Talk to your pediatrician regarding the use of this medicine in children. Special care may be needed. Overdosage: If you think you have taken too much of this medicine contact a poison control center or emergency room at once. NOTE: This medicine is only for you. Do not share this medicine with others. Anastrozole tablets What is this medicine? ANASTROZOLE (an AS troe zole) is used to treat breast cancer in women who have gone  through menopause. Some types of breast cancer depend on estrogen to grow, and this medicine can stop tumor growth by blocking estrogen production. This medicine may be used for other purposes; ask your health care provider or pharmacist if you have questions. COMMON BRAND NAME(S): Arimidex What should I tell my health care provider before I take this medicine? They need to know if you have any of these conditions: -bone problems -heart disease -high cholesterol -an unusual or allergic reaction to anastrozole, other medicines, foods, dyes, or preservatives -pregnant or trying to get pregnant -breast-feeding How should I use this medicine? Take this medicine by mouth with a glass of water. Follow the directions on the prescription label. You can take it with or without food. If it upsets your stomach, take it with food. Take your medicine at regular intervals. Do not take it more often than directed. Do not stop taking except on your doctor's advice. Talk to your pediatrician regarding the use of this medicine in children. Special care may be needed. Overdosage: If you think you have taken too much of this medicine contact a poison control center or emergency room at once. NOTE: This medicine is only for you. Do not share this medicine with others. What if I miss a dose? If you miss a dose, take it as soon as you can. If it is almost time for your next dose, take only that dose. Do not take double or extra doses. What may interact with this medicine? This medicine may interact with the following medications: -female hormones, like estrogens or progestins and birth control pills, patches, rings, or injections -tamoxifen This  list may not describe all possible interactions. Give your health care provider a list of all the medicines, herbs, non-prescription drugs, or dietary supplements you use. Also tell them if you smoke, drink alcohol, or use illegal drugs. Some items may interact with your  medicine. What should I watch for while using this medicine? Visit your doctor or health care professional for regular checks on your progress. Let your doctor or health care professional know about any unusual vaginal bleeding. Do not become pregnant while taking this medicine or for at least 3 weeks after stopping it. Women should inform their doctor if they wish to become pregnant or think they might be pregnant. There is a potential for serious side effects to an unborn child. Talk to your health care professional or pharmacist for more information. Do not breast-feed an infant while taking this medicine or for 2 weeks after stopping it. This medicine may interfere with the ability to have a child. Talk with your doctor or health care professional if you are concerned about your fertility. Using this medicine for a long time may increase your risk of low bone mass. Talk to your doctor about bone health. You should make sure that you get enough calcium and vitamin D while you are taking this medicine. Discuss the foods you eat and the vitamins you take with your health care professional. What side effects may I notice from receiving this medicine? Side effects that you should report to your doctor or health care professional as soon as possible: -allergic reactions like skin rash, itching or hives, swelling of the face, lips, or tongue -signs and symptoms of a blood clot such as breathing problems; changes in vision; chest pain; sudden headache; pain, swelling, warmth in the leg; trouble speaking; sudden numbness or weakness of the face, arm, or leg -signs and symptoms of infection like fever or chills; cough; sore throat; pain or trouble passing urine Side effects that usually do not require medical attention (report to your doctor or health care professional if they continue or are bothersome): -bone pain -dizziness -hair loss -headache -hot flashes -joint pain -muscle pain -signs of  decreased red blood cells - unusually weak or tired, feeling faint or lightheaded, falls -vaginal discharge, itching, or odor in women This list may not describe all possible side effects. Call your doctor for medical advice about side effects. You may report side effects to FDA at 1-800-FDA-1088. Where should I keep my medicine? Keep out of the reach of children. Store at room temperature between 20 and 25 degrees C (68 and 77 degrees F). Throw away any unused medicine after the expiration date. NOTE: This sheet is a summary. It may not cover all possible information. If you have questions about this medicine, talk to your doctor, pharmacist, or health care provider.  2019 [employer] Standard (2017-07-16 14:56:51)

## 2018-12-06 DIAGNOSIS — J4531 Mild persistent asthma with (acute) exacerbation: Secondary | ICD-10-CM | POA: Diagnosis not present

## 2018-12-06 DIAGNOSIS — Z681 Body mass index (BMI) 19 or less, adult: Secondary | ICD-10-CM | POA: Diagnosis not present

## 2018-12-20 ENCOUNTER — Other Ambulatory Visit: Payer: Self-pay

## 2018-12-20 ENCOUNTER — Inpatient Hospital Stay: Payer: 59 | Attending: Oncology

## 2018-12-20 VITALS — BP 122/78 | HR 89 | Temp 98.7°F | Resp 17

## 2018-12-20 DIAGNOSIS — Z5111 Encounter for antineoplastic chemotherapy: Secondary | ICD-10-CM | POA: Diagnosis not present

## 2018-12-20 DIAGNOSIS — Z8051 Family history of malignant neoplasm of kidney: Secondary | ICD-10-CM | POA: Diagnosis not present

## 2018-12-20 DIAGNOSIS — J45909 Unspecified asthma, uncomplicated: Secondary | ICD-10-CM | POA: Diagnosis not present

## 2018-12-20 DIAGNOSIS — M129 Arthropathy, unspecified: Secondary | ICD-10-CM | POA: Diagnosis not present

## 2018-12-20 DIAGNOSIS — Z801 Family history of malignant neoplasm of trachea, bronchus and lung: Secondary | ICD-10-CM | POA: Insufficient documentation

## 2018-12-20 DIAGNOSIS — E669 Obesity, unspecified: Secondary | ICD-10-CM | POA: Insufficient documentation

## 2018-12-20 DIAGNOSIS — F419 Anxiety disorder, unspecified: Secondary | ICD-10-CM | POA: Diagnosis not present

## 2018-12-20 DIAGNOSIS — R232 Flushing: Secondary | ICD-10-CM | POA: Diagnosis not present

## 2018-12-20 DIAGNOSIS — Z79811 Long term (current) use of aromatase inhibitors: Secondary | ICD-10-CM | POA: Insufficient documentation

## 2018-12-20 DIAGNOSIS — Z800 Family history of malignant neoplasm of digestive organs: Secondary | ICD-10-CM | POA: Diagnosis not present

## 2018-12-20 DIAGNOSIS — K219 Gastro-esophageal reflux disease without esophagitis: Secondary | ICD-10-CM | POA: Diagnosis not present

## 2018-12-20 DIAGNOSIS — G932 Benign intracranial hypertension: Secondary | ICD-10-CM | POA: Insufficient documentation

## 2018-12-20 DIAGNOSIS — Z8052 Family history of malignant neoplasm of bladder: Secondary | ICD-10-CM | POA: Diagnosis not present

## 2018-12-20 DIAGNOSIS — Z79899 Other long term (current) drug therapy: Secondary | ICD-10-CM | POA: Diagnosis not present

## 2018-12-20 DIAGNOSIS — Z170 Estrogen receptor positive status [ER+]: Secondary | ICD-10-CM | POA: Insufficient documentation

## 2018-12-20 DIAGNOSIS — C50812 Malignant neoplasm of overlapping sites of left female breast: Secondary | ICD-10-CM

## 2018-12-20 DIAGNOSIS — C50212 Malignant neoplasm of upper-inner quadrant of left female breast: Secondary | ICD-10-CM | POA: Diagnosis not present

## 2018-12-20 MED ORDER — GOSERELIN ACETATE 3.6 MG ~~LOC~~ IMPL
DRUG_IMPLANT | SUBCUTANEOUS | Status: AC
  Filled 2018-12-20: qty 3.6

## 2018-12-20 MED ORDER — GOSERELIN ACETATE 3.6 MG ~~LOC~~ IMPL
3.6000 mg | DRUG_IMPLANT | Freq: Once | SUBCUTANEOUS | Status: AC
  Administered 2018-12-20: 3.6 mg via SUBCUTANEOUS

## 2018-12-20 NOTE — Patient Instructions (Signed)
Goserelin injection What is this medicine? GOSERELIN (GOE se rel in) is similar to a hormone found in the body. It lowers the amount of sex hormones that the body makes. Men will have lower testosterone levels and women will have lower estrogen levels while taking this medicine. In men, this medicine is used to treat prostate cancer; the injection is either given once per month or once every 12 weeks. A once per month injection (only) is used to treat women with endometriosis, dysfunctional uterine bleeding, or advanced breast cancer. This medicine may be used for other purposes; ask your health care provider or pharmacist if you have questions. COMMON BRAND NAME(S): Zoladex What should I tell my health care provider before I take this medicine? They need to know if you have any of these conditions (some only apply to women): -diabetes -heart disease or previous heart attack -high blood pressure -high cholesterol -kidney disease -osteoporosis or low bone density -problems passing urine -spinal cord injury -stroke -tobacco smoker -an unusual or allergic reaction to goserelin, hormone therapy, other medicines, foods, dyes, or preservatives -pregnant or trying to get pregnant -breast-feeding How should I use this medicine? This medicine is for injection under the skin. It is given by a health care professional in a hospital or clinic setting. Men receive this injection once every 4 weeks or once every 12 weeks. Women will only receive the once every 4 weeks injection. Talk to your pediatrician regarding the use of this medicine in children. Special care may be needed. Overdosage: If you think you have taken too much of this medicine contact a poison control center or emergency room at once. NOTE: This medicine is only for you. Do not share this medicine with others. Anastrozole tablets What is this medicine? ANASTROZOLE (an AS troe zole) is used to treat breast cancer in women who have gone  through menopause. Some types of breast cancer depend on estrogen to grow, and this medicine can stop tumor growth by blocking estrogen production. This medicine may be used for other purposes; ask your health care provider or pharmacist if you have questions. COMMON BRAND NAME(S): Arimidex What should I tell my health care provider before I take this medicine? They need to know if you have any of these conditions: -bone problems -heart disease -high cholesterol -an unusual or allergic reaction to anastrozole, other medicines, foods, dyes, or preservatives -pregnant or trying to get pregnant -breast-feeding How should I use this medicine? Take this medicine by mouth with a glass of water. Follow the directions on the prescription label. You can take it with or without food. If it upsets your stomach, take it with food. Take your medicine at regular intervals. Do not take it more often than directed. Do not stop taking except on your doctor's advice. Talk to your pediatrician regarding the use of this medicine in children. Special care may be needed. Overdosage: If you think you have taken too much of this medicine contact a poison control center or emergency room at once. NOTE: This medicine is only for you. Do not share this medicine with others. What if I miss a dose? If you miss a dose, take it as soon as you can. If it is almost time for your next dose, take only that dose. Do not take double or extra doses. What may interact with this medicine? This medicine may interact with the following medications: -female hormones, like estrogens or progestins and birth control pills, patches, rings, or injections -tamoxifen This  list may not describe all possible interactions. Give your health care provider a list of all the medicines, herbs, non-prescription drugs, or dietary supplements you use. Also tell them if you smoke, drink alcohol, or use illegal drugs. Some items may interact with your  medicine. What should I watch for while using this medicine? Visit your doctor or health care professional for regular checks on your progress. Let your doctor or health care professional know about any unusual vaginal bleeding. Do not become pregnant while taking this medicine or for at least 3 weeks after stopping it. Women should inform their doctor if they wish to become pregnant or think they might be pregnant. There is a potential for serious side effects to an unborn child. Talk to your health care professional or pharmacist for more information. Do not breast-feed an infant while taking this medicine or for 2 weeks after stopping it. This medicine may interfere with the ability to have a child. Talk with your doctor or health care professional if you are concerned about your fertility. Using this medicine for a long time may increase your risk of low bone mass. Talk to your doctor about bone health. You should make sure that you get enough calcium and vitamin D while you are taking this medicine. Discuss the foods you eat and the vitamins you take with your health care professional. What side effects may I notice from receiving this medicine? Side effects that you should report to your doctor or health care professional as soon as possible: -allergic reactions like skin rash, itching or hives, swelling of the face, lips, or tongue -signs and symptoms of a blood clot such as breathing problems; changes in vision; chest pain; sudden headache; pain, swelling, warmth in the leg; trouble speaking; sudden numbness or weakness of the face, arm, or leg -signs and symptoms of infection like fever or chills; cough; sore throat; pain or trouble passing urine Side effects that usually do not require medical attention (report to your doctor or health care professional if they continue or are bothersome): -bone pain -dizziness -hair loss -headache -hot flashes -joint pain -muscle pain -signs of  decreased red blood cells - unusually weak or tired, feeling faint or lightheaded, falls -vaginal discharge, itching, or odor in women This list may not describe all possible side effects. Call your doctor for medical advice about side effects. You may report side effects to FDA at 1-800-FDA-1088. Where should I keep my medicine? Keep out of the reach of children. Store at room temperature between 20 and 25 degrees C (68 and 77 degrees F). Throw away any unused medicine after the expiration date. NOTE: This sheet is a summary. It may not cover all possible information. If you have questions about this medicine, talk to your doctor, pharmacist, or health care provider.  2019 [employer] Standard (2017-07-16 14:56:51)

## 2018-12-24 ENCOUNTER — Ambulatory Visit: Payer: 59 | Admitting: Oncology

## 2019-01-08 ENCOUNTER — Telehealth: Payer: Self-pay | Admitting: Oncology

## 2019-01-08 NOTE — Progress Notes (Signed)
[place]  [phone] [phone]) [phone]    ID: Breanna [name] Breanna [name] DOB: 06-Mar-1981  MR#: [id number]  OFH#:[id number]  Patient Care Team: Ginger Organ as PCP - General (Physician Assistant) Danie Binder, MD as Consulting Physician (Gastroenterology) Shanterica Biehler, Virgie Dad, MD as Consulting Physician (Oncology) Eppie Gibson, MD as Attending Physician (Radiation Oncology) Fanny Skates, MD as Consulting Physician (General Surgery) Estill Dooms, NP as Nurse Practitioner (Obstetrics and Gynecology) Rockwell Germany, RN as Oncology Nurse Navigator Tressie Ellis, Paulette Blanch, RN as Oncology Nurse Navigator OTHER MD:   CHIEF COMPLAINT: Estrogen receptor positive multifocal breast cancer  CURRENT TREATMENT: Zoladex/anastrozole   INTERVAL HISTORY: Breanna [name] returns today for follow-up and treatment of her estrogen receptor positive multifocal breast cancer.  She continues on anastrozole with good tolerance. She reports hot flashes every day and at night. They don't wake her up, but she will notice she's sweaty when she wakes up. She reports some vaginal dryness, which can make intercourse uncomfortable. She is not interested in pursuing physical therapy at this time with how busy she is.  We have not yet obtained a bone density for her  She also receives goserelin, with her most recent dose on 12/20/2018. She reports bony aches and some chest tenderness.  She is having significant hot flashes as well and some vaginal dryness.  Since her last visit here, she has not undergone any additional studies. She will be due for repeat mammography in October 2020.   REVIEW OF SYSTEMS: Breanna [name] reports left arm and left breast tenderness, as well as left shoulder pain for which she uses Biofreeze. 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 A detailed review of systems was otherwise entirely negative.   HISTORY  OF CURRENT ILLNESS: From the original intake note:  Breanna [name] noted a palpable abnormality in the 4 o'clock location of the LEFT breast sometime in September 2019.  She brought it up to medical attention and underwent bilateral diagnostic mammography with tomography and left breast ultrasonography at [facility] on 04/23/2018 showing: a Suspicious mass in the 9:30 o'clock location of the LEFT breast 2 centimeters from the nipple; by ultrasound this was an irregular hypoechoic mass with spiculated margins and posterior acoustic shadowing which measures 0.5 x 0.7 x 1.1 centimeters. A second similar lesion is also suspected in the same portion of the breast but has not been completely evaluated. Recommend targeted ultrasound of the MEDIAL aspect of the LEFT breast at the time of patient's biopsy and possible second biopsy. LEFT axilla is negative for adenopathy. The areas of clinical concern in the LOWER OUTER QUADRANT of the LEFT breast in the [place] 10 centimeters from the nipple are negative by mammogram and ultrasound.  Accordingly on 05/07/2018 the patient proceeded to biopsy of the left breast areas in question. The pathology from this procedure showed ([id number]):  1. Breast, left, needle core biopsy, at 9:30 o'clock 3 cm from the nipple: invasive mammary carcinoma, grade II. Mammary carcinoma in situ.  2. Breast, left, needle core biopsy, 9:30 o'clock 2 cm from the nipple with invasive ductal carcinoma, grade I. Mammary carcinoma in situ.  E-cadherin stains are faint in the larger tumor but still positive, strong in the second tumor, and thus these are read as ductal.  Prognostic indicators significant for:  1. Estrogen receptor, 80% positive, moderate staining intensity and progesterone receptor, 80% positive, strong staining intensity. Proliferation  marker Ki67 at 10%. HER2 negative by immunohistochemistry, 1+.   2. Estrogen Receptor: 90%, positive, strong staining  intensity and progesterone Receptor: 90%, positive, strong staining intensity. Proliferation Marker Ki67: 10%. HER2 negative by immunohistochemistry, 1+.    The patient's subsequent history is as detailed below.   PAST MEDICAL HISTORY: Past Medical History:  Diagnosis Date  . Anxiety   . Arthritis    knees  . Asthma   . Breast cancer ([place]) 05/2018   left   . Cluster headaches   . Cough 06/11/2018  . Dental crown present   . Family history of bladder cancer   . Family history of kidney cancer   . Family history of lung cancer   . Family history of stomach cancer   . GERD (gastroesophageal reflux disease)   . Heart palpitations    occasional - no cardiologist  . History of asthma    as a child - prn inhaler  . History of radiation therapy 07/31/18- 09/17/18   Left Breast / 50.4 Gy in 28 fractions of 1.8 Gy, Left Supraclavicular / 50.4 Gy in 28 fractions of 1.8 Gy, and boost of 10 Gy in 5 fractions of 2 Gy each.   . Obesity   . Pseudotumor cerebri syndrome   . Seasonal allergies   . Stuffy nose 06/11/2018    PAST SURGICAL HISTORY: Past Surgical History:  Procedure Laterality Date  . BIOPSY  11/08/2015   Procedure: BIOPSY;  Surgeon: Danie Binder, MD;  [facility];  Service: Endoscopy;;  Gastric bx and Gastric polyp bx  . BREAST LUMPECTOMY WITH RADIOACTIVE SEED AND SENTINEL LYMPH NODE BIOPSY Left 06/24/2018   Procedure: LEFT BREAST LUMPECTOMY WITH  BRACKETED RADIOACTIVE SEED AND LEFT AXILLARY DEEP SENTINEL LYMPH NODE BIOPSY AND BLUE DYE INJECTION;  Surgeon: Fanny Skates, MD;  Location: [place];  Service: General;  Laterality: Left;  . CESAREAN SECTION  11/03/2003  . CESAREAN SECTION  05/15/2006  . CHOLECYSTECTOMY    . COLONOSCOPY N/A 11/08/2015   Procedure: COLONOSCOPY;  Surgeon: Danie Binder, MD;  [facility];  Service: Endoscopy;  Laterality: N/A;  1000  . ESOPHAGOGASTRODUODENOSCOPY N/A 11/08/2015   Procedure:  ESOPHAGOGASTRODUODENOSCOPY (EGD);  Surgeon: Danie Binder, MD;  [facility];  Service: Endoscopy;  Laterality: N/A;  . HEMORRHOID BANDING N/A 11/08/2015   Procedure: HEMORRHOID BANDING;  Surgeon: Danie Binder, MD;  [facility];  Service: Endoscopy;  Laterality: N/A;  . SAVORY DILATION N/A 11/08/2015   Procedure: SAVORY DILATION;  Surgeon: Danie Binder, MD;  [facility];  Service: Endoscopy;  Laterality: N/A;  . WISDOM TOOTH EXTRACTION      FAMILY HISTORY: Family History  Problem Relation Age of Onset  . COPD Mother   . Pneumonia Mother   . Hyperlipidemia Father   . Kidney cancer Father 77       "simple renal cell carcinoma"  . Heart disease Maternal Aunt   . Hypertension Maternal Grandmother   . Diabetes Maternal Grandmother   . Stroke Paternal Grandmother   . Hypertension Paternal Grandmother   . Diabetes Paternal Grandmother   . Stomach cancer Paternal Grandmother 63       had a portion of stomah removed  . Diabetes Paternal Grandfather   . Heart disease Paternal Grandfather   . Hyperlipidemia Paternal Grandfather   . Hypertension Paternal Grandfather   . Bladder Cancer Maternal Uncle        hx  smoking  . Lung cancer Other    As of November 2019 her father is 34 years old. Patients' mother died from pneumonia at age 29. The patient has 0 brothers and 0 sisters. Patient denies any family story of ovarian or breast cancer. Her paternal grandmother had gastric cancer in 2000. Her paternal aunt had lung cancer without a hx of smoking. Her maternal uncle has had bladder cancer.    GYNECOLOGIC HISTORY:  No LMP recorded. Patient has had an implant. Menarche: 38 years old Age at first live birth: 38 years old [place] P2 LMP: No Contraceptive: Implanon in place.  HRT:   Hysterectomy?: No BSO?: No   SOCIAL HISTORY: (Updated 01/09/2019) She works as a Therapist, sports at at the [employer] in [place], [place]. Her husband Letitia Libra works for the [employer] as  an Mining engineer. Her two children are 74 years old and 57 years old.  The patient attends full [employer] in [place].   ADVANCED DIRECTIVES:  n/a   HEALTH MAINTENANCE: Social History   Tobacco Use  . Smoking status: Never Smoker  . Smokeless tobacco: Never Used  Substance Use Topics  . Alcohol use: No  . Drug use: No     Colonoscopy: 2017  PAP: 2017  Bone density: No   Allergies  Allergen Reactions  . Adhesive [Tape] Other (See Comments)    BLISTERS  . Latex Hives and Itching  . Topamax [Topiramate] Other (See Comments)    ALTERED MENTAL STATUS  . Celecoxib Palpitations  . Codeine Itching and Rash  . Penicillins Itching and Rash       . Sulfa Antibiotics Itching and Rash    Current Outpatient Medications  Medication Sig Dispense Refill  . acetaZOLAMIDE (DIAMOX) 500 MG capsule Take 500 mg by mouth daily.    Marland Kitchen albuterol (PROVENTIL HFA;VENTOLIN HFA) 108 (90 Base) MCG/ACT inhaler Inhale into the lungs every 6 (six) hours as needed for wheezing or shortness of breath.    . ALPRAZolam (XANAX) 0.5 MG tablet Take 1 tablet by mouth daily as needed for anxiety.     Marland Kitchen anastrozole (ARIMIDEX) 1 MG tablet Take 1 tablet (1 mg total) by mouth daily. 90 tablet 4  . busPIRone (BUSPAR) 7.5 MG tablet Take 7.5 mg by mouth daily.     . cetirizine (ZYRTEC) 10 MG tablet Take 10 mg by mouth daily.    . Cyanocobalamin (VITAMIN B 12) 500 MCG TABS Take by mouth.    Marland Kitchen HYDROcodone-acetaminophen (NORCO) 5-325 MG tablet Take 1-2 tablets by mouth every 6 (six) hours as needed for moderate pain or severe pain. (Patient not taking: Reported on 07/24/2018) 30 tablet 0  . pantoprazole (PROTONIX) 40 MG tablet Take 40 mg by mouth daily.    Marland Kitchen zonisamide (ZONEGRAN) 25 MG capsule Take 50 mg by mouth daily.      No current facility-administered medications for this visit.     OBJECTIVE: Morbidly obese white woman who appears stated age  67:   01/09/19 0944  BP: 119/83  Pulse: 87  Resp: 20  Temp:  98.7 F (37.1 C)  SpO2: 100%     Body mass index is 42.05 kg/m.   Wt Readings from Last 3 Encounters:  01/09/19 252 lb 11.2 oz (114.6 kg)  09/18/18 262 lb 14.4 oz (119.3 kg)  07/24/18 261 lb 6.4 oz (118.6 kg)      ECOG FS:1 - Symptomatic but completely ambulatory  Sclerae unicteric, EOMs intact Wearing a mask No cervical or supraclavicular  adenopathy Lungs no rales or rhonchi Heart regular rate and rhythm Abd soft, nontender, positive bowel sounds MSK no focal spinal tenderness, no upper extremity lymphedema Neuro: nonfocal, well oriented, appropriate affect Breasts: The right breast is unremarkable.  The left breast is status post lumpectomy and radiation.  There is no evidence of local recurrence.  Both axillae are benign.   LAB RESULTS:  CMP     Component Value Date/Time   NA 138 06/17/2018 0849   K 3.5 06/17/2018 0849   CL 109 06/17/2018 0849   CO2 23 06/17/2018 0849   GLUCOSE 116 (H) 06/17/2018 0849   BUN 11 06/17/2018 0849   CREATININE 1.20 (H) 06/17/2018 0849   CREATININE 1.05 (H) 05/22/2018 1158   CREATININE 1.08 08/22/2013 1039   CALCIUM 8.6 (L) 06/17/2018 0849   PROT 7.1 06/17/2018 0849   ALBUMIN 3.8 06/17/2018 0849   AST 25 06/17/2018 0849   AST 19 05/22/2018 1158   ALT 33 06/17/2018 0849   ALT 25 05/22/2018 1158   ALKPHOS 41 06/17/2018 0849   BILITOT 0.8 06/17/2018 0849   BILITOT 0.6 05/22/2018 1158   GFRNONAA 57 (L) 06/17/2018 0849   GFRNONAA >60 05/22/2018 1158   GFRAA >60 06/17/2018 0849   GFRAA >60 05/22/2018 1158    No results found for: TOTALPROTELP, ALBUMINELP, A1GS, A2GS, BETS, BETA2SER, GAMS, MSPIKE, SPEI  No results found for: KPAFRELGTCHN, LAMBDASER, KAPLAMBRATIO  Lab Results  Component Value Date   WBC 10.1 06/17/2018   NEUTROABS 6.7 06/17/2018   HGB 14.3 06/17/2018   HCT 42.9 06/17/2018   MCV 97.3 06/17/2018   PLT 282 06/17/2018    '@LASTCHEMISTRY'$ @  No results found for: LABCA2  No components found for: [id number]  No  results for input(s): INR in the last 168 hours.  No results found for: LABCA2  No results found for: DUK025  No results found for: KYH062  No results found for: BJS283  No results found for: CA2729  No components found for: HGQUANT  No results found for: CEA1 / No results found for: CEA1   No results found for: AFPTUMOR  No results found for: CHROMOGRNA  No results found for: PSA1  No visits with results within 3 Day(s) from this visit.  Latest known visit with results is:  Hospital Outpatient Visit on 07/24/2018  Component Date Value Ref Range Status  . Preg Test, Ur 07/24/2018 NEGATIVE  NEGATIVE Final   Comment:        THE SENSITIVITY OF THIS METHODOLOGY IS >20 mIU/mL. Performed at [facility], [place] [address], [place], [place] [address]     (this displays the last labs from the last 3 days)  No results found for: TOTALPROTELP, ALBUMINELP, A1GS, A2GS, BETS, BETA2SER, GAMS, MSPIKE, SPEI (this displays SPEP labs)  No results found for: KPAFRELGTCHN, LAMBDASER, KAPLAMBRATIO (kappa/lambda light chains)  No results found for: HGBA, HGBA2QUANT, HGBFQUANT, HGBSQUAN (Hemoglobinopathy evaluation)   No results found for: LDH  No results found for: IRON, TIBC, IRONPCTSAT (Iron and TIBC)  No results found for: FERRITIN  Urinalysis No results found for: COLORURINE, APPEARANCEUR, LABSPEC, PHURINE, GLUCOSEU, HGBUR, BILIRUBINUR, KETONESUR, PROTEINUR, UROBILINOGEN, NITRITE, LEUKOCYTESUR   STUDIES:  No results found.   ELIGIBLE FOR AVAILABLE RESEARCH PROTOCOL: no   ASSESSMENT: 38 y.o. [facility] woman, status post left breast biopsy x2 on 05/07/2018, showing (a) left breast upper inner quadrant 2 cm from the nipple: a clinical T1b N0, stage IA invasive ductal carcinoma, grade 1, estrogen and progesterone receptor  strongly positive, HER-2 not amplified, with an MIB-1 of 10%; strongly E-cadherin positive (b) left breast upper inner  quadrant 3 cm from the nipple, a clinical T1c N0, stage IA invasive ductal carcinoma, grade 2, estrogen receptor positive with moderate staining intensity, progesterone receptor positive with strong staining intensity, with an MIB-1 of 10%, and no HER-2 amplification; faint but positive E-cadherin stain  (1) genetics testing 05/28/2018 through the Multi-Cancer Panel offered by [facility], no deleterious mutations in AIP, ALK, APC, ATM, AXIN2, BAP1, BARD1, BLM, BMPR1A, BRCA1, BRCA2, BRIP1, BUB1B, CASR, CDC73, CDH1, CDK4, CDKN1B, CDKN1C, CDKN2A, CEBPA, CEP57, CHEK2, CTNNA1, DICER1, DIS3L2, EGFR, ENG, EPCAM, FH, FLCN, GALNT12, GATA2, GPC3, GREM1, HOXB13, HRAS, KIT, MAX, MEN1, MET, MITF, MLH1, MLH3, MSH2, MSH3, MSH6, MUTYH, NBN, NF1, NF2, NTHL1, PALB2, PDGFRA, PHOX2B, PMS2, POLD1, POLE, POT1, PRKAR1A, PTCH1, PTEN, RAD50, RAD51C, RAD51D, RB1, RECQL4, RET, RNF43, RPS20, RUNX1, SDHA, SDHAF2, SDHB, SDHC, SDHD, SMAD4, SMARCA4, SMARCB1, SMARCE1, STK11, SUFU, TERC, TERT, TMEM127, TP53, TSC1, TSC2, VHL, WRN, WT1  (2) status post left lumpectomy and sentinel lymph node sampling 06/24/2018 for a pT2 pN1, stage IIA invasive ductal carcinoma, grade 1, with negative margins  (a) a total of 2 sentinel lymph nodes were removed  (3) MammaPrint read as low risk, with an average 10-year risk of recurrence with no treatment of 10%, the 5-year disease-free survival being 97.8% with hormone treatment alone  (4) adjuvant radiation 07/31/2018 - 09/17/2018  (a) Left Breast and IM nodes/ 50.4 Gy in 28 fractions of 1.8 Gy  (b) Left post axilla/ supraclavicular / 50.4 Gy in 28 fractions of 1.8 Gy  (c) Boost / 10 Gy in 5 fractions of 2 Gy  (5) anastrozole started 09/19/2018  (a) goserelin started 09/25/2018   PLAN: Drea is now 6 months out from definitive surgery for her breast cancer with no evidence of disease recurrence.  She is tolerating the anastrozole and goserelin without unusual side effects.  It is very bothersome to  be going through menopause in addition to all the other issues going on but in general she is tolerating the treatment moderately well and we are continuing.  She is at very high risk of having severe complications if she develops an infection with the COVID 19.  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  If there is positivity where she works it might be a good idea for her to take some time off or go on FMLA and I will be glad to write her a letter to that regard if she wishes  On the other hand the pains and discomfort she is experiencing in the left upper extremity and left breast are expected and do not mean she has cancer there  She will see [place] again in September and then again in December.  She knows to call for any other issues that may develop before then.   Bellami Farrelly, Virgie Dad, MD  01/09/19 10:15 AM Medical Oncology and Hematology [facility] [address] [place], [place] [address] Tel. [phone]    Fax. [phone]    I, Wilburn Mylar, am acting as scribe for Dr. Virgie Dad. Derrisha Foos.  I, Lurline Del MD, have reviewed the above documentation for accuracy and completeness, and I agree with the above.

## 2019-01-08 NOTE — Telephone Encounter (Signed)
Called patient regarding upcoming Web[facility] appointment, per patient's request this will be a walk-in visit. Provider approved.

## 2019-01-09 ENCOUNTER — Other Ambulatory Visit: Payer: Self-pay

## 2019-01-09 ENCOUNTER — Inpatient Hospital Stay (HOSPITAL_BASED_OUTPATIENT_CLINIC_OR_DEPARTMENT_OTHER): Payer: 59 | Admitting: Oncology

## 2019-01-09 VITALS — BP 119/83 | HR 87 | Temp 98.7°F | Resp 20 | Ht 65.0 in | Wt 252.7 lb

## 2019-01-09 DIAGNOSIS — E669 Obesity, unspecified: Secondary | ICD-10-CM

## 2019-01-09 DIAGNOSIS — Z5111 Encounter for antineoplastic chemotherapy: Secondary | ICD-10-CM | POA: Diagnosis not present

## 2019-01-09 DIAGNOSIS — Z79811 Long term (current) use of aromatase inhibitors: Secondary | ICD-10-CM

## 2019-01-09 DIAGNOSIS — Z170 Estrogen receptor positive status [ER+]: Secondary | ICD-10-CM

## 2019-01-09 DIAGNOSIS — K219 Gastro-esophageal reflux disease without esophagitis: Secondary | ICD-10-CM

## 2019-01-09 DIAGNOSIS — C50812 Malignant neoplasm of overlapping sites of left female breast: Secondary | ICD-10-CM

## 2019-01-09 DIAGNOSIS — C50212 Malignant neoplasm of upper-inner quadrant of left female breast: Secondary | ICD-10-CM | POA: Diagnosis not present

## 2019-01-09 DIAGNOSIS — M129 Arthropathy, unspecified: Secondary | ICD-10-CM

## 2019-01-09 DIAGNOSIS — Z800 Family history of malignant neoplasm of digestive organs: Secondary | ICD-10-CM

## 2019-01-09 DIAGNOSIS — J45909 Unspecified asthma, uncomplicated: Secondary | ICD-10-CM

## 2019-01-09 DIAGNOSIS — G932 Benign intracranial hypertension: Secondary | ICD-10-CM

## 2019-01-09 DIAGNOSIS — Z79899 Other long term (current) drug therapy: Secondary | ICD-10-CM

## 2019-01-09 DIAGNOSIS — F419 Anxiety disorder, unspecified: Secondary | ICD-10-CM

## 2019-01-09 DIAGNOSIS — R232 Flushing: Secondary | ICD-10-CM

## 2019-01-09 DIAGNOSIS — Z801 Family history of malignant neoplasm of trachea, bronchus and lung: Secondary | ICD-10-CM

## 2019-01-09 DIAGNOSIS — Z8052 Family history of malignant neoplasm of bladder: Secondary | ICD-10-CM

## 2019-01-09 DIAGNOSIS — Z8051 Family history of malignant neoplasm of kidney: Secondary | ICD-10-CM

## 2019-01-10 ENCOUNTER — Other Ambulatory Visit: Payer: Self-pay | Admitting: Oncology

## 2019-01-13 ENCOUNTER — Telehealth: Payer: Self-pay | Admitting: Oncology

## 2019-01-13 NOTE — Telephone Encounter (Signed)
I talk with patient regarding 12/17  Its a Thursday and she said its ok

## 2019-01-20 ENCOUNTER — Other Ambulatory Visit: Payer: Self-pay

## 2019-01-20 ENCOUNTER — Inpatient Hospital Stay: Payer: 59 | Attending: Oncology

## 2019-01-20 VITALS — BP 128/72 | HR 83 | Temp 98.7°F | Resp 18

## 2019-01-20 DIAGNOSIS — C50212 Malignant neoplasm of upper-inner quadrant of left female breast: Secondary | ICD-10-CM | POA: Diagnosis present

## 2019-01-20 DIAGNOSIS — Z170 Estrogen receptor positive status [ER+]: Secondary | ICD-10-CM | POA: Insufficient documentation

## 2019-01-20 DIAGNOSIS — Z5111 Encounter for antineoplastic chemotherapy: Secondary | ICD-10-CM | POA: Diagnosis not present

## 2019-01-20 DIAGNOSIS — C50812 Malignant neoplasm of overlapping sites of left female breast: Secondary | ICD-10-CM

## 2019-01-20 MED ORDER — GOSERELIN ACETATE 3.6 MG ~~LOC~~ IMPL
3.6000 mg | DRUG_IMPLANT | Freq: Once | SUBCUTANEOUS | Status: AC
  Administered 2019-01-20: 3.6 mg via SUBCUTANEOUS

## 2019-01-20 MED ORDER — GOSERELIN ACETATE 3.6 MG ~~LOC~~ IMPL
DRUG_IMPLANT | SUBCUTANEOUS | Status: AC
  Filled 2019-01-20: qty 3.6

## 2019-02-14 ENCOUNTER — Other Ambulatory Visit: Payer: Self-pay

## 2019-02-14 ENCOUNTER — Inpatient Hospital Stay: Payer: 59

## 2019-02-14 VITALS — BP 128/72 | HR 78 | Temp 98.2°F | Resp 18

## 2019-02-14 DIAGNOSIS — C50212 Malignant neoplasm of upper-inner quadrant of left female breast: Secondary | ICD-10-CM | POA: Diagnosis not present

## 2019-02-14 DIAGNOSIS — C50812 Malignant neoplasm of overlapping sites of left female breast: Secondary | ICD-10-CM

## 2019-02-14 DIAGNOSIS — Z170 Estrogen receptor positive status [ER+]: Secondary | ICD-10-CM

## 2019-02-14 MED ORDER — GOSERELIN ACETATE 3.6 MG ~~LOC~~ IMPL
3.6000 mg | DRUG_IMPLANT | Freq: Once | SUBCUTANEOUS | Status: AC
  Administered 2019-02-14: 15:00:00 3.6 mg via SUBCUTANEOUS

## 2019-02-14 MED ORDER — GOSERELIN ACETATE 3.6 MG ~~LOC~~ IMPL
DRUG_IMPLANT | SUBCUTANEOUS | Status: AC
  Filled 2019-02-14: qty 3.6

## 2019-02-14 NOTE — Patient Instructions (Signed)
Goserelin injection What is this medicine? GOSERELIN (GOE se rel in) is similar to a hormone found in the body. It lowers the amount of sex hormones that the body makes. Men will have lower testosterone levels and women will have lower estrogen levels while taking this medicine. In men, this medicine is used to treat prostate cancer; the injection is either given once per month or once every 12 weeks. A once per month injection (only) is used to treat women with endometriosis, dysfunctional uterine bleeding, or advanced breast cancer. This medicine may be used for other purposes; ask your health care provider or pharmacist if you have questions. COMMON BRAND NAME(S): Zoladex What should I tell my health care provider before I take this medicine? They need to know if you have any of these conditions (some only apply to women): -diabetes -heart disease or previous heart attack -high blood pressure -high cholesterol -kidney disease -osteoporosis or low bone density -problems passing urine -spinal cord injury -stroke -tobacco smoker -an unusual or allergic reaction to goserelin, hormone therapy, other medicines, foods, dyes, or preservatives -pregnant or trying to get pregnant -breast-feeding How should I use this medicine? This medicine is for injection under the skin. It is given by a health care professional in a hospital or clinic setting. Men receive this injection once every 4 weeks or once every 12 weeks. Women will only receive the once every 4 weeks injection. Talk to your pediatrician regarding the use of this medicine in children. Special care may be needed. Overdosage: If you think you have taken too much of this medicine contact a poison control center or emergency room at once. NOTE: This medicine is only for you. Do not share this medicine with others. Anastrozole tablets What is this medicine? ANASTROZOLE (an AS troe zole) is used to treat breast cancer in women who have gone  through menopause. Some types of breast cancer depend on estrogen to grow, and this medicine can stop tumor growth by blocking estrogen production. This medicine may be used for other purposes; ask your health care provider or pharmacist if you have questions. COMMON BRAND NAME(S): Arimidex What should I tell my health care provider before I take this medicine? They need to know if you have any of these conditions: -bone problems -heart disease -high cholesterol -an unusual or allergic reaction to anastrozole, other medicines, foods, dyes, or preservatives -pregnant or trying to get pregnant -breast-feeding How should I use this medicine? Take this medicine by mouth with a glass of water. Follow the directions on the prescription label. You can take it with or without food. If it upsets your stomach, take it with food. Take your medicine at regular intervals. Do not take it more often than directed. Do not stop taking except on your doctor's advice. Talk to your pediatrician regarding the use of this medicine in children. Special care may be needed. Overdosage: If you think you have taken too much of this medicine contact a poison control center or emergency room at once. NOTE: This medicine is only for you. Do not share this medicine with others. What if I miss a dose? If you miss a dose, take it as soon as you can. If it is almost time for your next dose, take only that dose. Do not take double or extra doses. What may interact with this medicine? This medicine may interact with the following medications: -female hormones, like estrogens or progestins and birth control pills, patches, rings, or injections -tamoxifen This  list may not describe all possible interactions. Give your health care provider a list of all the medicines, herbs, non-prescription drugs, or dietary supplements you use. Also tell them if you smoke, drink alcohol, or use illegal drugs. Some items may interact with your  medicine. What should I watch for while using this medicine? Visit your doctor or health care professional for regular checks on your progress. Let your doctor or health care professional know about any unusual vaginal bleeding. Do not become pregnant while taking this medicine or for at least 3 weeks after stopping it. Women should inform their doctor if they wish to become pregnant or think they might be pregnant. There is a potential for serious side effects to an unborn child. Talk to your health care professional or pharmacist for more information. Do not breast-feed an infant while taking this medicine or for 2 weeks after stopping it. This medicine may interfere with the ability to have a child. Talk with your doctor or health care professional if you are concerned about your fertility. Using this medicine for a long time may increase your risk of low bone mass. Talk to your doctor about bone health. You should make sure that you get enough calcium and vitamin D while you are taking this medicine. Discuss the foods you eat and the vitamins you take with your health care professional. What side effects may I notice from receiving this medicine? Side effects that you should report to your doctor or health care professional as soon as possible: -allergic reactions like skin rash, itching or hives, swelling of the face, lips, or tongue -signs and symptoms of a blood clot such as breathing problems; changes in vision; chest pain; sudden headache; pain, swelling, warmth in the leg; trouble speaking; sudden numbness or weakness of the face, arm, or leg -signs and symptoms of infection like fever or chills; cough; sore throat; pain or trouble passing urine Side effects that usually do not require medical attention (report to your doctor or health care professional if they continue or are bothersome): -bone pain -dizziness -hair loss -headache -hot flashes -joint pain -muscle pain -signs of  decreased red blood cells - unusually weak or tired, feeling faint or lightheaded, falls -vaginal discharge, itching, or odor in women This list may not describe all possible side effects. Call your doctor for medical advice about side effects. You may report side effects to FDA at 1-800-FDA-1088. Where should I keep my medicine? Keep out of the reach of children. Store at room temperature between 20 and 25 degrees C (68 and 77 degrees F). Throw away any unused medicine after the expiration date. NOTE: This sheet is a summary. It may not cover all possible information. If you have questions about this medicine, talk to your doctor, pharmacist, or health care provider.  2019 [employer] Standard (2017-07-16 14:56:51)

## 2019-02-25 ENCOUNTER — Other Ambulatory Visit: Payer: Self-pay

## 2019-02-25 ENCOUNTER — Inpatient Hospital Stay: Payer: 59 | Attending: Oncology | Admitting: Oncology

## 2019-02-25 ENCOUNTER — Ambulatory Visit (HOSPITAL_COMMUNITY)
Admission: RE | Admit: 2019-02-25 | Discharge: 2019-02-25 | Disposition: A | Discharge status: Home / Self Care | Payer: 59 | Source: Ambulatory Visit | Attending: Oncology | Admitting: Oncology

## 2019-02-25 ENCOUNTER — Telehealth: Payer: Self-pay | Admitting: *Deleted

## 2019-02-25 VITALS — BP 113/75 | HR 111 | Temp 98.0°F | Resp 18 | Ht 65.0 in | Wt 250.6 lb

## 2019-02-25 DIAGNOSIS — Z170 Estrogen receptor positive status [ER+]: Secondary | ICD-10-CM | POA: Insufficient documentation

## 2019-02-25 DIAGNOSIS — E669 Obesity, unspecified: Secondary | ICD-10-CM | POA: Insufficient documentation

## 2019-02-25 DIAGNOSIS — Z79811 Long term (current) use of aromatase inhibitors: Secondary | ICD-10-CM | POA: Insufficient documentation

## 2019-02-25 DIAGNOSIS — Z8051 Family history of malignant neoplasm of kidney: Secondary | ICD-10-CM | POA: Insufficient documentation

## 2019-02-25 DIAGNOSIS — R002 Palpitations: Secondary | ICD-10-CM | POA: Diagnosis not present

## 2019-02-25 DIAGNOSIS — Z5111 Encounter for antineoplastic chemotherapy: Secondary | ICD-10-CM | POA: Diagnosis not present

## 2019-02-25 DIAGNOSIS — R197 Diarrhea, unspecified: Secondary | ICD-10-CM | POA: Diagnosis not present

## 2019-02-25 DIAGNOSIS — R55 Syncope and collapse: Secondary | ICD-10-CM | POA: Diagnosis not present

## 2019-02-25 DIAGNOSIS — G932 Benign intracranial hypertension: Secondary | ICD-10-CM | POA: Diagnosis not present

## 2019-02-25 DIAGNOSIS — Z79899 Other long term (current) drug therapy: Secondary | ICD-10-CM | POA: Insufficient documentation

## 2019-02-25 DIAGNOSIS — Z9181 History of falling: Secondary | ICD-10-CM | POA: Diagnosis not present

## 2019-02-25 DIAGNOSIS — K219 Gastro-esophageal reflux disease without esophagitis: Secondary | ICD-10-CM | POA: Diagnosis not present

## 2019-02-25 DIAGNOSIS — R630 Anorexia: Secondary | ICD-10-CM | POA: Diagnosis not present

## 2019-02-25 DIAGNOSIS — C50212 Malignant neoplasm of upper-inner quadrant of left female breast: Secondary | ICD-10-CM | POA: Insufficient documentation

## 2019-02-25 DIAGNOSIS — C50812 Malignant neoplasm of overlapping sites of left female breast: Secondary | ICD-10-CM

## 2019-02-25 DIAGNOSIS — R232 Flushing: Secondary | ICD-10-CM | POA: Insufficient documentation

## 2019-02-25 DIAGNOSIS — Z800 Family history of malignant neoplasm of digestive organs: Secondary | ICD-10-CM | POA: Diagnosis not present

## 2019-02-25 DIAGNOSIS — F419 Anxiety disorder, unspecified: Secondary | ICD-10-CM | POA: Diagnosis not present

## 2019-02-25 DIAGNOSIS — M129 Arthropathy, unspecified: Secondary | ICD-10-CM | POA: Insufficient documentation

## 2019-02-25 DIAGNOSIS — Z801 Family history of malignant neoplasm of trachea, bronchus and lung: Secondary | ICD-10-CM | POA: Diagnosis not present

## 2019-02-25 NOTE — Progress Notes (Signed)
[facility]  Telephone:([phone]) [phone] [phone]) [phone]    ID: Breanna [name] DOB: [birth year]  MR#: [id number]  HKV#:[id number]  Patient Care Team: Ginger Organ as PCP - General (Physician Assistant) Danie Binder, MD as Consulting Physician (Gastroenterology) Adil Tugwell, Virgie Dad, MD as Consulting Physician (Oncology) Eppie Gibson, MD as Attending Physician (Radiation Oncology) Fanny Skates, MD as Consulting Physician (General Surgery) Estill Dooms, NP as Nurse Practitioner (Obstetrics and Gynecology) Rockwell Germany, RN as Oncology Nurse Navigator Tressie Ellis, Paulette Blanch, RN as Oncology Nurse Navigator OTHER MD:   CHIEF COMPLAINT: Estrogen receptor positive multifocal breast cancer  CURRENT TREATMENT: goserelin/Zoladex; anastrozole   INTERVAL HISTORY: Breanna [name] returns today for follow-up and treatment of her estrogen receptor positive multifocal breast cancer.  She contacted our office today with a new breast concern. Per telephone note with our nurse, Breanna [name] reported noticing on 02/17/2019 a pulling sensation under her left breast into her ribs, as well as a painful, marble-sized lump, while taking a shower. She can feel it when she leans, and sometimes the pain runs down her side. She denies any trauma to the breast.  She continues on anastrozole with good tolerance. We have not yet obtained a bone density for her.  She also receives goserelin, with her most recent dose on 02/14/2019. She reports bony aches and some chest tenderness.  She is having significant hot flashes as well and some vaginal dryness.  Since her last visit here, she has not undergone any additional studies. She will be due for repeat mammography in October 2020.   REVIEW OF SYSTEMS: Breanna [name] reports she has had anxiety issues with a lot going on. This causes diarrhea and loss of appetite. She notes the diarrhea has occurred for the last week. She has been working a lot of  hours at her job because they are short staffed.  They get tested every 2 weeks for the coronavirus but they do not get the results for 10 days.  She has a vacation coming next week, and she will go to the beach. A detailed review of systems was otherwise stable.   HISTORY OF CURRENT ILLNESS: From the original intake note:  Breanna [name] noted a palpable abnormality in the 4 o'clock location of the LEFT breast sometime in September 2019.  She brought it up to medical attention and underwent bilateral diagnostic mammography with tomography and left breast ultrasonography at [facility] on 04/23/2018 showing: a Suspicious mass in the 9:30 o'clock location of the LEFT breast 2 centimeters from the nipple; by ultrasound this was an irregular hypoechoic mass with spiculated margins and posterior acoustic shadowing which measures 0.5 x 0.7 x 1.1 centimeters. A second similar lesion is also suspected in the same portion of the breast but has not been completely evaluated. Recommend targeted ultrasound of the MEDIAL aspect of the LEFT breast at the time of patient's biopsy and possible second biopsy. LEFT axilla is negative for adenopathy. The areas of clinical concern in the LOWER OUTER QUADRANT of the LEFT breast in the [place] 10 centimeters from the nipple are negative by mammogram and ultrasound.  Accordingly on 05/07/2018 the patient proceeded to biopsy of the left breast areas in question. The pathology from this procedure showed ([id number]):  1. Breast, left, needle core biopsy, at 9:30 o'clock 3 cm from the nipple: invasive mammary carcinoma, grade II. Mammary carcinoma in situ.  2. Breast, left, needle core biopsy, 9:30 o'clock 2 cm from  the nipple with invasive ductal carcinoma, grade I. Mammary carcinoma in situ.  E-cadherin stains are faint in the larger tumor but still positive, strong in the second tumor, and thus these are read as ductal.  Prognostic indicators  significant for:  1. Estrogen receptor, 80% positive, moderate staining intensity and progesterone receptor, 80% positive, strong staining intensity. Proliferation marker Ki67 at 10%. HER2 negative by immunohistochemistry, 1+.   2. Estrogen Receptor: 90%, positive, strong staining intensity and progesterone Receptor: 90%, positive, strong staining intensity. Proliferation Marker Ki67: 10%. HER2 negative by immunohistochemistry, 1+.    The patient's subsequent history is as detailed below.   PAST MEDICAL HISTORY: Past Medical History:  Diagnosis Date   Anxiety    Arthritis    knees   Asthma    Breast cancer ([facility]) 05/2018   left    Cluster headaches    Cough 06/11/2018   Dental crown present    Family history of bladder cancer    Family history of kidney cancer    Family history of lung cancer    Family history of stomach cancer    GERD (gastroesophageal reflux disease)    Heart palpitations    occasional - no cardiologist   History of asthma    as a child - prn inhaler   History of radiation therapy 07/31/18- 09/17/18   Left Breast / 50.4 Gy in 28 fractions of 1.8 Gy, Left Supraclavicular / 50.4 Gy in 28 fractions of 1.8 Gy, and boost of 10 Gy in 5 fractions of 2 Gy each.    Obesity    Pseudotumor cerebri syndrome    Seasonal allergies    Stuffy nose 06/11/2018    PAST SURGICAL HISTORY: Past Surgical History:  Procedure Laterality Date   BIOPSY  11/08/2015   Procedure: BIOPSY;  Surgeon: Danie Binder, MD;  [facility];  Service: Endoscopy;;  Gastric bx and Gastric polyp bx   BREAST LUMPECTOMY WITH RADIOACTIVE SEED AND SENTINEL LYMPH NODE BIOPSY Left 06/24/2018   Procedure: LEFT BREAST LUMPECTOMY WITH  BRACKETED RADIOACTIVE SEED AND LEFT AXILLARY DEEP SENTINEL LYMPH NODE BIOPSY AND BLUE DYE INJECTION;  Surgeon: Fanny Skates, MD;  Location: [facility];  Service: General;  Laterality: Left;   CESAREAN SECTION  11/03/2003    CESAREAN SECTION  05/15/2006   CHOLECYSTECTOMY     COLONOSCOPY N/A 11/08/2015   Procedure: COLONOSCOPY;  Surgeon: Danie Binder, MD;  [facility];  Service: Endoscopy;  Laterality: N/A;  1000   ESOPHAGOGASTRODUODENOSCOPY N/A 11/08/2015   Procedure: ESOPHAGOGASTRODUODENOSCOPY (EGD);  Surgeon: Danie Binder, MD;  [facility];  Service: Endoscopy;  Laterality: N/A;   HEMORRHOID BANDING N/A 11/08/2015   Procedure: HEMORRHOID BANDING;  Surgeon: Danie Binder, MD;  [facility];  Service: Endoscopy;  Laterality: N/A;   SAVORY DILATION N/A 11/08/2015   Procedure: SAVORY DILATION;  Surgeon: Danie Binder, MD;  [facility];  Service: Endoscopy;  Laterality: N/A;   WISDOM TOOTH EXTRACTION      FAMILY HISTORY: Family History  Problem Relation Age of Onset   COPD Mother    Pneumonia Mother    Hyperlipidemia Father    Kidney cancer Father 65       "simple renal cell carcinoma"   Heart disease Maternal Aunt    Hypertension Maternal Grandmother    Diabetes Maternal Grandmother    Stroke Paternal Grandmother    Hypertension Paternal Grandmother    Diabetes Paternal Grandmother  Stomach cancer Paternal Grandmother 25       had a portion of stomah removed   Diabetes Paternal Grandfather    Heart disease Paternal Grandfather    Hyperlipidemia Paternal Grandfather    Hypertension Paternal Grandfather    Bladder Cancer Maternal Uncle        hx smoking   Lung cancer Other    As of November 2019 her father is 34 years old. Patients' mother died from pneumonia at age 55. The patient has 0 brothers and 0 sisters. Patient denies any family story of ovarian or breast cancer. Her paternal grandmother had gastric cancer in 2000. Her paternal aunt had lung cancer without a hx of smoking. Her maternal uncle has had bladder cancer.    GYNECOLOGIC HISTORY:  No LMP recorded. Patient has had an implant. Menarche: 38 years old Age at  first live birth: 38 years old [place] P2 LMP: No Contraceptive: Implanon in place.  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 Her husband Letitia Libra works for the [employer] as an Mining engineer. Her two children are 29 years old and 76 years old.  The patient attends full [employer] in [place].   ADVANCED DIRECTIVES:  n/a   HEALTH MAINTENANCE: Social History   Tobacco Use   Smoking status: Never Smoker   Smokeless tobacco: Never Used  Substance Use Topics   Alcohol use: No   Drug use: No     Colonoscopy: 2017  PAP: 2017  Bone density: No   Allergies  Allergen Reactions   Adhesive [Tape] Other (See Comments)    BLISTERS   Latex Hives and Itching   Topamax [Topiramate] Other (See Comments)    ALTERED MENTAL STATUS   Celecoxib Palpitations   Codeine Itching and Rash   Penicillins Itching and Rash        Sulfa Antibiotics Itching and Rash    Current Outpatient Medications  Medication Sig Dispense Refill   acetaZOLAMIDE (DIAMOX) 500 MG capsule Take 500 mg by mouth daily.     albuterol (PROVENTIL HFA;VENTOLIN HFA) 108 (90 Base) MCG/ACT inhaler Inhale into the lungs every 6 (six) hours as needed for wheezing or shortness of breath.     ALPRAZolam (XANAX) 0.5 MG tablet Take 1 tablet by mouth daily as needed for anxiety.      anastrozole (ARIMIDEX) 1 MG tablet Take 1 tablet (1 mg total) by mouth daily. 90 tablet 4   busPIRone (BUSPAR) 7.5 MG tablet Take 7.5 mg by mouth daily.      cetirizine (ZYRTEC) 10 MG tablet Take 10 mg by mouth daily.     Cyanocobalamin (VITAMIN B 12) 500 MCG TABS Take by mouth.     HYDROcodone-acetaminophen (NORCO) 5-325 MG tablet Take 1-2 tablets by mouth every 6 (six) hours as needed for moderate pain or severe pain. (Patient not taking: Reported on 07/24/2018) 30 tablet 0   pantoprazole (PROTONIX) 40 MG tablet Take 40 mg by mouth daily.      zonisamide (ZONEGRAN) 25 MG capsule Take 50 mg by mouth daily.      No current facility-administered medications for this visit.     OBJECTIVE: Morbidly obese white woman who appears stated age  38:   02/25/19 1539  BP: 113/75  Pulse: (!) 111  Resp: 18  Temp: 98 F (36.7 C)  SpO2: 100%     Body mass index is 41.7  kg/m.   Wt Readings from Last 3 Encounters:  02/25/19 250 lb 9.6 oz (113.7 kg)  01/09/19 252 lb 11.2 oz (114.6 kg)  09/18/18 262 lb 14.4 oz (119.3 kg)      ECOG FS:1 - Symptomatic but completely ambulatory  Sclerae unicteric, EOMs intact Wearing a mask No cervical or supraclavicular adenopathy Lungs no rales or rhonchi Heart regular rate and rhythm Abd soft, obese, nontender, positive bowel sounds MSK no focal spinal tenderness, no upper extremity lymphedema Neuro: nonfocal, well oriented, appropriate affect Breasts: The right breast is unremarkable.  The left breast is status post lumpectomy and radiation.  It is smaller and darker than the right breast.  I do not palpate any suspicious mass in the left breast or left axilla.  In the lateral left rib cage at approximately the sixth or seventh rib there is an area of focal tenderness.  There is no palpable mass.   LAB RESULTS:  CMP     Component Value Date/Time   NA 138 06/17/2018 0849   K 3.5 06/17/2018 0849   CL 109 06/17/2018 0849   CO2 23 06/17/2018 0849   GLUCOSE 116 (H) 06/17/2018 0849   BUN 11 06/17/2018 0849   CREATININE 1.20 (H) 06/17/2018 0849   CREATININE 1.05 (H) 05/22/2018 1158   CREATININE 1.08 08/22/2013 1039   CALCIUM 8.6 (L) 06/17/2018 0849   PROT 7.1 06/17/2018 0849   ALBUMIN 3.8 06/17/2018 0849   AST 25 06/17/2018 0849   AST 19 05/22/2018 1158   ALT 33 06/17/2018 0849   ALT 25 05/22/2018 1158   ALKPHOS 41 06/17/2018 0849   BILITOT 0.8 06/17/2018 0849   BILITOT 0.6 05/22/2018 1158   GFRNONAA 57 (L) 06/17/2018 0849   GFRNONAA >60 05/22/2018 1158   GFRAA >60 06/17/2018 0849    GFRAA >60 05/22/2018 1158    No results found for: TOTALPROTELP, ALBUMINELP, A1GS, A2GS, BETS, BETA2SER, GAMS, MSPIKE, SPEI  No results found for: KPAFRELGTCHN, LAMBDASER, KAPLAMBRATIO  Lab Results  Component Value Date   WBC 10.1 06/17/2018   NEUTROABS 6.7 06/17/2018   HGB 14.3 06/17/2018   HCT 42.9 06/17/2018   MCV 97.3 06/17/2018   PLT 282 06/17/2018    _0 @  No results found for: LABCA2  No components found for: [id number]  No results for input(s): INR in the last 168 hours.  No results found for: LABCA2  No results found for: YTK160  No results found for: FUX323  No results found for: FTD322  No results found for: CA2729  No components found for: HGQUANT  No results found for: CEA1 / No results found for: CEA1   No results found for: AFPTUMOR  No results found for: CHROMOGRNA  No results found for: PSA1  No visits with results within 3 Day(s) from this visit.  Latest known visit with results is:  Hospital Outpatient Visit on 07/24/2018  Component Date Value Ref Range Status   Preg Test, Ur 07/24/2018 NEGATIVE  NEGATIVE Final   Comment:        THE SENSITIVITY OF THIS METHODOLOGY IS >20 mIU/mL. Performed at [facility], [place] [address], [place], [place] [address]     (this displays the last labs from the last 3 days)  No results found for: TOTALPROTELP, ALBUMINELP, A1GS, A2GS, BETS, BETA2SER, GAMS, MSPIKE, SPEI (this displays SPEP labs)  No results found for: KPAFRELGTCHN, LAMBDASER, KAPLAMBRATIO (kappa/lambda light chains)  No results found for: HGBA, HGBA2QUANT, HGBFQUANT, HGBSQUAN (Hemoglobinopathy evaluation)   No results  found for: LDH  No results found for: IRON, TIBC, IRONPCTSAT (Iron and TIBC)  No results found for: FERRITIN  Urinalysis No results found for: COLORURINE, APPEARANCEUR, LABSPEC, PHURINE, GLUCOSEU, HGBUR, BILIRUBINUR, KETONESUR, PROTEINUR, UROBILINOGEN, NITRITE, LEUKOCYTESUR   STUDIES:    No results found.   ELIGIBLE FOR AVAILABLE RESEARCH PROTOCOL: no   ASSESSMENT: 38 y.o. [facility] woman, status post left breast biopsy x2 on 05/07/2018, showing (a) left breast upper inner quadrant 2 cm from the nipple: a clinical T1b N0, stage IA invasive ductal carcinoma, grade 1, estrogen and progesterone receptor strongly positive, HER-2 not amplified, with an MIB-1 of 10%; strongly E-cadherin positive (b) left breast upper inner quadrant 3 cm from the nipple, a clinical T1c N0, stage IA invasive ductal carcinoma, grade 2, estrogen receptor positive with moderate staining intensity, progesterone receptor positive with strong staining intensity, with an MIB-1 of 10%, and no HER-2 amplification; faint but positive E-cadherin stain  (1) genetics testing 05/28/2018 through the Multi-Cancer Panel offered by [facility] showed no deleterious mutations in AIP, ALK, APC, ATM, AXIN2, BAP1, BARD1, BLM, BMPR1A, BRCA1, BRCA2, BRIP1, BUB1B, CASR, CDC73, CDH1, CDK4, CDKN1B, CDKN1C, CDKN2A, CEBPA, CEP57, CHEK2, CTNNA1, DICER1, DIS3L2, EGFR, ENG, EPCAM, FH, FLCN, GALNT12, GATA2, GPC3, GREM1, HOXB13, HRAS, KIT, MAX, MEN1, MET, MITF, MLH1, MLH3, MSH2, MSH3, MSH6, MUTYH, NBN, NF1, NF2, NTHL1, PALB2, PDGFRA, PHOX2B, PMS2, POLD1, POLE, POT1, PRKAR1A, PTCH1, PTEN, RAD50, RAD51C, RAD51D, RB1, RECQL4, RET, RNF43, RPS20, RUNX1, SDHA, SDHAF2, SDHB, SDHC, SDHD, SMAD4, SMARCA4, SMARCB1, SMARCE1, STK11, SUFU, TERC, TERT, TMEM127, TP53, TSC1, TSC2, VHL, WRN, WT1  (2) status post left lumpectomy and sentinel lymph node sampling 06/24/2018 for a pT2 pN1, stage IIA invasive ductal carcinoma, grade 1, with negative margins  (a) a total of 2 sentinel lymph nodes were removed  (3) MammaPrint read as low risk, with an average 10-year risk of recurrence with no treatment of 10%, the 5-year disease-free survival being 97.8% with hormone treatment alone  (4) adjuvant radiation 07/31/2018 - 09/17/2018  (a) Left Breast  and IM nodes/ 50.4 Gy in 28 fractions of 1.8 Gy  (b) Left post axilla/ supraclavicular / 50.4 Gy in 28 fractions of 1.8 Gy  (c) Boost / 10 Gy in 5 fractions of 2 Gy  (5) anastrozole started 09/19/2018  (a) goserelin started 09/25/2018   PLAN: Ashlinn is now 8 months out from definitive surgery for her breast cancer.  She is having discomfort in the lateral rib cage area where she possibly injured herself in a fall a few weeks ago or possibly without pay much attention injured that area as part of her work.  Rarely there is rib damage from radiation and a course of radiation did involve that area  I think the best thing to do is to obtain plain films and we are doing that today.  I reassured her that pain in the breast and pain in the axilla postop is very common, it relates to scar tissue and does not relate to recurrent breast cancer.  She is under a great deal of stress at present mostly because of her job.  She is coming back later this month for her next Zoladex injection and she will drop in 1 second just to let me know how she is doing.  In September she will have her first survivorship visit and possibly we can start working on some of her menopausal symptoms at that time  She knows to call for any other issue that may develop before the  next visit.  Magrinat, Virgie Dad, MD  02/25/19 4:13 PM Medical Oncology and Hematology [facility] [address] [place], [place] [address] Tel. [phone]    Fax. [phone]    I, Wilburn Mylar, am acting as scribe for Dr. Virgie Dad. Magrinat.  I, Lurline Del MD, have reviewed the above documentation for accuracy and completeness, and I agree with the above.

## 2019-02-25 NOTE — Telephone Encounter (Signed)
Received call from pt stating that she wants to make an appt with Dr Jana Hakim.  She reports that on 02/17/19 she was showering & when she raised her arms she felt a pulling sensation under her L breast & into ribs.  She also felt a marble size lump that is painful. She has been taking tylenol, ibuprofen, & using aspercream.  She reports that this is nagging & bothering her.  Informed message would be sent to Dr Jana Hakim.

## 2019-02-26 ENCOUNTER — Other Ambulatory Visit: Payer: Self-pay | Admitting: Oncology

## 2019-02-26 ENCOUNTER — Telehealth: Payer: Self-pay

## 2019-02-26 ENCOUNTER — Encounter: Payer: Self-pay | Admitting: Oncology

## 2019-02-26 NOTE — Telephone Encounter (Signed)
RN returned call to patient, patient inquiring about x-ray results.  RN notified of results.  RN encouraged use of Tylenol, rest, and OTC anti-inflammatory creams/lotions.  Pt voiced understanding.    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  Pt voiced that last night she was notified that many of her patient's have tested positive, and she does direct care.  Pt is asymptomatic.  Pt is worried about her risk and asking for recommendations.  RN reviewed with MD.  MD verbalized agreement to provide letter to work with given medical situation.  Pt voiced appreciation.  Letter will be mailed to confirmed address.

## 2019-03-07 ENCOUNTER — Telehealth: Payer: Self-pay | Admitting: Adult Health

## 2019-03-07 NOTE — Telephone Encounter (Signed)
I talk with patient regarding video visit °

## 2019-03-14 ENCOUNTER — Inpatient Hospital Stay: Payer: 59

## 2019-03-14 ENCOUNTER — Encounter: Payer: Self-pay | Admitting: *Deleted

## 2019-03-14 ENCOUNTER — Other Ambulatory Visit: Payer: Self-pay

## 2019-03-14 VITALS — BP 106/64 | HR 74 | Temp 98.7°F | Resp 20

## 2019-03-14 DIAGNOSIS — Z170 Estrogen receptor positive status [ER+]: Secondary | ICD-10-CM

## 2019-03-14 DIAGNOSIS — C50812 Malignant neoplasm of overlapping sites of left female breast: Secondary | ICD-10-CM

## 2019-03-14 DIAGNOSIS — C50212 Malignant neoplasm of upper-inner quadrant of left female breast: Secondary | ICD-10-CM | POA: Diagnosis not present

## 2019-03-14 MED ORDER — GOSERELIN ACETATE 3.6 MG ~~LOC~~ IMPL
3.6000 mg | DRUG_IMPLANT | Freq: Once | SUBCUTANEOUS | Status: AC
  Administered 2019-03-14: 3.6 mg via SUBCUTANEOUS

## 2019-03-14 MED ORDER — GOSERELIN ACETATE 3.6 MG ~~LOC~~ IMPL
DRUG_IMPLANT | SUBCUTANEOUS | Status: AC
  Filled 2019-03-14: qty 3.6

## 2019-03-14 NOTE — Progress Notes (Signed)
[place] Work  Patient dropped in to [place] office today requesting financial assistance.  Mrs. 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  CSW provided information for COVID relief program through [place] and provided her with one-time [employer] card.  Gwinda Maine, LCSW  Clinical Social Worker [facility]

## 2019-03-14 NOTE — Patient Instructions (Signed)

## 2019-03-20 ENCOUNTER — Telehealth: Payer: Self-pay

## 2019-03-20 NOTE — Telephone Encounter (Signed)
Pt called inquiring about FMLA papers.  This RN confirmed with Irwin Brakeman, LPN that completed documents had been faxed on 03/18/19 and originals mailed to pt on 03/18/19

## 2019-03-28 ENCOUNTER — Other Ambulatory Visit: Payer: Self-pay

## 2019-03-28 ENCOUNTER — Inpatient Hospital Stay: Payer: 59 | Attending: Oncology | Admitting: Adult Health

## 2019-03-28 DIAGNOSIS — Z5111 Encounter for antineoplastic chemotherapy: Secondary | ICD-10-CM | POA: Insufficient documentation

## 2019-03-28 DIAGNOSIS — Z170 Estrogen receptor positive status [ER+]: Secondary | ICD-10-CM | POA: Insufficient documentation

## 2019-03-28 DIAGNOSIS — C50812 Malignant neoplasm of overlapping sites of left female breast: Secondary | ICD-10-CM | POA: Insufficient documentation

## 2019-03-28 NOTE — Progress Notes (Signed)
Patient requested reschedule due to technical issues contributing to delay.   Wilber Bihari, NP

## 2019-04-01 ENCOUNTER — Telehealth: Payer: Self-pay | Admitting: Adult Health

## 2019-04-01 NOTE — Telephone Encounter (Signed)
I talk with patient regarding reschedule °

## 2019-04-10 ENCOUNTER — Telehealth: Payer: Self-pay | Admitting: Adult Health

## 2019-04-10 NOTE — Telephone Encounter (Signed)
I talk with patient regarding change of time to 430 on 9/29

## 2019-04-11 ENCOUNTER — Inpatient Hospital Stay: Payer: 59

## 2019-04-11 ENCOUNTER — Other Ambulatory Visit: Payer: Self-pay

## 2019-04-11 VITALS — BP 118/62 | HR 72 | Temp 98.5°F | Resp 18

## 2019-04-11 DIAGNOSIS — Z5111 Encounter for antineoplastic chemotherapy: Secondary | ICD-10-CM | POA: Diagnosis not present

## 2019-04-11 DIAGNOSIS — C50812 Malignant neoplasm of overlapping sites of left female breast: Secondary | ICD-10-CM | POA: Diagnosis present

## 2019-04-11 DIAGNOSIS — Z170 Estrogen receptor positive status [ER+]: Secondary | ICD-10-CM

## 2019-04-11 MED ORDER — GOSERELIN ACETATE 3.6 MG ~~LOC~~ IMPL
3.6000 mg | DRUG_IMPLANT | Freq: Once | SUBCUTANEOUS | Status: AC
  Administered 2019-04-11: 3.6 mg via SUBCUTANEOUS

## 2019-04-11 MED ORDER — GOSERELIN ACETATE 3.6 MG ~~LOC~~ IMPL
DRUG_IMPLANT | SUBCUTANEOUS | Status: AC
  Filled 2019-04-11: qty 3.6

## 2019-04-14 ENCOUNTER — Telehealth: Payer: Self-pay | Admitting: Oncology

## 2019-04-15 ENCOUNTER — Inpatient Hospital Stay (HOSPITAL_BASED_OUTPATIENT_CLINIC_OR_DEPARTMENT_OTHER): Payer: 59 | Admitting: Adult Health

## 2019-04-15 DIAGNOSIS — Z170 Estrogen receptor positive status [ER+]: Secondary | ICD-10-CM | POA: Diagnosis not present

## 2019-04-15 DIAGNOSIS — C50812 Malignant neoplasm of overlapping sites of left female breast: Secondary | ICD-10-CM

## 2019-04-15 NOTE — Progress Notes (Signed)
SURVIVORSHIP VIRTUAL VISIT:  I connected with Danyela Sones on 04/15/19 at  4:30 PM EDT by my chart video and verified that I am speaking with the correct person using two identifiers.  I discussed the limitations, risks, security and privacy concerns of performing an evaluation and management service virtually and the availability of in person appointments. I also discussed with the patient that there may be a patient responsible charge related to this service. The patient expressed understanding and agreed to proceed.   BRIEF ONCOLOGIC HISTORY:  Oncology History  Malignant neoplasm of overlapping sites of left breast in female, estrogen receptor positive ([place])  05/07/2018 Initial Diagnosis    [place] woman, status post left breast biopsy x2 on 05/07/2018, showing (a) left breast upper inner quadrant 2 cm from the nipple: a clinical T1b N0, stage IA invasive ductal carcinoma, grade 1, estrogen and progesterone receptor strongly positive, HER-2 not amplified, with an MIB-1 of 10%; strongly E-cadherin positive (b) left breast upper inner quadrant 3 cm from the nipple, a clinical T1c N0, stage IA invasive ductal carcinoma, grade 2, estrogen receptor positive with moderate staining intensity, progesterone receptor positive with strong staining intensity, with an MIB-1 of 10%, and no HER-2 amplification; faint but positive E-cadherin stain   05/27/2018 Cancer Staging   Staging form: Breast, AJCC 8th Edition - Clinical: Stage IA (cT1c, cN0, cM0, G2, ER+, PR+, HER2-) - Signed by Eppie Gibson, MD on 05/27/2018   05/28/2018 Genetic Testing   through the Multi-Cancer Panel offered by [facility] showed no deleterious mutations in AIP, ALK, APC, ATM, AXIN2, BAP1, BARD1, BLM, BMPR1A, BRCA1, BRCA2, BRIP1, BUB1B, CASR, CDC73, CDH1, CDK4, CDKN1B, CDKN1C, CDKN2A, CEBPA, CEP57, CHEK2, CTNNA1, DICER1, DIS3L2, EGFR, ENG, EPCAM, FH, FLCN, GALNT12, GATA2, GPC3, GREM1, HOXB13, HRAS, KIT, MAX, MEN1, MET,  MITF, MLH1, MLH3, MSH2, MSH3, MSH6, MUTYH, NBN, NF1, NF2, NTHL1, PALB2, PDGFRA, PHOX2B, PMS2, POLD1, POLE, POT1, PRKAR1A, PTCH1, PTEN, RAD50, RAD51C, RAD51D, RB1, RECQL4, RET, RNF43, RPS20, RUNX1, SDHA, SDHAF2, SDHB, SDHC, SDHD, SMAD4, SMARCA4, SMARCB1, SMARCE1, STK11, SUFU, TERC, TERT, TMEM127, TP53, TSC1, TSC2, VHL, WRN, WT1     06/24/2018 Surgery   status post left lumpectomy and sentinel lymph node sampling for a pT2 pN1, stage IIA invasive ductal carcinoma, grade 1, with negative margins             (a) a total of 2 sentinel lymph nodes were removed   06/24/2018 Miscellaneous   MammaPrint read as low risk, with an average 10-year risk of recurrence with no treatment of 10%, the 5-year disease-free survival being 97.8% with hormone treatment alone   07/24/2018 Cancer Staging   Staging form: Breast, AJCC 8th Edition - Pathologic: Stage IA (pT2, pN1a, cM0, G1, ER+, PR+, HER2-) - Signed by Eppie Gibson, MD on 07/24/2018   07/31/2018 - 09/17/2018 Radiation Therapy   adjuvant radiation 07/31/2018 - 09/17/2018             (a) Left Breast and IM nodes/ 50.4 Gy in 28 fractions of 1.8 Gy             (b) Left post axilla/ supraclavicular / 50.4 Gy in 28 fractions of 1.8 Gy             (c) Boost / 10 Gy in 5 fractions of 2 Gy     09/2018 -  Anti-estrogen oral therapy   Anastrozole and Goserelin     INTERVAL HISTORY:  Ms. Wisnewski to review her survivorship care plan detailing her treatment course  for breast cancer, as well as monitoring long-term side effects of that treatment, education regarding health maintenance, screening, and overall wellness and health promotion.     Overall, Ms. Horwitz reports feeling moderately well.  She is taking the Anastrozole daily.  She does have some hot flashes worse at night.  She also has joint aches worse in her knees.  She takes advil.  She works at the [employer] center at [employer], however was recommended to stay out of work by Dr. 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  She is working with her disability specialist and her STD runs out on 04/18/2019 and she needs another note.    REVIEW OF SYSTEMS:  Review of Systems  Constitutional: Negative for appetite change, chills, fatigue, fever and unexpected weight change.  HENT:   Negative for hearing loss, lump/mass and sore throat.   Eyes: Negative for eye problems and icterus.  Respiratory: Negative for chest tightness, cough and shortness of breath.   Cardiovascular: Negative for chest pain, leg swelling and palpitations.  Gastrointestinal: Negative for abdominal distention, abdominal pain, constipation, diarrhea, nausea and vomiting.  Endocrine: Negative for hot flashes.  Musculoskeletal: Negative for arthralgias.  Skin: Negative for itching and rash.  Neurological: Negative for dizziness, extremity weakness, headaches and numbness.  Hematological: Negative for adenopathy. Does not bruise/bleed easily.  Psychiatric/Behavioral: Negative for depression. The patient is not nervous/anxious.   Breast: Denies any new nodularity, masses, tenderness, nipple changes, or nipple discharge.    ONCOLOGY TREATMENT TEAM:  1. Surgeon:  Dr. Dalbert Batman at [facility] 2. Medical Oncologist: Dr. Jana Hakim  3. Radiation Oncologist: Dr. Isidore Moos    PAST MEDICAL/SURGICAL HISTORY:  Past Medical History:  Diagnosis Date  . Anxiety   . Arthritis    knees  . Asthma   . Breast cancer ([place]) 05/2018   left   . Cluster headaches   . Cough 06/11/2018  . Dental crown present   . Family history of bladder cancer   . Family history of kidney cancer   . Family history of lung cancer   . Family history of stomach cancer   . GERD (gastroesophageal reflux disease)   . Heart palpitations    occasional - no cardiologist  . History of asthma    as a child - prn inhaler  . History of radiation therapy 07/31/18- 09/17/18   Left Breast / 50.4  Gy in 28 fractions of 1.8 Gy, Left Supraclavicular / 50.4 Gy in 28 fractions of 1.8 Gy, and boost of 10 Gy in 5 fractions of 2 Gy each.   . Obesity   . Pseudotumor cerebri syndrome   . Seasonal allergies   . Stuffy nose 06/11/2018   Past Surgical History:  Procedure Laterality Date  . BIOPSY  11/08/2015   Procedure: BIOPSY;  Surgeon: Danie Binder, MD;  [facility];  Service: Endoscopy;;  Gastric bx and Gastric polyp bx  . BREAST LUMPECTOMY WITH RADIOACTIVE SEED AND SENTINEL LYMPH NODE BIOPSY Left 06/24/2018   Procedure: LEFT BREAST LUMPECTOMY WITH  BRACKETED RADIOACTIVE SEED AND LEFT AXILLARY DEEP SENTINEL LYMPH NODE BIOPSY AND BLUE DYE INJECTION;  Surgeon: Fanny Skates, MD;  Location: [facility];  Service: General;  Laterality: Left;  . CESAREAN SECTION  11/03/2003  . CESAREAN SECTION  05/15/2006  . CHOLECYSTECTOMY    . COLONOSCOPY N/A 11/08/2015   Procedure: COLONOSCOPY;  Surgeon:  Danie Binder, MD;  [facility];  Service: Endoscopy;  Laterality: N/A;  1000  . ESOPHAGOGASTRODUODENOSCOPY N/A 11/08/2015   Procedure: ESOPHAGOGASTRODUODENOSCOPY (EGD);  Surgeon: Danie Binder, MD;  [facility];  Service: Endoscopy;  Laterality: N/A;  . HEMORRHOID BANDING N/A 11/08/2015   Procedure: HEMORRHOID BANDING;  Surgeon: Danie Binder, MD;  [facility];  Service: Endoscopy;  Laterality: N/A;  . SAVORY DILATION N/A 11/08/2015   Procedure: SAVORY DILATION;  Surgeon: Danie Binder, MD;  [facility];  Service: Endoscopy;  Laterality: N/A;  . WISDOM TOOTH EXTRACTION       ALLERGIES:  Allergies  Allergen Reactions  . Adhesive [Tape] Other (See Comments)    BLISTERS  . Latex Hives and Itching  . Topamax [Topiramate] Other (See Comments)    ALTERED MENTAL STATUS  . Celecoxib Palpitations  . Codeine Itching and Rash  . Penicillins Itching and Rash       . Sulfa Antibiotics Itching and Rash     CURRENT MEDICATIONS:   Outpatient Encounter Medications as of 04/15/2019  Medication Sig  . acetaZOLAMIDE (DIAMOX) 500 MG capsule Take 500 mg by mouth daily.  Marland Kitchen albuterol (PROVENTIL HFA;VENTOLIN HFA) 108 (90 Base) MCG/ACT inhaler Inhale into the lungs every 6 (six) hours as needed for wheezing or shortness of breath.  . ALPRAZolam (XANAX) 0.5 MG tablet Take 1 tablet by mouth daily as needed for anxiety.   Marland Kitchen anastrozole (ARIMIDEX) 1 MG tablet Take 1 tablet (1 mg total) by mouth daily.  . busPIRone (BUSPAR) 7.5 MG tablet Take 7.5 mg by mouth daily.   . cetirizine (ZYRTEC) 10 MG tablet Take 10 mg by mouth daily.  . Cyanocobalamin (VITAMIN B 12) 500 MCG TABS Take by mouth.  Marland Kitchen HYDROcodone-acetaminophen (NORCO) 5-325 MG tablet Take 1-2 tablets by mouth every 6 (six) hours as needed for moderate pain or severe pain. (Patient not taking: Reported on 07/24/2018)  . pantoprazole (PROTONIX) 40 MG tablet Take 40 mg by mouth daily.  Marland Kitchen zonisamide (ZONEGRAN) 25 MG capsule Take 50 mg by mouth daily.    No facility-administered encounter medications on file as of 04/15/2019.      ONCOLOGIC FAMILY HISTORY:  Family History  Problem Relation Age of Onset  . COPD Mother   . Pneumonia Mother   . Hyperlipidemia Father   . Kidney cancer Father 38       "simple renal cell carcinoma"  . Heart disease Maternal Aunt   . Hypertension Maternal Grandmother   . Diabetes Maternal Grandmother   . Stroke Paternal Grandmother   . Hypertension Paternal Grandmother   . Diabetes Paternal Grandmother   . Stomach cancer Paternal Grandmother 62       had a portion of stomah removed  . Diabetes Paternal Grandfather   . Heart disease Paternal Grandfather   . Hyperlipidemia Paternal Grandfather   . Hypertension Paternal Grandfather   . Bladder Cancer Maternal Uncle        hx smoking  . Lung cancer Other      GENETIC COUNSELING/TESTING: See above  SOCIAL HISTORY:  Social History   Socioeconomic History  . Marital status: Married     Spouse name: Donnie  . Number of children: 2  . Years of education: 20  . Highest education level: Not on file  Occupational History    Comment: LPN, [employer]  . Financial resource strain: Not on file  . Food insecurity  Worry: Not on file    Inability: Not on file  . Transportation needs    Medical: No    Non-medical: No  Tobacco Use  . Smoking status: Never Smoker  . Smokeless tobacco: Never Used  Substance and Sexual Activity  . Alcohol use: No  . Drug use: No  . Sexual activity: Yes    Birth control/protection: Implant  Lifestyle  . Physical activity    Days per week: Not on file    Minutes per session: Not on file  . Stress: Not on file  Relationships  . Social Herbalist on phone: Not on file    Gets together: Not on file    Attends religious service: Not on file    Active member of club or organization: Not on file    Attends meetings of clubs or organizations: Not on file    Relationship status: Not on file  . Intimate partner violence    Fear of current or ex partner: No    Emotionally abused: No    Physically abused: No    Forced sexual activity: No  Other Topics Concern  . Not on file  Social History Narrative  . Not on file     OBSERVATIONS/OBJECTIVE:  Patient appears well.  In no apparent distress.  Mood and behavior are normal.  Breathing non labored.  Skin visualized without rash or lesion.    LABORATORY DATA:  None for this visit.  DIAGNOSTIC IMAGING:  None for this visit.      ASSESSMENT AND PLAN:  Ms.. Moncure is a pleasant 37 y.o. female with Stage IA left breast invasive ductal carcinoma, ER+/PR+/HER2-, diagnosed in 04/2018, treated with lumpectomy, adjuvant radiation therapy, and anti-estrogen therapy with Anastrozole beginning in 09/2018.  She presents to the Survivorship Clinic for our initial meeting and routine follow-up post-completion of treatment for breast cancer.    1. Stage IA left breast  cancer:  Ms. Rinks is continuing to recover from definitive treatment for breast cancer. She will follow-up with her medical oncologist, Dr. Jana Hakim in 06/2019 with history and physical exam per surveillance protocol.  She will continue her anti-estrogen therapy with Anastrozole. Thus far, she is tolerating the Anastrozole moderately well, with minimal side effects. Her mammogram is scheduled on 05/19/2019. Today, a comprehensive survivorship care plan and treatment summary was reviewed with the patient today detailing her breast cancer diagnosis, treatment course, potential late/long-term effects of treatment, appropriate follow-up care with recommendations for the future, and patient education resources.  A copy of this summary, along with a letter will be sent to the patient's primary care provider via mail/fax/In Basket message after today's visit.    2. Knee pain: She will continue tof/u with PCP about this.  We talked about weight loss, yoga, and tai chi which can help.    3. Hot flashes: I reviewed with her common triggers to her hot flashes and ways to perhaps alleviate them.  She says she may see if skipping some of the spicier food she likes to eat will help.    4. Bone health:  Given Ms. Dombkowski's age/history of breast cancer and her current treatment regimen including anti-estrogen therapy with Anastrozole, she is at risk for bone demineralization.  She is scheduled for bone density testing on 05/19/2019.  She was given education on specific activities to promote bone health.  5. Cancer screening:  Due to Ms. Morgano's history and her age, she should receive screening for skin cancers,  colon cancer, and gynecologic cancers.  The information and recommendations are listed on the patient's comprehensive care plan/treatment summary and were reviewed in detail with the patient.    6. Health maintenance and wellness promotion: Ms. Cuervo was encouraged to consume 5-7 servings of fruits and vegetables  per day. We reviewed the "Nutrition Rainbow" handout, as well as the handout "Take Control of Your Health and Reduce Your Cancer Risk" from the [place].  She was also encouraged to engage in moderate to vigorous exercise for 30 minutes per day most days of the week. We discussed the [employer] fitness program, which is designed for cancer survivors to help them become more physically fit after cancer treatments.  She was instructed to limit her alcohol consumption and continue to abstain from tobacco use.     7. Support services/counseling: It is not uncommon for this period of the patient's cancer care trajectory to be one of many emotions and stressors.  We discussed how this can be increasingly difficult during the times of quarantine and social distancing due to the COVID-19 pandemic.   She was given information regarding our available services and encouraged to contact me with any questions or for help enrolling in any of our support group/programs.    Follow up instructions:    -Return to cancer center 06/2019   -Mammogram due in 05/2019 -Follow up with surgery per Dr. Dalbert Batman -She is welcome to return back to the Survivorship Clinic at any time; no additional follow-up needed at this time.  -Consider referral back to survivorship as a long-term survivor for continued surveillance  The patient was provided an opportunity to ask questions and all were answered. The patient agreed with the plan and demonstrated an understanding of the instructions.   The patient was advised to call back or seek an in-person evaluation if the symptoms worsen or if the condition fails to improve as anticipated.   I provided 50 minutes of face-to-face video visit time during this encounter, and > 50% was spent counseling as documented under my assessment & plan.  Scot Dock, NP

## 2019-04-16 ENCOUNTER — Encounter: Payer: Self-pay | Admitting: Adult Health

## 2019-04-18 IMAGING — MG NEEDLE LOCALIZATION OF THE LEFT BREAST WITH MAMMO GUIDANCE
4 series · 4 of 4 positions shown · non-contrast
Comparison: Previous exam(s).

CLINICAL DATA: Patient for preoperative localization

EXAM:
MAMMOGRAPHIC GUIDED RADIOACTIVE SEED LOCALIZATION OF THE LEFT BREAST

[L CC (1 of 3)]
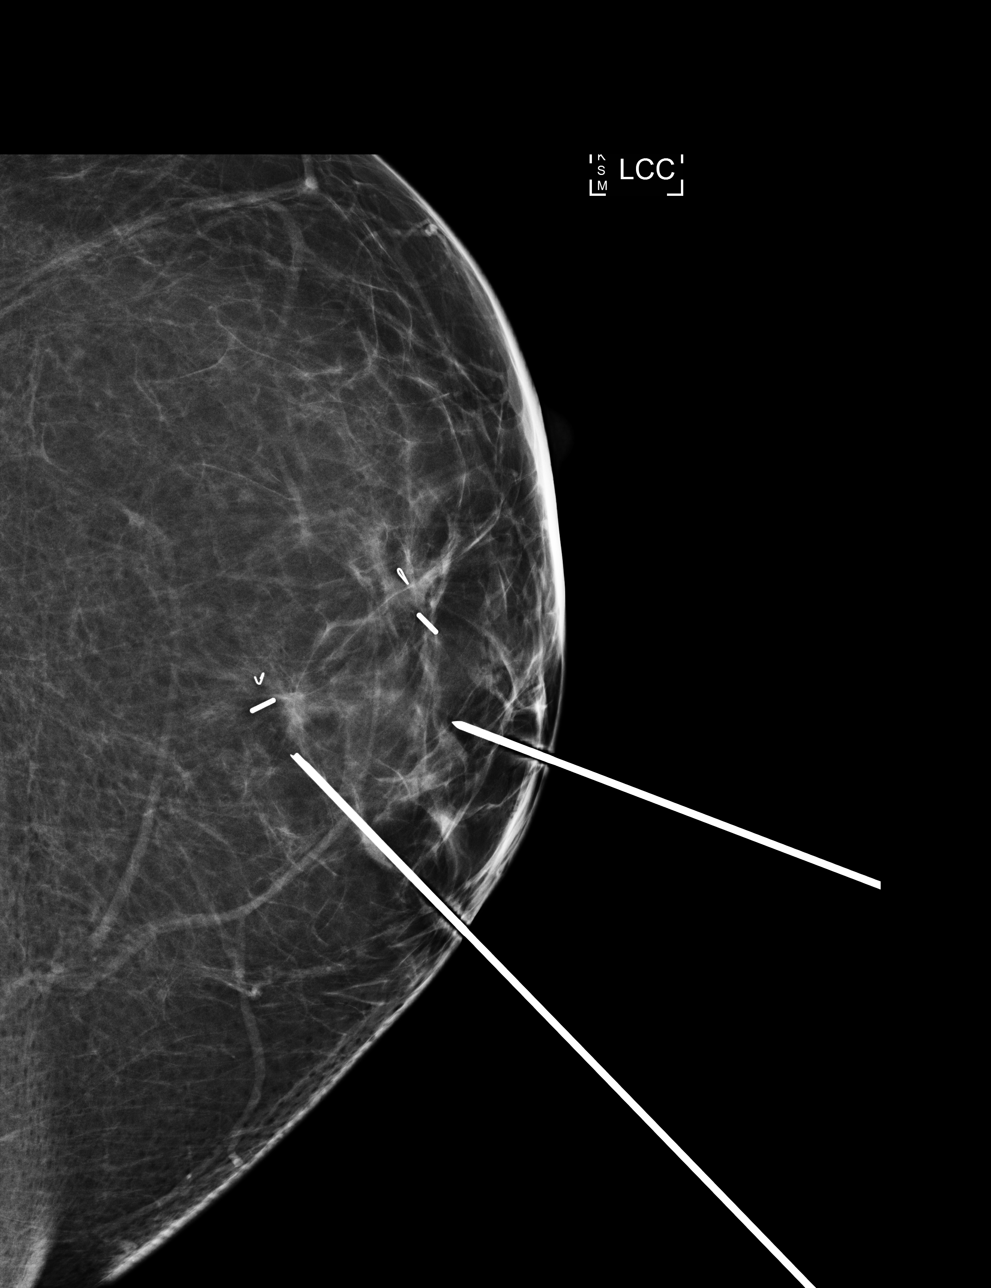

[L CC (2 of 3)]
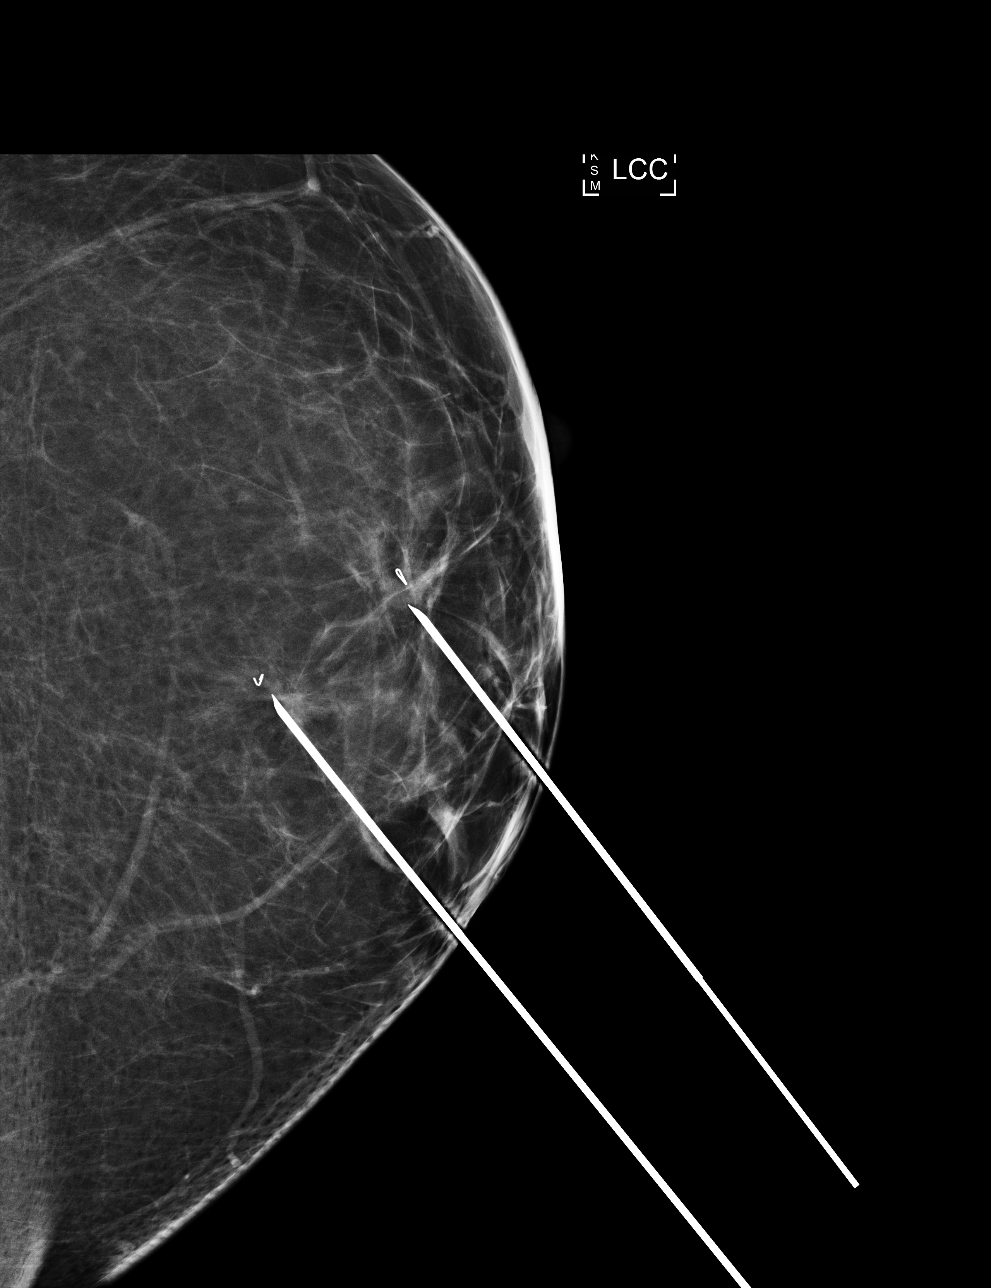

[L CC (3 of 3)]
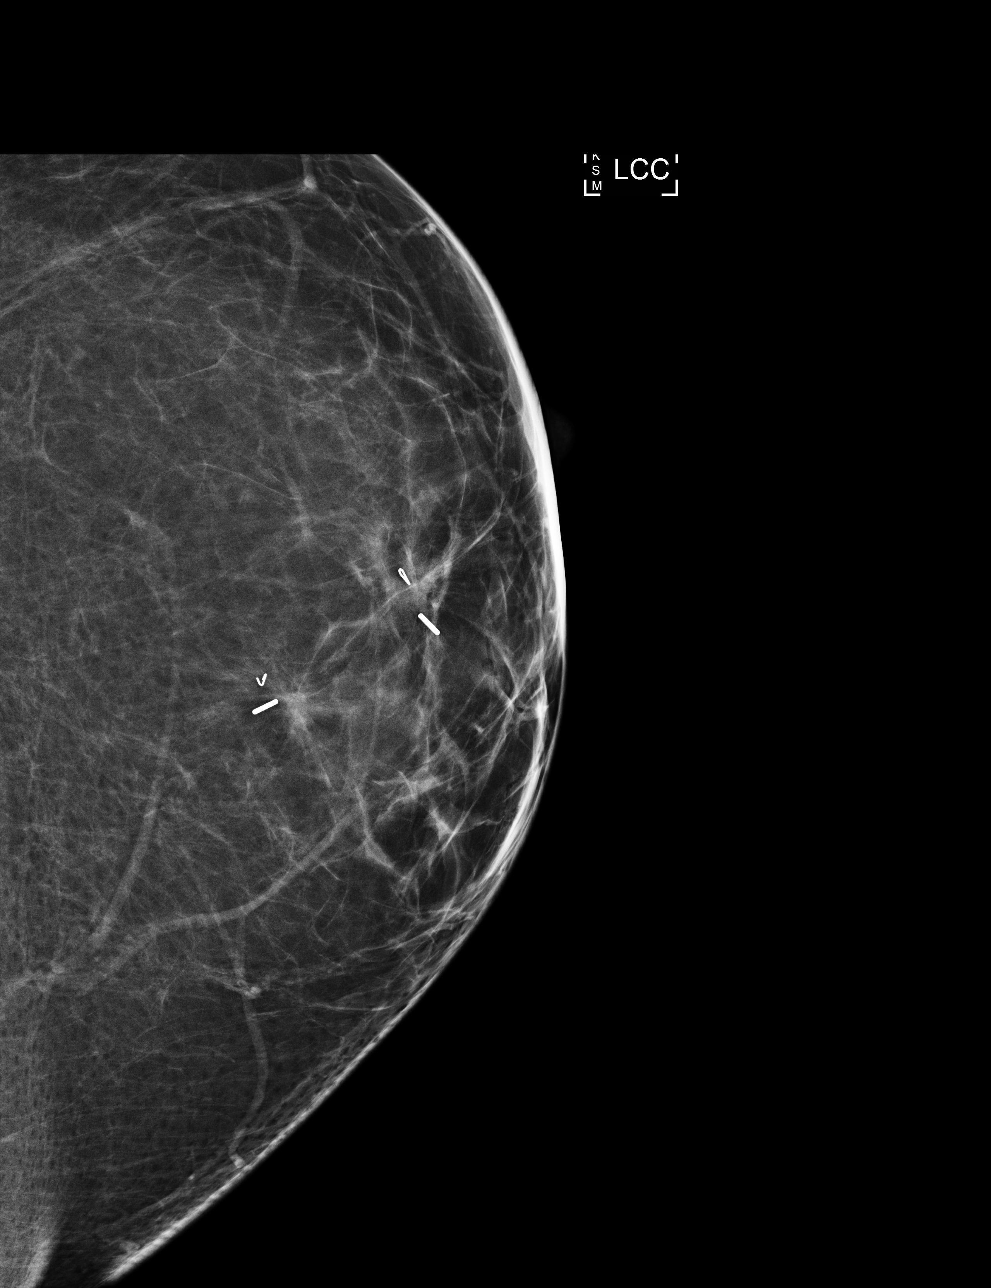

[L ML]
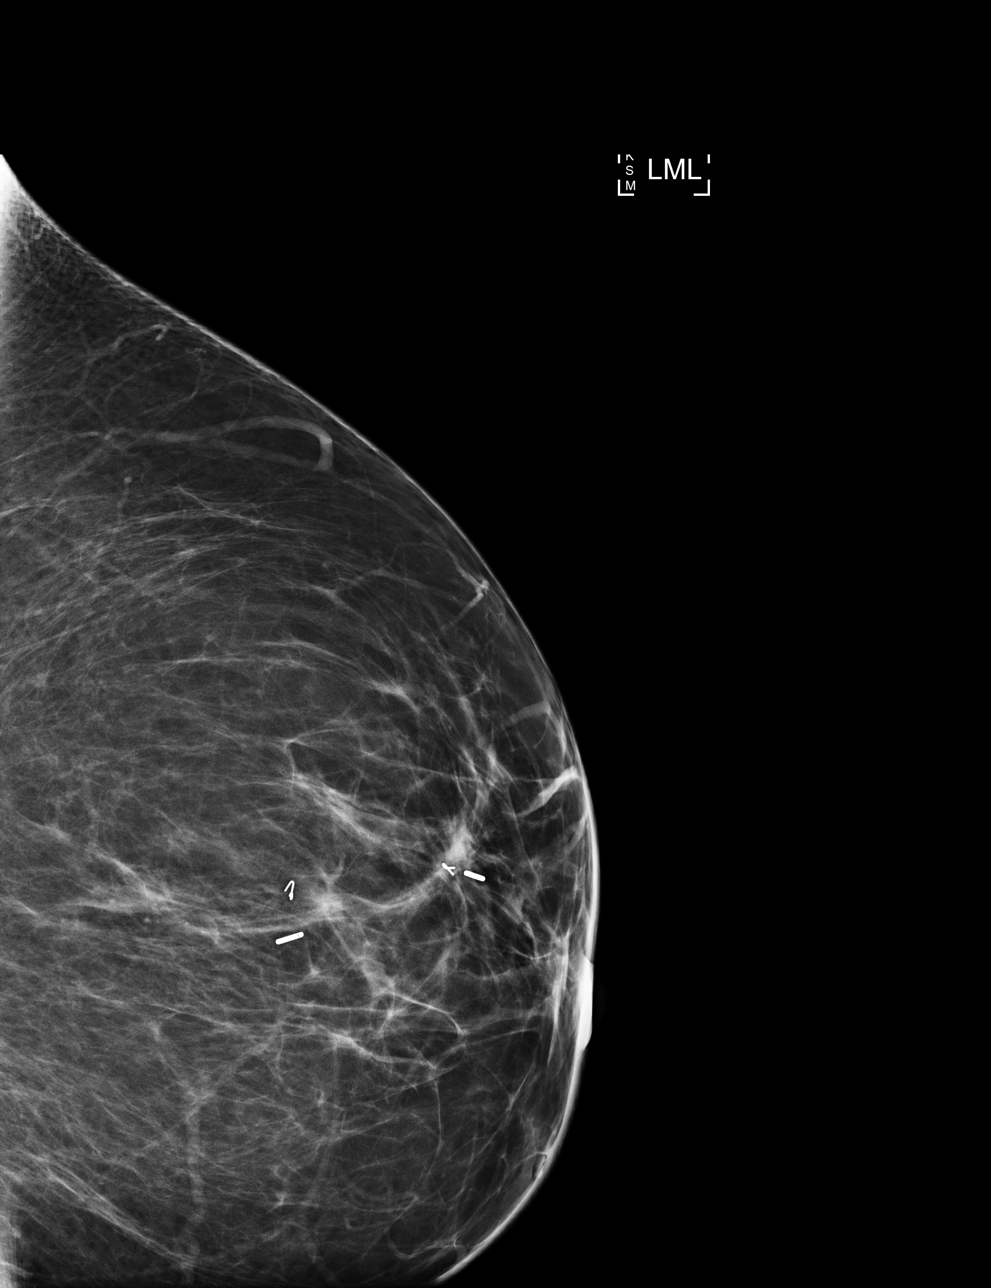

[4 of 4 positions shown; findings below may reference images not displayed]

FINDINGS: Patient presents for radioactive seed localization prior to left
breast lumpectomy. I met with the patient and we discussed the
procedure of seed localization including benefits and alternatives.
We discussed the high likelihood of a successful procedure. We
discussed the risks of the procedure including infection, bleeding,
tissue injury and further surgery. We discussed the low dose of
radioactivity involved in the procedure. Informed, written consent
was given.

The usual time-out protocol was performed immediately prior to the
procedure.

Site 1: (Ribbon shaped marking clip).

Using mammographic guidance, sterile technique, 1% lidocaine and an
4-CMU radioactive seed, ribbon shaped marking clip was localized
using a medial approach. The follow-up mammogram images confirm the
seed in the expected location and were marked for Dr. Joao Lemos.

Follow-up survey of the patient confirms presence of the radioactive
seed.

Order number of 4-CMU seed:  [id number].

Total activity:  0.253 millicuries reference Date: 06/07/2018

Site 2: (Wing shaped marking clip).

Using mammographic guidance, sterile technique, 1% lidocaine and an
4-CMU radioactive seed, wing shaped marking clip was localized using
a medial approach. The follow-up mammogram images confirm the seed
in the expected location and were marked for Dr. Joao Lemos.

Follow-up survey of the patient confirms presence of the radioactive
seed.

Order number of 4-CMU seed:  [id number].

Total activity:  0.251 millicuries reference Date: 06/06/2018

The patient tolerated the procedure well and was released from the
[REDACTED]. She was given instructions regarding seed removal.
IMPRESSION: Radioactive seed localization left breast, 2 sites as above. No
apparent complications.

## 2019-04-28 ENCOUNTER — Encounter: Payer: Self-pay | Admitting: Oncology

## 2019-05-09 ENCOUNTER — Other Ambulatory Visit: Payer: Self-pay

## 2019-05-09 ENCOUNTER — Inpatient Hospital Stay: Payer: 59 | Attending: Oncology

## 2019-05-09 VITALS — BP 120/74 | HR 81 | Temp 98.7°F | Resp 18

## 2019-05-09 DIAGNOSIS — C50812 Malignant neoplasm of overlapping sites of left female breast: Secondary | ICD-10-CM

## 2019-05-09 DIAGNOSIS — Z170 Estrogen receptor positive status [ER+]: Secondary | ICD-10-CM | POA: Diagnosis not present

## 2019-05-09 DIAGNOSIS — Z5111 Encounter for antineoplastic chemotherapy: Secondary | ICD-10-CM | POA: Diagnosis not present

## 2019-05-09 MED ORDER — GOSERELIN ACETATE 3.6 MG ~~LOC~~ IMPL
3.6000 mg | DRUG_IMPLANT | Freq: Once | SUBCUTANEOUS | Status: AC
  Administered 2019-05-09: 3.6 mg via SUBCUTANEOUS

## 2019-05-09 MED ORDER — GOSERELIN ACETATE 3.6 MG ~~LOC~~ IMPL
DRUG_IMPLANT | SUBCUTANEOUS | Status: AC
  Filled 2019-05-09: qty 3.6

## 2019-05-09 NOTE — Patient Instructions (Signed)

## 2019-05-19 ENCOUNTER — Other Ambulatory Visit: Payer: Self-pay

## 2019-05-19 ENCOUNTER — Ambulatory Visit
Admission: RE | Admit: 2019-05-19 | Discharge: 2019-05-19 | Disposition: A | Discharge status: Home / Self Care | Payer: 59 | Source: Ambulatory Visit | Attending: Oncology | Admitting: Oncology

## 2019-05-19 DIAGNOSIS — Z170 Estrogen receptor positive status [ER+]: Secondary | ICD-10-CM

## 2019-05-19 DIAGNOSIS — C50812 Malignant neoplasm of overlapping sites of left female breast: Secondary | ICD-10-CM

## 2019-06-06 ENCOUNTER — Other Ambulatory Visit: Payer: Self-pay

## 2019-06-06 ENCOUNTER — Inpatient Hospital Stay: Payer: 59 | Attending: Oncology

## 2019-06-06 VITALS — BP 128/72 | HR 82 | Temp 98.7°F | Resp 18

## 2019-06-06 DIAGNOSIS — C50812 Malignant neoplasm of overlapping sites of left female breast: Secondary | ICD-10-CM | POA: Insufficient documentation

## 2019-06-06 DIAGNOSIS — Z5111 Encounter for antineoplastic chemotherapy: Secondary | ICD-10-CM | POA: Insufficient documentation

## 2019-06-06 DIAGNOSIS — Z170 Estrogen receptor positive status [ER+]: Secondary | ICD-10-CM | POA: Insufficient documentation

## 2019-06-06 MED ORDER — GOSERELIN ACETATE 3.6 MG ~~LOC~~ IMPL
3.6000 mg | DRUG_IMPLANT | Freq: Once | SUBCUTANEOUS | Status: AC
  Administered 2019-06-06: 3.6 mg via SUBCUTANEOUS

## 2019-06-06 NOTE — Patient Instructions (Signed)

## 2019-06-19 ENCOUNTER — Encounter: Payer: Self-pay | Admitting: Oncology

## 2019-06-20 ENCOUNTER — Telehealth: Payer: Self-pay | Admitting: *Deleted

## 2019-06-20 NOTE — Telephone Encounter (Signed)
Patient notified of fax confirmation of records sent to [facility].   Provided emotional support for expressed concern of first Covid-19 patients  testing positive again and staff members in work area positive despite despite wearing protective equipment is concerning.

## 2019-06-20 NOTE — Telephone Encounter (Signed)
"  Breanna [name] with [employer] said they did not receive record information or the letter for me to be out of work.  He needs my injection treatment plan, schedule and ongoing monthly medical records to determine if I should be out of work.  Aaron Edelman says COVID is not a disability.  I won't see Dr. Jana Hakim until 07/03/2019 and my claim ran out 06/16/2019."

## 2019-07-02 NOTE — Progress Notes (Signed)
Breanna [name]  [phone] Fax:([phone]) [phone]    ID: Breanna [name] DOB: October 16, 1980  MR#: [id number]  HRC#:[id number]  Patient Care Team: Ginger Organ as PCP - General (Physician Assistant) Danie Binder, MD as Consulting Physician (Gastroenterology) Trinady Milewski, Virgie Dad, MD as Consulting Physician (Oncology) Eppie Gibson, MD as Attending Physician (Radiation Oncology) Fanny Skates, MD as Consulting Physician (General Surgery) Estill Dooms, NP as Nurse Practitioner (Obstetrics and Gynecology) OTHER MD:   CHIEF COMPLAINT: Estrogen receptor positive multifocal breast cancer  CURRENT TREATMENT: goserelin/Zoladex; anastrozole   INTERVAL HISTORY: Breanna [name] returns today for follow-up and treatment of her estrogen receptor positive multifocal breast cancer.  She continues on anastrozole.  She has significant hot flashes particularly at night which keep her from sleeping.  She also has significant vaginal dryness issues.  They are using some mineral oil for that.  In addition she gets arthralgias and myalgias.  These interfere with her activities of daily living.   She had a bone density on 05/19/2019 showing a Z score of 1.4, which is expected for age.  She also receives goserelin every 4 weeks.  She will receive a dose today.  She tolerates that well but of course it intensifies the menopausal symptoms  Breanna [name] was last seen on 02/25/2019 for a pulling sensation under her left breast into her ribs, as well as a painful, marble-sized lump, while taking a shower. She underwent left rib x-ray that day, which showed: no evidence of rib fracture, destructive lesion, or other acute chest wall finding in the region of concern.  Since her last visit, she underwent bilateral diagnostic mammography with tomography at [facility] on 05/19/2019 showing: breast density category B; no evidence of malignancy in either breast.  REVIEW OF  SYSTEMS: Breanna [name] continues to be very concerned about going back to work at the [employer].  They are accepting Covid patients even in the regular area.  They have had 35 deaths so far she says.  Of course no one yet has started receiving vaccines.  Her husband was found to have cirrhosis secondary to fatty liver.  He is currently on FMLA.  The children fortunately are doing well in school even though it is virtual.  A detailed review of systems was otherwise stable  HISTORY OF CURRENT ILLNESS: From the original intake note:  Breanna [name] noted a palpable abnormality in the 4 o'clock location of the LEFT breast sometime in September 2019.  She brought it up to medical attention and underwent bilateral diagnostic mammography with tomography and left breast ultrasonography at [facility] on 04/23/2018 showing: a Suspicious mass in the 9:30 o'clock location of the LEFT breast 2 centimeters from the nipple; by ultrasound this was an irregular hypoechoic mass with spiculated margins and posterior acoustic shadowing which measures 0.5 x 0.7 x 1.1 centimeters. A second similar lesion is also suspected in the same portion of the breast but has not been completely evaluated. Recommend targeted ultrasound of the MEDIAL aspect of the LEFT breast at the time of patient's biopsy and possible second biopsy. LEFT axilla is negative for adenopathy. The areas of clinical concern in the LOWER OUTER QUADRANT of the LEFT breast in the [place] 10 centimeters from the nipple are negative by mammogram and ultrasound.  Accordingly on 05/07/2018 the patient proceeded to biopsy of the left breast areas in question. The pathology from this procedure showed ([id number]):  1. Breast, left, needle core biopsy, at 9:30  o'clock 3 cm from the nipple: invasive mammary carcinoma, grade II. Mammary carcinoma in situ.  2. Breast, left, needle core biopsy, 9:30 o'clock 2 cm from the nipple with invasive ductal  carcinoma, grade I. Mammary carcinoma in situ.  E-cadherin stains are faint in the larger tumor but still positive, strong in the second tumor, and thus these are read as ductal.  Prognostic indicators significant for:  1. Estrogen receptor, 80% positive, moderate staining intensity and progesterone receptor, 80% positive, strong staining intensity. Proliferation marker Ki67 at 10%. HER2 negative by immunohistochemistry, 1+.   2. Estrogen Receptor: 90%, positive, strong staining intensity and progesterone Receptor: 90%, positive, strong staining intensity. Proliferation Marker Ki67: 10%. HER2 negative by immunohistochemistry, 1+.    The patient's subsequent history is as detailed below.   PAST MEDICAL HISTORY: Past Medical History:  Diagnosis Date  . Anxiety   . Arthritis    knees  . Asthma   . Breast cancer ([place]) 05/2018   left   . Cluster headaches   . Cough 06/11/2018  . Dental crown present   . Family history of bladder cancer   . Family history of kidney cancer   . Family history of lung cancer   . Family history of stomach cancer   . GERD (gastroesophageal reflux disease)   . Heart palpitations    occasional - no cardiologist  . History of asthma    as a child - prn inhaler  . History of radiation therapy 07/31/18- 09/17/18   Left Breast / 50.4 Gy in 28 fractions of 1.8 Gy, Left Supraclavicular / 50.4 Gy in 28 fractions of 1.8 Gy, and boost of 10 Gy in 5 fractions of 2 Gy each.   . Obesity   . Personal history of radiation therapy 07/2018  . Pseudotumor cerebri syndrome   . Seasonal allergies   . Stuffy nose 06/11/2018    PAST SURGICAL HISTORY: Past Surgical History:  Procedure Laterality Date  . BIOPSY  11/08/2015   Procedure: BIOPSY;  Surgeon: Danie Binder, MD;  [facility];  Service: Endoscopy;;  Gastric bx and Gastric polyp bx  . BREAST BIOPSY Left 05/07/2018   x2  . BREAST LUMPECTOMY Left 06/24/2018  . BREAST LUMPECTOMY WITH RADIOACTIVE SEED AND  SENTINEL LYMPH NODE BIOPSY Left 06/24/2018   Procedure: LEFT BREAST LUMPECTOMY WITH  BRACKETED RADIOACTIVE SEED AND LEFT AXILLARY DEEP SENTINEL LYMPH NODE BIOPSY AND BLUE DYE INJECTION;  Surgeon: Fanny Skates, MD;  Location: [facility];  Service: General;  Laterality: Left;  . CESAREAN SECTION  11/03/2003  . CESAREAN SECTION  05/15/2006  . CHOLECYSTECTOMY    . COLONOSCOPY N/A 11/08/2015   Procedure: COLONOSCOPY;  Surgeon: Danie Binder, MD;  [facility];  Service: Endoscopy;  Laterality: N/A;  1000  . ESOPHAGOGASTRODUODENOSCOPY N/A 11/08/2015   Procedure: ESOPHAGOGASTRODUODENOSCOPY (EGD);  Surgeon: Danie Binder, MD;  [facility];  Service: Endoscopy;  Laterality: N/A;  . HEMORRHOID BANDING N/A 11/08/2015   Procedure: HEMORRHOID BANDING;  Surgeon: Danie Binder, MD;  [facility];  Service: Endoscopy;  Laterality: N/A;  . SAVORY DILATION N/A 11/08/2015   Procedure: SAVORY DILATION;  Surgeon: Danie Binder, MD;  [facility];  Service: Endoscopy;  Laterality: N/A;  . WISDOM TOOTH EXTRACTION      FAMILY HISTORY: Family History  Problem Relation Age of Onset  . COPD Mother   . Pneumonia Mother   . Hyperlipidemia Father   .  Kidney cancer Father 63       "simple renal cell carcinoma"  . Heart disease Maternal Aunt   . Hypertension Maternal Grandmother   . Diabetes Maternal Grandmother   . Stroke Paternal Grandmother   . Hypertension Paternal Grandmother   . Diabetes Paternal Grandmother   . Stomach cancer Paternal Grandmother 52       had a portion of stomah removed  . Diabetes Paternal Grandfather   . Heart disease Paternal Grandfather   . Hyperlipidemia Paternal Grandfather   . Hypertension Paternal Grandfather   . Bladder Cancer Maternal Uncle        hx smoking  . Lung cancer Other    As of November 2019 her father is 5 years old. Patients' mother died from pneumonia at age 31. The patient has 0 brothers and 0  sisters. Patient denies any family story of ovarian or breast cancer. Her paternal grandmother had gastric cancer in 2000. Her paternal aunt had lung cancer without a hx of smoking. Her maternal uncle has had bladder cancer.    GYNECOLOGIC HISTORY:  No LMP recorded. (Menstrual status: Chemotherapy). Menarche: 38 years old Age at first live birth: 38 years old [place] P2 LMP: No Contraceptive: Implanon in place.  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 Her husband Breanna [name] works for the [employer] as an Mining engineer. Her two children are 31 years old and 35 years old.  The patient attends full [employer] in [place].   ADVANCED DIRECTIVES: In the absence of any documents to the contrary the patient's husband is her healthcare power of attorney   HEALTH MAINTENANCE: Social History   Tobacco Use  . Smoking status: Never Smoker  . Smokeless tobacco: Never Used  Substance Use Topics  . Alcohol use: No  . Drug use: No     Colonoscopy: 2017  PAP: 2017  Bone density: No   Allergies  Allergen Reactions  . Adhesive [Tape] Other (See Comments)    BLISTERS  . Latex Hives and Itching  . Topamax [Topiramate] Other (See Comments)    ALTERED MENTAL STATUS  . Celecoxib Palpitations  . Codeine Itching and Rash  . Penicillins Itching and Rash       . Sulfa Antibiotics Itching and Rash    Current Outpatient Medications  Medication Sig Dispense Refill  . acetaZOLAMIDE (DIAMOX) 500 MG capsule Take 500 mg by mouth daily.    Marland Kitchen albuterol (PROVENTIL HFA;VENTOLIN HFA) 108 (90 Base) MCG/ACT inhaler Inhale into the lungs every 6 (six) hours as needed for wheezing or shortness of breath.    . ALPRAZolam (XANAX) 0.5 MG tablet Take 1 tablet by mouth daily as needed for anxiety.     Marland Kitchen anastrozole (ARIMIDEX) 1 MG tablet Take 1 tablet (1 mg total) by mouth daily. 90 tablet 4  . busPIRone (BUSPAR) 7.5 MG  tablet Take 7.5 mg by mouth daily.     . cetirizine (ZYRTEC) 10 MG tablet Take 10 mg by mouth daily.    . Cyanocobalamin (VITAMIN B 12) 500 MCG TABS Take by mouth.    Marland Kitchen HYDROcodone-acetaminophen (NORCO) 5-325 MG tablet Take 1-2 tablets by mouth every 6 (six) hours as needed for moderate pain or severe pain. (Patient not taking: Reported on 07/24/2018) 30 tablet 0  . pantoprazole (PROTONIX) 40 MG tablet Take 40 mg by mouth daily.    Marland Kitchen zonisamide (ZONEGRAN) 25  MG capsule Take 50 mg by mouth daily.      No current facility-administered medications for this visit.    OBJECTIVE: Morbidly obese white woman who appears stated age  41:   07/03/19 1515  BP: (!) 123/91  Pulse: 84  Resp: 18  Temp: 98.2 F (36.8 C)  SpO2: 99%     Body mass index is 43.73 kg/m.   Wt Readings from Last 3 Encounters:  07/03/19 262 lb 12.8 oz (119.2 kg)  02/25/19 250 lb 9.6 oz (113.7 kg)  01/09/19 252 lb 11.2 oz (114.6 kg)      ECOG FS:1 - Symptomatic but completely ambulatory  Sclerae unicteric, EOMs intact Wearing a mask No cervical or supraclavicular adenopathy Lungs no rales or rhonchi Heart regular rate and rhythm Abd soft, nontender, positive bowel sounds MSK no focal spinal tenderness, no upper extremity lymphedema Neuro: non focal, well oriented, appropriate affect Breasts: the right breast is benign.  The left breast has undergone lumpectomy followed by radiation with no evidence of disease recurrence.  Both axillae are benign.     LAB RESULTS:  CMP     Component Value Date/Time   NA 138 06/17/2018 0849   K 3.5 06/17/2018 0849   CL 109 06/17/2018 0849   CO2 23 06/17/2018 0849   GLUCOSE 116 (H) 06/17/2018 0849   BUN 11 06/17/2018 0849   CREATININE 1.20 (H) 06/17/2018 0849   CREATININE 1.05 (H) 05/22/2018 1158   CREATININE 1.08 08/22/2013 1039   CALCIUM 8.6 (L) 06/17/2018 0849   PROT 7.1 06/17/2018 0849   ALBUMIN 3.8 06/17/2018 0849   AST 25 06/17/2018 0849   AST 19 05/22/2018 1158    ALT 33 06/17/2018 0849   ALT 25 05/22/2018 1158   ALKPHOS 41 06/17/2018 0849   BILITOT 0.8 06/17/2018 0849   BILITOT 0.6 05/22/2018 1158   GFRNONAA 57 (L) 06/17/2018 0849   GFRNONAA >60 05/22/2018 1158   GFRAA >60 06/17/2018 0849   GFRAA >60 05/22/2018 1158    No results found for: TOTALPROTELP, ALBUMINELP, A1GS, A2GS, BETS, BETA2SER, GAMS, MSPIKE, SPEI  No results found for: KPAFRELGTCHN, LAMBDASER, KAPLAMBRATIO  Lab Results  Component Value Date   WBC 10.1 06/17/2018   NEUTROABS 6.7 06/17/2018   HGB 14.3 06/17/2018   HCT 42.9 06/17/2018   MCV 97.3 06/17/2018   PLT 282 06/17/2018    _0 @  No results found for: LABCA2  No components found for: [id number]  No results for input(s): INR in the last 168 hours.  No results found for: LABCA2  No results found for: NOI370  No results found for: WUG891  No results found for: QXI503  No results found for: CA2729  No components found for: HGQUANT  No results found for: CEA1 / No results found for: CEA1   No results found for: AFPTUMOR  No results found for: CHROMOGRNA  No results found for: PSA1  No visits with results within 3 Day(s) from this visit.  Latest known visit with results is:  Hospital Outpatient Visit on 07/24/2018  Component Date Value Ref Range Status  . Preg Test, Ur 07/24/2018 NEGATIVE  NEGATIVE Final   Comment:        THE SENSITIVITY OF THIS METHODOLOGY IS >20 mIU/mL. Performed at [facility], [place] [address], [place], [place] [address]     (this displays the last labs from the last 3 days)  No results found for: TOTALPROTELP, ALBUMINELP, A1GS, A2GS, BETS, BETA2SER, GAMS, MSPIKE, SPEI (this displays SPEP  labs)  No results found for: KPAFRELGTCHN, LAMBDASER, KAPLAMBRATIO (kappa/lambda light chains)  No results found for: HGBA, HGBA2QUANT, HGBFQUANT, HGBSQUAN (Hemoglobinopathy evaluation)   No results found for: LDH  No results found for: IRON, TIBC,  IRONPCTSAT (Iron and TIBC)  No results found for: FERRITIN  Urinalysis No results found for: COLORURINE, APPEARANCEUR, LABSPEC, PHURINE, GLUCOSEU, HGBUR, BILIRUBINUR, KETONESUR, PROTEINUR, UROBILINOGEN, NITRITE, LEUKOCYTESUR   STUDIES:  No results found.   ELIGIBLE FOR AVAILABLE RESEARCH PROTOCOL: no   ASSESSMENT: 38 y.o. [facility] woman, status post left breast biopsy x2 on 05/07/2018, showing (a) left breast upper inner quadrant 2 cm from the nipple: a clinical T1b N0, stage IA invasive ductal carcinoma, grade 1, estrogen and progesterone receptor strongly positive, HER-2 not amplified, with an MIB-1 of 10%; strongly E-cadherin positive (b) left breast upper inner quadrant 3 cm from the nipple, a clinical T1c N0, stage IA invasive ductal carcinoma, grade 2, estrogen receptor positive with moderate staining intensity, progesterone receptor positive with strong staining intensity, with an MIB-1 of 10%, and no HER-2 amplification; faint but positive E-cadherin stain  (1) genetics testing 05/28/2018 through the Multi-Cancer Panel offered by [facility] showed no deleterious mutations in AIP, ALK, APC, ATM, AXIN2, BAP1, BARD1, BLM, BMPR1A, BRCA1, BRCA2, BRIP1, BUB1B, CASR, CDC73, CDH1, CDK4, CDKN1B, CDKN1C, CDKN2A, CEBPA, CEP57, CHEK2, CTNNA1, DICER1, DIS3L2, EGFR, ENG, EPCAM, FH, FLCN, GALNT12, GATA2, GPC3, GREM1, HOXB13, HRAS, KIT, MAX, MEN1, MET, MITF, MLH1, MLH3, MSH2, MSH3, MSH6, MUTYH, NBN, NF1, NF2, NTHL1, PALB2, PDGFRA, PHOX2B, PMS2, POLD1, POLE, POT1, PRKAR1A, PTCH1, PTEN, RAD50, RAD51C, RAD51D, RB1, RECQL4, RET, RNF43, RPS20, RUNX1, SDHA, SDHAF2, SDHB, SDHC, SDHD, SMAD4, SMARCA4, SMARCB1, SMARCE1, STK11, SUFU, TERC, TERT, TMEM127, TP53, TSC1, TSC2, VHL, WRN, WT1  (2) status post left lumpectomy and sentinel lymph node sampling 06/24/2018 for a pT2 pN1, stage IIA invasive ductal carcinoma, grade 1, with negative margins  (a) a total of 2 sentinel lymph nodes were  removed  (3) MammaPrint read as low risk, with an average 10-year risk of recurrence with no treatment of 10%, the 5-year disease-free survival being 97.8% with hormone treatment alone  (4) adjuvant radiation 07/31/2018 - 09/17/2018  (a) Left Breast and IM nodes/ 50.4 Gy in 28 fractions of 1.8 Gy  (b) Left post axilla/ supraclavicular / 50.4 Gy in 28 fractions of 1.8 Gy  (c) Boost / 10 Gy in 5 fractions of 2 Gy  (5) anastrozole started 09/19/2018  (a) goserelin started 09/25/2018   PLAN: Breanna [name] is now a year out from definitive surgery for her breast cancer with no evidence of disease recurrence.  This is favorable.  She is tolerating anastrozole moderately well.  She is certainly having quite a bit of difficulty with insomnia and hot flashes at night.  I think she would benefit from gabapentin at bedtime.  She has a good understanding of the possible toxicities side effects and complications of this agent.  Generally it will help her fall asleep and it will cut back on the nighttime hot flashes.  She will not take it during the day because it does cause drowsiness.  She does not feel she can get back to work at the [employer] given the degree of coronavirus exposure there.  Once a vaccine is available to her that should change.  We are continuing the goserelin every 28 days, and I will see her again in 6 months.  Hopefully by then she and others will have had the vaccine and we can get closer to  normal.  In the meantime she is taking vitamin D and zinc.  She knows to call for any other issue that may develop before the next visit.  Taleeya Blondin, Virgie Dad, MD  07/03/19 3:26 PM Medical Oncology and Hematology [facility] [place], [place] [address] Tel. [phone]    Fax. [phone]    I, Wilburn Mylar, am acting as scribe for Dr. Virgie Dad. Jaquell Seddon.  I, Lurline Del MD, have reviewed the above documentation for accuracy and completeness, and I  agree with the above.

## 2019-07-03 ENCOUNTER — Inpatient Hospital Stay: Payer: 59 | Attending: Oncology | Admitting: Oncology

## 2019-07-03 ENCOUNTER — Inpatient Hospital Stay: Payer: 59

## 2019-07-03 ENCOUNTER — Other Ambulatory Visit: Payer: Self-pay

## 2019-07-03 VITALS — BP 123/91 | HR 84 | Temp 98.2°F | Resp 18 | Ht 65.0 in | Wt 262.8 lb

## 2019-07-03 DIAGNOSIS — K219 Gastro-esophageal reflux disease without esophagitis: Secondary | ICD-10-CM | POA: Diagnosis not present

## 2019-07-03 DIAGNOSIS — C50812 Malignant neoplasm of overlapping sites of left female breast: Secondary | ICD-10-CM

## 2019-07-03 DIAGNOSIS — F419 Anxiety disorder, unspecified: Secondary | ICD-10-CM | POA: Diagnosis not present

## 2019-07-03 DIAGNOSIS — G4700 Insomnia, unspecified: Secondary | ICD-10-CM | POA: Insufficient documentation

## 2019-07-03 DIAGNOSIS — R232 Flushing: Secondary | ICD-10-CM | POA: Insufficient documentation

## 2019-07-03 DIAGNOSIS — Z800 Family history of malignant neoplasm of digestive organs: Secondary | ICD-10-CM | POA: Diagnosis not present

## 2019-07-03 DIAGNOSIS — M1990 Unspecified osteoarthritis, unspecified site: Secondary | ICD-10-CM | POA: Insufficient documentation

## 2019-07-03 DIAGNOSIS — Z5111 Encounter for antineoplastic chemotherapy: Secondary | ICD-10-CM | POA: Diagnosis not present

## 2019-07-03 DIAGNOSIS — E669 Obesity, unspecified: Secondary | ICD-10-CM | POA: Insufficient documentation

## 2019-07-03 DIAGNOSIS — J45909 Unspecified asthma, uncomplicated: Secondary | ICD-10-CM | POA: Insufficient documentation

## 2019-07-03 DIAGNOSIS — Z79811 Long term (current) use of aromatase inhibitors: Secondary | ICD-10-CM | POA: Insufficient documentation

## 2019-07-03 DIAGNOSIS — Z801 Family history of malignant neoplasm of trachea, bronchus and lung: Secondary | ICD-10-CM | POA: Insufficient documentation

## 2019-07-03 DIAGNOSIS — Z170 Estrogen receptor positive status [ER+]: Secondary | ICD-10-CM

## 2019-07-03 DIAGNOSIS — Z79899 Other long term (current) drug therapy: Secondary | ICD-10-CM | POA: Diagnosis not present

## 2019-07-03 MED ORDER — GOSERELIN ACETATE 3.6 MG ~~LOC~~ IMPL
3.6000 mg | DRUG_IMPLANT | Freq: Once | SUBCUTANEOUS | Status: AC
  Administered 2019-07-03: 16:00:00 3.6 mg via SUBCUTANEOUS
  Filled 2019-07-03: qty 3.6

## 2019-07-03 MED ORDER — GABAPENTIN 300 MG PO CAPS: 300.0000 mg | ORAL_CAPSULE | Freq: Every day | ORAL | 4 refills | Status: DC

## 2019-07-03 NOTE — Patient Instructions (Signed)

## 2019-07-04 ENCOUNTER — Telehealth: Payer: Self-pay | Admitting: Oncology

## 2019-07-04 NOTE — Telephone Encounter (Signed)
I talk with patient regarding schedule  

## 2019-07-22 ENCOUNTER — Encounter: Payer: Self-pay | Admitting: *Deleted

## 2019-07-22 ENCOUNTER — Telehealth: Payer: Self-pay | Admitting: *Deleted

## 2019-07-22 NOTE — Telephone Encounter (Signed)
Letter provided to return to work per discussion with pt on phone.

## 2019-07-31 ENCOUNTER — Inpatient Hospital Stay: Payer: 59 | Attending: Oncology

## 2019-07-31 ENCOUNTER — Other Ambulatory Visit: Payer: Self-pay

## 2019-07-31 VITALS — BP 128/72 | HR 78 | Temp 98.2°F | Resp 18

## 2019-07-31 DIAGNOSIS — C50812 Malignant neoplasm of overlapping sites of left female breast: Secondary | ICD-10-CM | POA: Diagnosis not present

## 2019-07-31 DIAGNOSIS — Z5111 Encounter for antineoplastic chemotherapy: Secondary | ICD-10-CM | POA: Insufficient documentation

## 2019-07-31 DIAGNOSIS — Z170 Estrogen receptor positive status [ER+]: Secondary | ICD-10-CM | POA: Diagnosis not present

## 2019-07-31 MED ORDER — GOSERELIN ACETATE 3.6 MG ~~LOC~~ IMPL
DRUG_IMPLANT | SUBCUTANEOUS | Status: AC
  Filled 2019-07-31: qty 3.6

## 2019-07-31 MED ORDER — GOSERELIN ACETATE 3.6 MG ~~LOC~~ IMPL
3.6000 mg | DRUG_IMPLANT | Freq: Once | SUBCUTANEOUS | Status: AC
  Administered 2019-07-31: 15:00:00 3.6 mg via SUBCUTANEOUS

## 2019-07-31 NOTE — Patient Instructions (Signed)

## 2019-08-06 NOTE — Telephone Encounter (Signed)
No entry 

## 2019-08-28 ENCOUNTER — Other Ambulatory Visit: Payer: Self-pay

## 2019-08-28 ENCOUNTER — Inpatient Hospital Stay: Payer: 59 | Attending: Oncology

## 2019-08-28 VITALS — BP 128/72 | HR 72 | Temp 98.2°F | Resp 18

## 2019-08-28 DIAGNOSIS — Z5111 Encounter for antineoplastic chemotherapy: Secondary | ICD-10-CM | POA: Diagnosis not present

## 2019-08-28 DIAGNOSIS — Z170 Estrogen receptor positive status [ER+]: Secondary | ICD-10-CM | POA: Diagnosis not present

## 2019-08-28 DIAGNOSIS — C50812 Malignant neoplasm of overlapping sites of left female breast: Secondary | ICD-10-CM | POA: Diagnosis not present

## 2019-08-28 MED ORDER — GOSERELIN ACETATE 3.6 MG ~~LOC~~ IMPL
3.6000 mg | DRUG_IMPLANT | Freq: Once | SUBCUTANEOUS | Status: AC
  Administered 2019-08-28: 16:00:00 3.6 mg via SUBCUTANEOUS

## 2019-08-28 MED ORDER — GOSERELIN ACETATE 3.6 MG ~~LOC~~ IMPL
DRUG_IMPLANT | SUBCUTANEOUS | Status: AC
  Filled 2019-08-28: qty 3.6

## 2019-08-28 NOTE — Patient Instructions (Signed)

## 2019-09-25 ENCOUNTER — Other Ambulatory Visit: Payer: Self-pay

## 2019-09-25 ENCOUNTER — Inpatient Hospital Stay: Payer: 59 | Attending: Oncology

## 2019-09-25 VITALS — BP 129/70 | HR 71 | Temp 98.2°F | Resp 18

## 2019-09-25 DIAGNOSIS — C50812 Malignant neoplasm of overlapping sites of left female breast: Secondary | ICD-10-CM | POA: Diagnosis not present

## 2019-09-25 DIAGNOSIS — Z170 Estrogen receptor positive status [ER+]: Secondary | ICD-10-CM | POA: Diagnosis not present

## 2019-09-25 DIAGNOSIS — Z5111 Encounter for antineoplastic chemotherapy: Secondary | ICD-10-CM | POA: Insufficient documentation

## 2019-09-25 MED ORDER — GOSERELIN ACETATE 3.6 MG ~~LOC~~ IMPL
DRUG_IMPLANT | SUBCUTANEOUS | Status: AC
  Filled 2019-09-25: qty 3.6

## 2019-09-25 MED ORDER — GOSERELIN ACETATE 3.6 MG ~~LOC~~ IMPL
3.6000 mg | DRUG_IMPLANT | Freq: Once | SUBCUTANEOUS | Status: AC
  Administered 2019-09-25: 3.6 mg via SUBCUTANEOUS

## 2019-09-25 NOTE — Patient Instructions (Signed)

## 2019-10-20 ENCOUNTER — Other Ambulatory Visit: Payer: Self-pay | Admitting: Oncology

## 2019-10-23 ENCOUNTER — Inpatient Hospital Stay: Payer: 59 | Attending: Oncology

## 2019-10-23 ENCOUNTER — Other Ambulatory Visit: Payer: Self-pay

## 2019-10-23 VITALS — BP 130/72 | Resp 18

## 2019-10-23 DIAGNOSIS — C50812 Malignant neoplasm of overlapping sites of left female breast: Secondary | ICD-10-CM | POA: Insufficient documentation

## 2019-10-23 DIAGNOSIS — Z170 Estrogen receptor positive status [ER+]: Secondary | ICD-10-CM | POA: Diagnosis not present

## 2019-10-23 DIAGNOSIS — Z5111 Encounter for antineoplastic chemotherapy: Secondary | ICD-10-CM | POA: Insufficient documentation

## 2019-10-23 MED ORDER — GOSERELIN ACETATE 3.6 MG ~~LOC~~ IMPL
3.6000 mg | DRUG_IMPLANT | Freq: Once | SUBCUTANEOUS | Status: AC
  Administered 2019-10-23: 3.6 mg via SUBCUTANEOUS

## 2019-10-23 MED ORDER — GOSERELIN ACETATE 3.6 MG ~~LOC~~ IMPL
DRUG_IMPLANT | SUBCUTANEOUS | Status: AC
  Filled 2019-10-23: qty 3.6

## 2019-10-23 NOTE — Patient Instructions (Signed)

## 2019-11-20 ENCOUNTER — Inpatient Hospital Stay: Payer: 59

## 2019-11-26 ENCOUNTER — Inpatient Hospital Stay: Payer: 59 | Attending: Oncology

## 2019-11-26 ENCOUNTER — Other Ambulatory Visit: Payer: Self-pay

## 2019-11-26 VITALS — BP 111/79 | HR 80 | Temp 98.7°F | Resp 20

## 2019-11-26 DIAGNOSIS — C50812 Malignant neoplasm of overlapping sites of left female breast: Secondary | ICD-10-CM | POA: Diagnosis not present

## 2019-11-26 DIAGNOSIS — Z170 Estrogen receptor positive status [ER+]: Secondary | ICD-10-CM | POA: Insufficient documentation

## 2019-11-26 DIAGNOSIS — Z5111 Encounter for antineoplastic chemotherapy: Secondary | ICD-10-CM | POA: Diagnosis not present

## 2019-11-26 MED ORDER — GOSERELIN ACETATE 3.6 MG ~~LOC~~ IMPL
3.6000 mg | DRUG_IMPLANT | Freq: Once | SUBCUTANEOUS | Status: AC
  Administered 2019-11-26: 3.6 mg via SUBCUTANEOUS

## 2019-11-26 MED ORDER — GOSERELIN ACETATE 3.6 MG ~~LOC~~ IMPL
DRUG_IMPLANT | SUBCUTANEOUS | Status: AC
  Filled 2019-11-26: qty 3.6

## 2019-11-26 NOTE — Patient Instructions (Signed)

## 2019-12-18 ENCOUNTER — Other Ambulatory Visit: Payer: Self-pay

## 2019-12-18 ENCOUNTER — Inpatient Hospital Stay (HOSPITAL_BASED_OUTPATIENT_CLINIC_OR_DEPARTMENT_OTHER): Payer: 59 | Admitting: Oncology

## 2019-12-18 ENCOUNTER — Inpatient Hospital Stay: Payer: 59 | Attending: Oncology

## 2019-12-18 ENCOUNTER — Inpatient Hospital Stay: Payer: 59

## 2019-12-18 VITALS — BP 130/88 | HR 92 | Temp 99.1°F | Resp 20 | Ht 65.0 in | Wt 265.6 lb

## 2019-12-18 DIAGNOSIS — J45909 Unspecified asthma, uncomplicated: Secondary | ICD-10-CM | POA: Diagnosis not present

## 2019-12-18 DIAGNOSIS — Z170 Estrogen receptor positive status [ER+]: Secondary | ICD-10-CM | POA: Diagnosis not present

## 2019-12-18 DIAGNOSIS — F419 Anxiety disorder, unspecified: Secondary | ICD-10-CM | POA: Diagnosis not present

## 2019-12-18 DIAGNOSIS — Z801 Family history of malignant neoplasm of trachea, bronchus and lung: Secondary | ICD-10-CM | POA: Diagnosis not present

## 2019-12-18 DIAGNOSIS — M129 Arthropathy, unspecified: Secondary | ICD-10-CM | POA: Diagnosis not present

## 2019-12-18 DIAGNOSIS — Z5111 Encounter for antineoplastic chemotherapy: Secondary | ICD-10-CM | POA: Diagnosis not present

## 2019-12-18 DIAGNOSIS — Z79899 Other long term (current) drug therapy: Secondary | ICD-10-CM | POA: Insufficient documentation

## 2019-12-18 DIAGNOSIS — Z800 Family history of malignant neoplasm of digestive organs: Secondary | ICD-10-CM | POA: Diagnosis not present

## 2019-12-18 DIAGNOSIS — E669 Obesity, unspecified: Secondary | ICD-10-CM | POA: Diagnosis not present

## 2019-12-18 DIAGNOSIS — Z79811 Long term (current) use of aromatase inhibitors: Secondary | ICD-10-CM | POA: Diagnosis not present

## 2019-12-18 DIAGNOSIS — Z923 Personal history of irradiation: Secondary | ICD-10-CM | POA: Diagnosis not present

## 2019-12-18 DIAGNOSIS — R232 Flushing: Secondary | ICD-10-CM | POA: Insufficient documentation

## 2019-12-18 DIAGNOSIS — Z8052 Family history of malignant neoplasm of bladder: Secondary | ICD-10-CM | POA: Insufficient documentation

## 2019-12-18 DIAGNOSIS — K219 Gastro-esophageal reflux disease without esophagitis: Secondary | ICD-10-CM | POA: Insufficient documentation

## 2019-12-18 DIAGNOSIS — C50812 Malignant neoplasm of overlapping sites of left female breast: Secondary | ICD-10-CM | POA: Insufficient documentation

## 2019-12-18 DIAGNOSIS — Z6841 Body Mass Index (BMI) 40.0 and over, adult: Secondary | ICD-10-CM | POA: Diagnosis not present

## 2019-12-18 DIAGNOSIS — Z8051 Family history of malignant neoplasm of kidney: Secondary | ICD-10-CM | POA: Insufficient documentation

## 2019-12-18 LAB — CBC WITH DIFFERENTIAL/PLATELET
Abs Immature Granulocytes: 0.02 10*3/uL (ref 0.00–0.07)
Basophils Absolute: 0.1 10*3/uL (ref 0.0–0.1)
Basophils Relative: 1 %
Eosinophils Absolute: 0.5 10*3/uL (ref 0.0–0.5)
Eosinophils Relative: 5 %
HCT: 44.1 % (ref 36.0–46.0)
Hemoglobin: 15.2 g/dL — ABNORMAL HIGH (ref 12.0–15.0)
Immature Granulocytes: 0 %
Lymphocytes Relative: 26 %
Lymphs Abs: 2.3 10*3/uL (ref 0.7–4.0)
MCH: 31.5 pg (ref 26.0–34.0)
MCHC: 34.5 g/dL (ref 30.0–36.0)
MCV: 91.5 fL (ref 80.0–100.0)
Monocytes Absolute: 0.7 10*3/uL (ref 0.1–1.0)
Monocytes Relative: 7 %
Neutro Abs: 5.4 10*3/uL (ref 1.7–7.7)
Neutrophils Relative %: 61 %
Platelets: 236 10*3/uL (ref 150–400)
RBC: 4.82 MIL/uL (ref 3.87–5.11)
RDW: 12.8 % (ref 11.5–15.5)
WBC: 8.9 10*3/uL (ref 4.0–10.5)
nRBC: 0 % (ref 0.0–0.2)

## 2019-12-18 LAB — COMPREHENSIVE METABOLIC PANEL
ALT: 52 U/L — ABNORMAL HIGH (ref 0–44)
AST: 37 U/L (ref 15–41)
Albumin: 3.7 g/dL (ref 3.5–5.0)
Alkaline Phosphatase: 83 U/L (ref 38–126)
Anion gap: 9 (ref 5–15)
BUN: 12 mg/dL (ref 6–20)
CO2: 23 mmol/L (ref 22–32)
Calcium: 9.2 mg/dL (ref 8.9–10.3)
Chloride: 111 mmol/L (ref 98–111)
Creatinine, Ser: 1.35 mg/dL — ABNORMAL HIGH (ref 0.44–1.00)
GFR calc Af Amer: 57 mL/min — ABNORMAL LOW (ref >60)
GFR calc non Af Amer: 49 mL/min — ABNORMAL LOW (ref >60)
Glucose, Bld: 91 mg/dL (ref 70–99)
Potassium: 3.9 mmol/L (ref 3.5–5.1)
Sodium: 143 mmol/L (ref 135–145)
Total Bilirubin: 0.5 mg/dL (ref 0.3–1.2)
Total Protein: 7.3 g/dL (ref 6.5–8.1)

## 2019-12-18 MED ORDER — GOSERELIN ACETATE 10.8 MG ~~LOC~~ IMPL
10.8000 mg | DRUG_IMPLANT | Freq: Once | SUBCUTANEOUS | Status: AC
  Administered 2019-12-18: 10.8 mg via SUBCUTANEOUS
  Filled 2019-12-18: qty 10.8

## 2019-12-18 NOTE — Patient Instructions (Signed)

## 2019-12-18 NOTE — Progress Notes (Signed)
[place]  [phone] [phone]    ID: Breanna [name] DOB: 03-Dec-1980  MR#: [id number]  NGE#:[id number]  Patient Care Team: Ginger Organ as PCP - General (Physician Assistant) Danie Binder, MD (Inactive) as Consulting Physician (Gastroenterology) Aurelia Gras, Virgie Dad, MD as Consulting Physician (Oncology) Eppie Gibson, MD as Attending Physician (Radiation Oncology) Fanny Skates, MD as Consulting Physician (General Surgery) Estill Dooms, NP as Nurse Practitioner (Obstetrics and Gynecology) OTHER MD:   CHIEF COMPLAINT: Estrogen receptor positive multifocal breast cancer  CURRENT TREATMENT: goserelin/Zoladex; anastrozole   INTERVAL HISTORY: Breanna [name] returns today for follow-up and treatment of her estrogen receptor positive multifocal breast cancer.  She is accompanied by her husband Breanna [name].  She continues on anastrozole.  Hot flashes she says are getting a little bit better.  Vaginal dryness is not currently an issue.  Her most recent bone density on 05/19/2019 showed a Z score of 1.4, which is expected for age.  She also receives goserelin every 4 weeks.  She does not like the shots of course but she also wonders if this is contributing to weight gain.  There is also a hefty co-pay with each dose which is a problem for them.  REVIEW OF SYSTEMS: Breanna [name] is back to work at the [employer].  She has been vaccinated with the Moderna vaccine.  Her husband has opted against being vaccinated for now.  She says about one third of the employees at the [facility] have been vaccinated but just about all the residents have been.  She clocks about [phone] steps a day at work.  She does not have a lot of energy left to go to the gym even though she has a Higher education careers adviser.  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  A detailed review of systems today was otherwise stable   HISTORY OF CURRENT ILLNESS: From the original intake  note:  Breanna [name] noted a palpable abnormality in the 4 o'clock location of the LEFT breast sometime in September 2019.  She brought it up to medical attention and underwent bilateral diagnostic mammography with tomography and left breast ultrasonography at [facility] on 04/23/2018 showing: a Suspicious mass in the 9:30 o'clock location of the LEFT breast 2 centimeters from the nipple; by ultrasound this was an irregular hypoechoic mass with spiculated margins and posterior acoustic shadowing which measures 0.5 x 0.7 x 1.1 centimeters. A second similar lesion is also suspected in the same portion of the breast but has not been completely evaluated. Recommend targeted ultrasound of the MEDIAL aspect of the LEFT breast at the time of patient's biopsy and possible second biopsy. LEFT axilla is negative for adenopathy. The areas of clinical concern in the LOWER OUTER QUADRANT of the LEFT breast in the [place] 10 centimeters from the nipple are negative by mammogram and ultrasound.  Accordingly on 05/07/2018 the patient proceeded to biopsy of the left breast areas in question. The pathology from this procedure showed ([id number]):  1. Breast, left, needle core biopsy, at 9:30 o'clock 3 cm from the nipple: invasive mammary carcinoma, grade II. Mammary carcinoma in situ.  2. Breast, left, needle core biopsy, 9:30 o'clock 2 cm from the nipple with invasive ductal carcinoma, grade I. Mammary carcinoma in situ.  E-cadherin stains are faint in the larger tumor but still positive, strong in the second tumor, and thus these are read as ductal.  Prognostic indicators significant for:  1. Estrogen receptor, 80% positive,  moderate staining intensity and progesterone receptor, 80% positive, strong staining intensity. Proliferation marker Ki67 at 10%. HER2 negative by immunohistochemistry, 1+.   2. Estrogen Receptor: 90%, positive, strong staining intensity and progesterone Receptor: 90%,  positive, strong staining intensity. Proliferation Marker Ki67: 10%. HER2 negative by immunohistochemistry, 1+.    The patient's subsequent history is as detailed below.   PAST MEDICAL HISTORY: Past Medical History:  Diagnosis Date  . Anxiety   . Arthritis    knees  . Asthma   . Breast cancer ([place]) 05/2018   left   . Cluster headaches   . Cough 06/11/2018  . Dental crown present   . Family history of bladder cancer   . Family history of kidney cancer   . Family history of lung cancer   . Family history of stomach cancer   . GERD (gastroesophageal reflux disease)   . Heart palpitations    occasional - no cardiologist  . History of asthma    as a child - prn inhaler  . History of radiation therapy 07/31/18- 09/17/18   Left Breast / 50.4 Gy in 28 fractions of 1.8 Gy, Left Supraclavicular / 50.4 Gy in 28 fractions of 1.8 Gy, and boost of 10 Gy in 5 fractions of 2 Gy each.   . Obesity   . Personal history of radiation therapy 07/2018  . Pseudotumor cerebri syndrome   . Seasonal allergies   . Stuffy nose 06/11/2018    PAST SURGICAL HISTORY: Past Surgical History:  Procedure Laterality Date  . BIOPSY  11/08/2015   Procedure: BIOPSY;  Surgeon: Danie Binder, MD;  [facility];  Service: Endoscopy;;  Gastric bx and Gastric polyp bx  . BREAST BIOPSY Left 05/07/2018   x2  . BREAST LUMPECTOMY Left 06/24/2018  . BREAST LUMPECTOMY WITH RADIOACTIVE SEED AND SENTINEL LYMPH NODE BIOPSY Left 06/24/2018   Procedure: LEFT BREAST LUMPECTOMY WITH  BRACKETED RADIOACTIVE SEED AND LEFT AXILLARY DEEP SENTINEL LYMPH NODE BIOPSY AND BLUE DYE INJECTION;  Surgeon: Fanny Skates, MD;  Location: [facility];  Service: General;  Laterality: Left;  . CESAREAN SECTION  11/03/2003  . CESAREAN SECTION  05/15/2006  . CHOLECYSTECTOMY    . COLONOSCOPY N/A 11/08/2015   Procedure: COLONOSCOPY;  Surgeon: Danie Binder, MD;  [facility];  Service: Endoscopy;  Laterality: N/A;   1000  . ESOPHAGOGASTRODUODENOSCOPY N/A 11/08/2015   Procedure: ESOPHAGOGASTRODUODENOSCOPY (EGD);  Surgeon: Danie Binder, MD;  [facility];  Service: Endoscopy;  Laterality: N/A;  . HEMORRHOID BANDING N/A 11/08/2015   Procedure: HEMORRHOID BANDING;  Surgeon: Danie Binder, MD;  [facility];  Service: Endoscopy;  Laterality: N/A;  . SAVORY DILATION N/A 11/08/2015   Procedure: SAVORY DILATION;  Surgeon: Danie Binder, MD;  [facility];  Service: Endoscopy;  Laterality: N/A;  . WISDOM TOOTH EXTRACTION      FAMILY HISTORY: Family History  Problem Relation Age of Onset  . COPD Mother   . Pneumonia Mother   . Hyperlipidemia Father   . Kidney cancer Father 6       "simple renal cell carcinoma"  . Heart disease Maternal Aunt   . Hypertension Maternal Grandmother   . Diabetes Maternal Grandmother   . Stroke Paternal Grandmother   . Hypertension Paternal Grandmother   . Diabetes Paternal Grandmother   . Stomach cancer Paternal Grandmother 61       had a portion of stomah removed  . Diabetes Paternal  Grandfather   . Heart disease Paternal Grandfather   . Hyperlipidemia Paternal Grandfather   . Hypertension Paternal Grandfather   . Bladder Cancer Maternal Uncle        hx smoking  . Lung cancer Other    As of November 2019 her father is 50 years old. Patients' mother died from pneumonia at age 83. The patient has 0 brothers and 0 sisters. Patient denies any family story of ovarian or breast cancer. Her paternal grandmother had gastric cancer in 2000. Her paternal aunt had lung cancer without a hx of smoking. Her maternal uncle has had bladder cancer.    GYNECOLOGIC HISTORY:  No LMP recorded. (Menstrual status: Chemotherapy). Menarche: 39 years old Age at first live birth: 39 years old [place] P2 LMP: No Contraceptive: Implanon in place.  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 Her husband Breanna [name] works for the [employer] as an Mining engineer. Her two children are 44 years old and 46 years old.  The patient attends full [employer] in [place].   ADVANCED DIRECTIVES: In the absence of any documents to the contrary the patient's husband is her healthcare power of attorney   HEALTH MAINTENANCE: Social History   Tobacco Use  . Smoking status: Never Smoker  . Smokeless tobacco: Never Used  Substance Use Topics  . Alcohol use: No  . Drug use: No     Colonoscopy: 2017  PAP: 2017  Bone density: No   Allergies  Allergen Reactions  . Adhesive [Tape] Other (See Comments)    BLISTERS  . Latex Hives and Itching  . Topamax [Topiramate] Other (See Comments)    ALTERED MENTAL STATUS  . Celecoxib Palpitations  . Codeine Itching and Rash  . Penicillins Itching and Rash       . Sulfa Antibiotics Itching and Rash    Current Outpatient Medications  Medication Sig Dispense Refill  . acetaZOLAMIDE (DIAMOX) 500 MG capsule Take 500 mg by mouth daily.    Marland Kitchen albuterol (PROVENTIL HFA;VENTOLIN HFA) 108 (90 Base) MCG/ACT inhaler Inhale into the lungs every 6 (six) hours as needed for wheezing or shortness of breath.    . anastrozole (ARIMIDEX) 1 MG tablet TAKE 1 TABLET BY MOUTH ONCE A DAY. 90 tablet 0  . busPIRone (BUSPAR) 7.5 MG tablet Take 7.5 mg by mouth daily.     . cetirizine (ZYRTEC) 10 MG tablet Take 10 mg by mouth daily.    . Cyanocobalamin (VITAMIN B 12) 500 MCG TABS Take by mouth.    . gabapentin (NEURONTIN) 300 MG capsule Take 1 capsule (300 mg total) by mouth at bedtime. 90 capsule 4  . pantoprazole (PROTONIX) 40 MG tablet Take 40 mg by mouth daily.    . Vitamin D, Ergocalciferol, 50 MCG (2000 UT) CAPS Take by mouth. 30 capsule   . zonisamide (ZONEGRAN) 25 MG capsule Take 50 mg by mouth daily.      No current facility-administered medications for this visit.    OBJECTIVE: white woman who appears stated age  54:   12/18/19 1424  BP:  130/88  Pulse: 92  Resp: 20  Temp: 99.1 F (37.3 C)  SpO2: 100%     Body mass index is 44.2 kg/m.   Wt Readings from Last 3 Encounters:  12/18/19 265 lb 9.6 oz (120.5 kg)  07/03/19 262 lb 12.8 oz (119.2 kg)  02/25/19 250 lb  9.6 oz (113.7 kg)      ECOG FS:1 - Symptomatic but completely ambulatory  Sclerae unicteric, EOMs intact Wearing a mask No cervical or supraclavicular adenopathy Lungs no rales or rhonchi Heart regular rate and rhythm Abd soft, obese, nontender, positive bowel sounds MSK no focal spinal tenderness, no upper extremity lymphedema Neuro: nonfocal, well oriented, appropriate affect Breasts: Right breast is benign.  The left has a status post lumpectomy and radiation.  There is no evidence of local recurrence.  Both axillae are benign.   LAB RESULTS:  CMP     Component Value Date/Time   NA 138 06/17/2018 0849   K 3.5 06/17/2018 0849   CL 109 06/17/2018 0849   CO2 23 06/17/2018 0849   GLUCOSE 116 (H) 06/17/2018 0849   BUN 11 06/17/2018 0849   CREATININE 1.20 (H) 06/17/2018 0849   CREATININE 1.05 (H) 05/22/2018 1158   CREATININE 1.08 08/22/2013 1039   CALCIUM 8.6 (L) 06/17/2018 0849   PROT 7.1 06/17/2018 0849   ALBUMIN 3.8 06/17/2018 0849   AST 25 06/17/2018 0849   AST 19 05/22/2018 1158   ALT 33 06/17/2018 0849   ALT 25 05/22/2018 1158   ALKPHOS 41 06/17/2018 0849   BILITOT 0.8 06/17/2018 0849   BILITOT 0.6 05/22/2018 1158   GFRNONAA 57 (L) 06/17/2018 0849   GFRNONAA >60 05/22/2018 1158   GFRAA >60 06/17/2018 0849   GFRAA >60 05/22/2018 1158    No results found for: TOTALPROTELP, ALBUMINELP, A1GS, A2GS, BETS, BETA2SER, GAMS, MSPIKE, SPEI  No results found for: KPAFRELGTCHN, LAMBDASER, KAPLAMBRATIO  Lab Results  Component Value Date   WBC 8.9 12/18/2019   NEUTROABS 5.4 12/18/2019   HGB 15.2 (H) 12/18/2019   HCT 44.1 12/18/2019   MCV 91.5 12/18/2019   PLT 236 12/18/2019   No results found for: LABCA2  No components found for:  [id number]  No results for input(s): INR in the last 168 hours.  No results found for: LABCA2  No results found for: QJJ941  No results found for: DEY814  No results found for: GYJ856  No results found for: CA2729  No components found for: HGQUANT  No results found for: CEA1 / No results found for: CEA1   No results found for: AFPTUMOR  No results found for: CHROMOGRNA  No results found for: HGBA, HGBA2QUANT, HGBFQUANT, HGBSQUAN (Hemoglobinopathy evaluation)   No results found for: LDH  No results found for: IRON, TIBC, IRONPCTSAT (Iron and TIBC)  No results found for: FERRITIN  Urinalysis No results found for: COLORURINE, APPEARANCEUR, LABSPEC, PHURINE, GLUCOSEU, HGBUR, BILIRUBINUR, KETONESUR, PROTEINUR, UROBILINOGEN, NITRITE, LEUKOCYTESUR   STUDIES:  No results found.   ELIGIBLE FOR AVAILABLE RESEARCH PROTOCOL: no   ASSESSMENT: 39 y.o. [facility] woman, status post left breast biopsy x2 on 05/07/2018, showing (a) left breast upper inner quadrant 2 cm from the nipple: a clinical T1b N0, stage IA invasive ductal carcinoma, grade 1, estrogen and progesterone receptor strongly positive, HER-2 not amplified, with an MIB-1 of 10%; strongly E-cadherin positive (b) left breast upper inner quadrant 3 cm from the nipple, a clinical T1c N0, stage IA invasive ductal carcinoma, grade 2, estrogen receptor positive with moderate staining intensity, progesterone receptor positive with strong staining intensity, with an MIB-1 of 10%, and no HER-2 amplification; faint but positive E-cadherin stain  (1) genetics testing 05/28/2018 through the Multi-Cancer Panel offered by [facility] showed no deleterious mutations in AIP, ALK, APC, ATM, AXIN2, BAP1, BARD1, BLM, BMPR1A, BRCA1, BRCA2, BRIP1, BUB1B, CASR, CDC73,  CDH1, CDK4, CDKN1B, CDKN1C, CDKN2A, CEBPA, CEP57, CHEK2, CTNNA1, DICER1, DIS3L2, EGFR, ENG, EPCAM, FH, FLCN, GALNT12, GATA2, GPC3, GREM1, HOXB13, HRAS, KIT, MAX, MEN1,  MET, MITF, MLH1, MLH3, MSH2, MSH3, MSH6, MUTYH, NBN, NF1, NF2, NTHL1, PALB2, PDGFRA, PHOX2B, PMS2, POLD1, POLE, POT1, PRKAR1A, PTCH1, PTEN, RAD50, RAD51C, RAD51D, RB1, RECQL4, RET, RNF43, RPS20, RUNX1, SDHA, SDHAF2, SDHB, SDHC, SDHD, SMAD4, SMARCA4, SMARCB1, SMARCE1, STK11, SUFU, TERC, TERT, TMEM127, TP53, TSC1, TSC2, VHL, WRN, WT1  (2) status post left lumpectomy and sentinel lymph node sampling 06/24/2018 for a pT2 pN1, stage IIA invasive ductal carcinoma, grade 1, with negative margins  (a) a total of 2 sentinel lymph nodes were removed  (3) MammaPrint read as low risk, with an average 10-year risk of recurrence with no treatment of 10%, the 5-year disease-free survival being 97.8% with hormone treatment alone  (4) adjuvant radiation 07/31/2018 - 09/17/2018  (a) Left Breast and IM nodes/ 50.4 Gy in 28 fractions of 1.8 Gy  (b) Left post axilla/ supraclavicular / 50.4 Gy in 28 fractions of 1.8 Gy  (c) Boost / 10 Gy in 5 fractions of 2 Gy  (5) anastrozole started 09/19/2018  (a) goserelin started 09/25/2018, continue monthly  (b) goserelin changed to every 3 months beginning 12/18/2019   PLAN: [place] is now 1-1/2 years out from definitive surgery for her breast cancer with no evidence of disease recurrence.  This is very favorable.  She is tolerating the anastrozole generally well.  Hot flashes have become better.  Vaginal dryness is not a major issue.  The goserelin is inconvenient because she has to come here monthly and if there is a very high co-pay including a facility speed.  She ends up paying about $200 a dose.  Today we discussed the option of her going on every 24-monthshots and I have written for her to receive the first 1 today.  The other option is for me to write for her to stop the goserelin from her own pharmacy and have one of her nurse friends give it to her.  She will check with her insurance as far as that is concerned.    [place]  I  explained to her that I do not have experience with that medication and I would not be comfortable writing it for her.  I did give her a no carb diet that she can try for a month or 2.  I suspect she would lose quite a bit of weight if she follows.  We also discussed exercise issues  Overall though I am very pleased with how well Breanna [name] doing.  She is going to return to see me in 6 months.  He knows to call for any other issue that may develop before then.  Total encounter time 30 minutes.*   Nelvin Tomb, GVirgie Dad MD  12/18/19 2:57 PM Medical Oncology and H[facility] [place] [id number]. [phone]   Fax. [phone]   I, KWilburn Mylar am acting as scribe for Dr. GVirgie Dad Breanna [name].  I, GLurline DelMD, have reviewed the above documentation for accuracy and completeness, and I agree with the above.   *Total Encounter Time as defined by the Centers for Medicare and Medicaid Services includes, in addition to the face-to-face time of a patient visit (documented in the note above) non-face-to-face time: obtaining and reviewing outside history, ordering and reviewing medications, tests or procedures, care coordination (communications with other  health care professionals or caregivers) and documentation in the medical record.

## 2019-12-19 ENCOUNTER — Telehealth: Payer: Self-pay | Admitting: Oncology

## 2019-12-19 NOTE — Telephone Encounter (Signed)
Scheduled appts per 6/3 los. Pt confirmed appt dates and times.  

## 2020-01-15 ENCOUNTER — Ambulatory Visit: Payer: 59

## 2020-02-11 ENCOUNTER — Other Ambulatory Visit: Payer: Self-pay | Admitting: Oncology

## 2020-02-12 ENCOUNTER — Ambulatory Visit: Payer: 59

## 2020-03-09 ENCOUNTER — Telehealth: Payer: Self-pay | Admitting: *Deleted

## 2020-03-09 ENCOUNTER — Encounter: Payer: Self-pay | Admitting: Oncology

## 2020-03-09 NOTE — Telephone Encounter (Signed)
This RN spoke with pt per her call asking about the possibility of obtaining the zoladex from her local pharmacy so a nurse friend could administer due to cost concerns.  She states her out of pocket cost is approximately $200 a month and with the last injection being $630 for the 3 month dose ( so about the same ).  She states the cost of the medication is the greatest portion of cost ( with last injection the cost was $2400) With facility/nurse procedure $76 And lab fee of $311  She states she understands the need for the drug and why she is getting it- she is just trying to see how to afford her care ( she is currently in debt from her medical bills ).  This RN sent above information to our phx and PA staff to see what resources are available for this patient.  Pt is aware that this RN is looking into resources.

## 2020-03-09 NOTE — Progress Notes (Signed)
Enrolled pt in the Zoladex Savings Card program w/ [employer] for a maximum of $300 per 1 month supply and $900 per 3 month supply with a maximum annual benefit of $2,000 per calendar year with a look back period of 90 days.  Notified pt of the approval.

## 2020-03-11 ENCOUNTER — Encounter: Payer: Self-pay | Admitting: Oncology

## 2020-03-11 ENCOUNTER — Other Ambulatory Visit: Payer: Self-pay

## 2020-03-11 ENCOUNTER — Inpatient Hospital Stay: Payer: 59 | Attending: Oncology

## 2020-03-11 ENCOUNTER — Inpatient Hospital Stay: Payer: 59

## 2020-03-11 VITALS — BP 125/87 | HR 95 | Resp 18

## 2020-03-11 DIAGNOSIS — Z170 Estrogen receptor positive status [ER+]: Secondary | ICD-10-CM | POA: Insufficient documentation

## 2020-03-11 DIAGNOSIS — C50812 Malignant neoplasm of overlapping sites of left female breast: Secondary | ICD-10-CM | POA: Diagnosis present

## 2020-03-11 DIAGNOSIS — Z5111 Encounter for antineoplastic chemotherapy: Secondary | ICD-10-CM | POA: Diagnosis not present

## 2020-03-11 MED ORDER — GOSERELIN ACETATE 3.6 MG ~~LOC~~ IMPL
10.8000 mg | DRUG_IMPLANT | Freq: Once | SUBCUTANEOUS | Status: AC
  Administered 2020-03-11: 10.8 mg via SUBCUTANEOUS

## 2020-03-11 MED ORDER — GOSERELIN ACETATE 3.6 MG ~~LOC~~ IMPL
DRUG_IMPLANT | SUBCUTANEOUS | Status: AC
  Filled 2020-03-11: qty 3.6

## 2020-03-11 NOTE — Patient Instructions (Signed)

## 2020-03-11 NOTE — Progress Notes (Signed)
Received call from Seth Bake in the pharmacy regarding Zoladex Savings Program.  Read information from welcome letter in Lenise's book on how to proceed with reimbursement.

## 2020-03-16 ENCOUNTER — Other Ambulatory Visit: Payer: Self-pay | Admitting: Oncology

## 2020-04-08 ENCOUNTER — Ambulatory Visit: Payer: 59

## 2020-04-16 ENCOUNTER — Other Ambulatory Visit: Payer: Self-pay | Admitting: Oncology

## 2020-04-16 DIAGNOSIS — Z853 Personal history of malignant neoplasm of breast: Secondary | ICD-10-CM

## 2020-05-10 ENCOUNTER — Encounter: Payer: Self-pay | Admitting: Oncology

## 2020-06-03 ENCOUNTER — Inpatient Hospital Stay: Payer: 59 | Attending: Oncology

## 2020-06-03 ENCOUNTER — Inpatient Hospital Stay (HOSPITAL_BASED_OUTPATIENT_CLINIC_OR_DEPARTMENT_OTHER): Payer: 59 | Admitting: Oncology

## 2020-06-03 ENCOUNTER — Other Ambulatory Visit: Payer: Self-pay

## 2020-06-03 ENCOUNTER — Inpatient Hospital Stay: Payer: 59

## 2020-06-03 VITALS — BP 121/82 | HR 92 | Temp 98.2°F | Resp 18

## 2020-06-03 DIAGNOSIS — C50812 Malignant neoplasm of overlapping sites of left female breast: Secondary | ICD-10-CM | POA: Diagnosis not present

## 2020-06-03 DIAGNOSIS — R232 Flushing: Secondary | ICD-10-CM | POA: Insufficient documentation

## 2020-06-03 DIAGNOSIS — Z8052 Family history of malignant neoplasm of bladder: Secondary | ICD-10-CM | POA: Diagnosis not present

## 2020-06-03 DIAGNOSIS — Z170 Estrogen receptor positive status [ER+]: Secondary | ICD-10-CM

## 2020-06-03 DIAGNOSIS — Z801 Family history of malignant neoplasm of trachea, bronchus and lung: Secondary | ICD-10-CM | POA: Diagnosis not present

## 2020-06-03 DIAGNOSIS — J45909 Unspecified asthma, uncomplicated: Secondary | ICD-10-CM | POA: Insufficient documentation

## 2020-06-03 DIAGNOSIS — Z79811 Long term (current) use of aromatase inhibitors: Secondary | ICD-10-CM | POA: Insufficient documentation

## 2020-06-03 DIAGNOSIS — Z923 Personal history of irradiation: Secondary | ICD-10-CM | POA: Diagnosis not present

## 2020-06-03 DIAGNOSIS — Z79899 Other long term (current) drug therapy: Secondary | ICD-10-CM | POA: Insufficient documentation

## 2020-06-03 DIAGNOSIS — M129 Arthropathy, unspecified: Secondary | ICD-10-CM | POA: Insufficient documentation

## 2020-06-03 DIAGNOSIS — K219 Gastro-esophageal reflux disease without esophagitis: Secondary | ICD-10-CM | POA: Insufficient documentation

## 2020-06-03 DIAGNOSIS — C50212 Malignant neoplasm of upper-inner quadrant of left female breast: Secondary | ICD-10-CM | POA: Insufficient documentation

## 2020-06-03 DIAGNOSIS — G932 Benign intracranial hypertension: Secondary | ICD-10-CM

## 2020-06-03 DIAGNOSIS — Z5111 Encounter for antineoplastic chemotherapy: Secondary | ICD-10-CM | POA: Diagnosis not present

## 2020-06-03 DIAGNOSIS — Z800 Family history of malignant neoplasm of digestive organs: Secondary | ICD-10-CM | POA: Insufficient documentation

## 2020-06-03 DIAGNOSIS — F419 Anxiety disorder, unspecified: Secondary | ICD-10-CM | POA: Diagnosis not present

## 2020-06-03 DIAGNOSIS — Z6841 Body Mass Index (BMI) 40.0 and over, adult: Secondary | ICD-10-CM

## 2020-06-03 DIAGNOSIS — E669 Obesity, unspecified: Secondary | ICD-10-CM | POA: Insufficient documentation

## 2020-06-03 LAB — COMPREHENSIVE METABOLIC PANEL
ALT: 47 U/L — ABNORMAL HIGH (ref 0–44)
AST: 33 U/L (ref 15–41)
Albumin: 3.5 g/dL (ref 3.5–5.0)
Alkaline Phosphatase: 77 U/L (ref 38–126)
Anion gap: 7 (ref 5–15)
BUN: 9 mg/dL (ref 6–20)
CO2: 24 mmol/L (ref 22–32)
Calcium: 9 mg/dL (ref 8.9–10.3)
Chloride: 110 mmol/L (ref 98–111)
Creatinine, Ser: 1.31 mg/dL — ABNORMAL HIGH (ref 0.44–1.00)
GFR, Estimated: 53 mL/min — ABNORMAL LOW (ref >60)
Glucose, Bld: 120 mg/dL — ABNORMAL HIGH (ref 70–99)
Potassium: 4 mmol/L (ref 3.5–5.1)
Sodium: 141 mmol/L (ref 135–145)
Total Bilirubin: 0.6 mg/dL (ref 0.3–1.2)
Total Protein: 7.1 g/dL (ref 6.5–8.1)

## 2020-06-03 LAB — CBC WITH DIFFERENTIAL/PLATELET
Abs Immature Granulocytes: 0.03 10*3/uL (ref 0.00–0.07)
Basophils Absolute: 0 10*3/uL (ref 0.0–0.1)
Basophils Relative: 0 %
Eosinophils Absolute: 0.3 10*3/uL (ref 0.0–0.5)
Eosinophils Relative: 3 %
HCT: 44.1 % (ref 36.0–46.0)
Hemoglobin: 15.1 g/dL — ABNORMAL HIGH (ref 12.0–15.0)
Immature Granulocytes: 0 %
Lymphocytes Relative: 20 %
Lymphs Abs: 1.8 10*3/uL (ref 0.7–4.0)
MCH: 32.2 pg (ref 26.0–34.0)
MCHC: 34.2 g/dL (ref 30.0–36.0)
MCV: 94 fL (ref 80.0–100.0)
Monocytes Absolute: 0.6 10*3/uL (ref 0.1–1.0)
Monocytes Relative: 6 %
Neutro Abs: 6.6 10*3/uL (ref 1.7–7.7)
Neutrophils Relative %: 71 %
Platelets: 246 10*3/uL (ref 150–400)
RBC: 4.69 MIL/uL (ref 3.87–5.11)
RDW: 12.8 % (ref 11.5–15.5)
WBC: 9.3 10*3/uL (ref 4.0–10.5)
nRBC: 0 % (ref 0.0–0.2)

## 2020-06-03 MED ORDER — GOSERELIN ACETATE 3.6 MG ~~LOC~~ IMPL: 10.8000 mg | DRUG_IMPLANT | Freq: Once | SUBCUTANEOUS | Status: DC

## 2020-06-03 MED ORDER — GOSERELIN ACETATE 10.8 MG ~~LOC~~ IMPL
10.8000 mg | DRUG_IMPLANT | Freq: Once | SUBCUTANEOUS | Status: AC
  Administered 2020-06-03: 10.8 mg via SUBCUTANEOUS
  Filled 2020-06-03: qty 10.8

## 2020-06-03 NOTE — Progress Notes (Signed)
[place]  [phone] [phone]    ID: Chasty Randal Wagar DOB: [birth year]  MR#: [id number]  XMI#:[id number]  Patient Care Team: Ginger Organ as PCP - General (Physician Assistant) Danie Binder, MD (Inactive) as Consulting Physician (Gastroenterology) Makenzey Nanni, Virgie Dad, MD as Consulting Physician (Oncology) Eppie Gibson, MD as Attending Physician (Radiation Oncology) Fanny Skates, MD as Consulting Physician (General Surgery) Estill Dooms, NP as Nurse Practitioner (Obstetrics and Gynecology) OTHER MD:   CHIEF COMPLAINT: Estrogen receptor positive multifocal breast cancer  CURRENT TREATMENT: goserelin/Zoladex; anastrozole   INTERVAL HISTORY: Nataliya returns today for follow-up of her estrogen receptor positive multifocal breast cancer.  She is accompanied by her husband Donnie.  She continues on anastrozole.  She continues to have some issues with hot flashes.  Otherwise she tolerates this well.  Her most recent bone density on 05/19/2019 showed a Z score of 1.4, which is expected for 39 years old.  She also receives goserelin every 12 weeks.  She now has some financial support and only pays $40 or so out-of-pocket for each dose.  She is scheduled for annual mammography on 06/14/2020.  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  She also goes to school and is taking multiple sinus courses at present.  She wants to get an LPN license.  A detailed review of systems today was otherwise stable   COVID 19 VACCINATION STATUS: fully vaccinated Levan Hurst) with no booster as of November 2021.   HISTORY OF CURRENT ILLNESS: From the original intake note:  Yumiko Alkins Gerstenberger noted a palpable abnormality in the 4 o'clock location of the LEFT breast sometime in September 2019.  She  brought it up to medical attention and underwent bilateral diagnostic mammography with tomography and left breast ultrasonography at [facility] on 04/23/2018 showing: a Suspicious mass in the 9:30 o'clock location of the LEFT breast 2 centimeters from the nipple; by ultrasound this was an irregular hypoechoic mass with spiculated margins and posterior acoustic shadowing which measures 0.5 x 0.7 x 1.1 centimeters. A second similar lesion is also suspected in the same portion of the breast but has not been completely evaluated. Recommend targeted ultrasound of the MEDIAL aspect of the LEFT breast at the time of patient's biopsy and possible second biopsy. LEFT axilla is negative for adenopathy. The areas of clinical concern in the LOWER OUTER QUADRANT of the LEFT breast in the [place] 10 centimeters from the nipple are negative by mammogram and ultrasound.  Accordingly on 05/07/2018 the patient proceeded to biopsy of the left breast areas in question. The pathology from this procedure showed ([id number]):  1. Breast, left, needle core biopsy, at 9:30 o'clock 3 cm from the nipple: invasive mammary carcinoma, grade II. Mammary carcinoma in situ.  2. Breast, left, needle core biopsy, 9:30 o'clock 2 cm from the nipple with invasive ductal carcinoma, grade I. Mammary carcinoma in situ.  E-cadherin stains are faint in the larger tumor but still positive, strong in the second tumor, and thus these are read as ductal.  Prognostic indicators significant for:  1. Estrogen receptor, 80% positive, moderate staining intensity and progesterone receptor, 80% positive, strong staining intensity. Proliferation marker Ki67 at 10%. HER2 negative by immunohistochemistry, 1+.   2. Estrogen Receptor: 90%, positive, strong staining intensity and progesterone Receptor: 90%, positive, strong staining  intensity. Proliferation Marker Ki67: 10%. HER2 negative by immunohistochemistry, 1+.    The patient's subsequent  history is as detailed below.   PAST MEDICAL HISTORY: Past Medical History:  Diagnosis Date  . Anxiety   . Arthritis    knees  . Asthma   . Breast cancer ([place]) 05/2018   left   . Cluster headaches   . Cough 06/11/2018  . Dental crown present   . Family history of bladder cancer   . Family history of kidney cancer   . Family history of lung cancer   . Family history of stomach cancer   . GERD (gastroesophageal reflux disease)   . Heart palpitations    occasional - no cardiologist  . History of asthma    as a child - prn inhaler  . History of radiation therapy 07/31/18- 09/17/18   Left Breast / 50.4 Gy in 28 fractions of 1.8 Gy, Left Supraclavicular / 50.4 Gy in 28 fractions of 1.8 Gy, and boost of 10 Gy in 5 fractions of 2 Gy each.   . Obesity   . Personal history of radiation therapy 07/2018  . Pseudotumor cerebri syndrome   . Seasonal allergies   . Stuffy nose 06/11/2018    PAST SURGICAL HISTORY: Past Surgical History:  Procedure Laterality Date  . BIOPSY  11/08/2015   Procedure: BIOPSY;  Surgeon: Danie Binder, MD;  [facility];  Service: Endoscopy;;  Gastric bx and Gastric polyp bx  . BREAST BIOPSY Left 05/07/2018   x2  . BREAST LUMPECTOMY Left 06/24/2018  . BREAST LUMPECTOMY WITH RADIOACTIVE SEED AND SENTINEL LYMPH NODE BIOPSY Left 06/24/2018   Procedure: LEFT BREAST LUMPECTOMY WITH  BRACKETED RADIOACTIVE SEED AND LEFT AXILLARY DEEP SENTINEL LYMPH NODE BIOPSY AND BLUE DYE INJECTION;  Surgeon: Fanny Skates, MD;  Location: [facility];  Service: General;  Laterality: Left;  . CESAREAN SECTION  11/03/2003  . CESAREAN SECTION  05/15/2006  . CHOLECYSTECTOMY    . COLONOSCOPY N/A 11/08/2015   Procedure: COLONOSCOPY;  Surgeon: Danie Binder, MD;  [facility];  Service: Endoscopy;  Laterality: N/A;  1000  . ESOPHAGOGASTRODUODENOSCOPY N/A 11/08/2015   Procedure: ESOPHAGOGASTRODUODENOSCOPY (EGD);  Surgeon: Danie Binder, MD;  [facility];  Service: Endoscopy;  Laterality: N/A;  . HEMORRHOID BANDING N/A 11/08/2015   Procedure: HEMORRHOID BANDING;  Surgeon: Danie Binder, MD;  [facility];  Service: Endoscopy;  Laterality: N/A;  . SAVORY DILATION N/A 11/08/2015   Procedure: SAVORY DILATION;  Surgeon: Danie Binder, MD;  [facility];  Service: Endoscopy;  Laterality: N/A;  . WISDOM TOOTH EXTRACTION      FAMILY HISTORY: Family History  Problem Relation Age of Onset  . COPD Mother   . Pneumonia Mother   . Hyperlipidemia Father   . Kidney cancer Father 60       "simple renal cell carcinoma"  . Heart disease Maternal Aunt   . Hypertension Maternal Grandmother   . Diabetes Maternal Grandmother   . Stroke Paternal Grandmother   . Hypertension Paternal Grandmother   . Diabetes Paternal Grandmother   . Stomach cancer Paternal Grandmother 46       had a portion of stomah removed  . Diabetes Paternal Grandfather   . Heart disease Paternal Grandfather   . Hyperlipidemia Paternal Grandfather   . Hypertension Paternal Grandfather   . Bladder Cancer Maternal Uncle        hx smoking  .  Lung cancer Other    As of November 2019 her father is 49 years old. Patients' mother died from pneumonia at age 50. The patient has 0 brothers and 0 sisters. Patient denies any family story of ovarian or breast cancer. Her paternal grandmother had gastric cancer in 2000. Her paternal aunt had lung cancer without a hx of smoking. Her maternal uncle has had bladder cancer.    GYNECOLOGIC HISTORY:  No LMP recorded. (Menstrual status: Chemotherapy). Menarche: 39 years old Age at first live birth: 39 years old [place] P2 LMP: No Contraceptive: Implanon in place.  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 Her husband Letitia Libra works for the [employer] as an Mining engineer. Her two children are a son 56 who hopes to be a  Land and daughter 15 years old.  The patient attends full [employer] in [place].   ADVANCED DIRECTIVES: In the absence of any documents to the contrary the patient's husband is her healthcare power of attorney   HEALTH MAINTENANCE: Social History   Tobacco Use  . Smoking status: Never Smoker  . Smokeless tobacco: Never Used  Vaping Use  . Vaping Use: Never used  Substance Use Topics  . Alcohol use: No  . Drug use: No     Colonoscopy: 2017  PAP: 2017  Bone density: No   Allergies  Allergen Reactions  . Adhesive [Tape] Other (See Comments)    BLISTERS  . Latex Hives and Itching  . Topamax [Topiramate] Other (See Comments)    ALTERED MENTAL STATUS  . Celecoxib Palpitations  . Codeine Itching and Rash  . Penicillins Itching and Rash       . Sulfa Antibiotics Itching and Rash    Current Outpatient Medications  Medication Sig Dispense Refill  . acetaZOLAMIDE (DIAMOX) 500 MG capsule Take 500 mg by mouth daily.    Marland Kitchen albuterol (PROVENTIL HFA;VENTOLIN HFA) 108 (90 Base) MCG/ACT inhaler Inhale into the lungs every 6 (six) hours as needed for wheezing or shortness of breath.    . anastrozole (ARIMIDEX) 1 MG tablet TAKE 1 TABLET BY MOUTH ONCE A DAY. 90 tablet 0  . busPIRone (BUSPAR) 7.5 MG tablet Take 7.5 mg by mouth daily.     . cetirizine (ZYRTEC) 10 MG tablet Take 10 mg by mouth daily.    . Cyanocobalamin (VITAMIN B 12) 500 MCG TABS Take by mouth.    . gabapentin (NEURONTIN) 300 MG capsule Take 1 capsule (300 mg total) by mouth at bedtime. 90 capsule 4  . pantoprazole (PROTONIX) 40 MG tablet Take 40 mg by mouth daily.    . Vitamin D, Ergocalciferol, 50 MCG (2000 UT) CAPS Take by mouth. 30 capsule   . zonisamide (ZONEGRAN) 25 MG capsule Take 50 mg by mouth daily.      No current facility-administered medications for this visit.    OBJECTIVE: white woman who appears stated age  There were no vitals filed for this visit.   There is no height or weight on  file to calculate BMI.   Wt Readings from Last 3 Encounters:  12/18/19 265 lb 9.6 oz (120.5 kg)  07/03/19 262 lb 12.8 oz (119.2 kg)  02/25/19 250 lb 9.6 oz (113.7 kg)      ECOG FS:1 - Symptomatic but completely ambulatory  Sclerae unicteric, EOMs intact Wearing a mask No cervical or supraclavicular adenopathy Lungs no rales  or rhonchi Heart regular rate and rhythm Abd soft, nontender, positive bowel sounds MSK no focal spinal tenderness, no upper extremity lymphedema Neuro: nonfocal, well oriented, appropriate affect Breasts: The right breast is benign.  The left breast is status post lumpectomy and radiation.  The cosmetic result is good.  There is no evidence of local recurrence.  Both axillae are benign.   LAB RESULTS:  CMP     Component Value Date/Time   NA 141 06/03/2020 1434   K 4.0 06/03/2020 1434   CL 110 06/03/2020 1434   CO2 24 06/03/2020 1434   GLUCOSE 120 (H) 06/03/2020 1434   BUN 9 06/03/2020 1434   CREATININE 1.31 (H) 06/03/2020 1434   CREATININE 1.05 (H) 05/22/2018 1158   CREATININE 1.08 08/22/2013 1039   CALCIUM 9.0 06/03/2020 1434   PROT 7.1 06/03/2020 1434   ALBUMIN 3.5 06/03/2020 1434   AST 33 06/03/2020 1434   AST 19 05/22/2018 1158   ALT 47 (H) 06/03/2020 1434   ALT 25 05/22/2018 1158   ALKPHOS 77 06/03/2020 1434   BILITOT 0.6 06/03/2020 1434   BILITOT 0.6 05/22/2018 1158   GFRNONAA 53 (L) 06/03/2020 1434   GFRNONAA >60 05/22/2018 1158   GFRAA 57 (L) 12/18/2019 1410   GFRAA >60 05/22/2018 1158    No results found for: TOTALPROTELP, ALBUMINELP, A1GS, A2GS, BETS, BETA2SER, GAMS, MSPIKE, SPEI  No results found for: KPAFRELGTCHN, LAMBDASER, KAPLAMBRATIO  Lab Results  Component Value Date   WBC 9.3 06/03/2020   NEUTROABS 6.6 06/03/2020   HGB 15.1 (H) 06/03/2020   HCT 44.1 06/03/2020   MCV 94.0 06/03/2020   PLT 246 06/03/2020   No results found for: LABCA2  No components found for: WGNFAO130  No results for input(s): INR in the last  168 hours.  No results found for: LABCA2  No results found for: QMV784  No results found for: ONG295  No results found for: MWU132  No results found for: CA2729  No components found for: HGQUANT  No results found for: CEA1 / No results found for: CEA1   No results found for: AFPTUMOR  No results found for: CHROMOGRNA  No results found for: HGBA, HGBA2QUANT, HGBFQUANT, HGBSQUAN (Hemoglobinopathy evaluation)   No results found for: LDH  No results found for: IRON, TIBC, IRONPCTSAT (Iron and TIBC)  No results found for: FERRITIN  Urinalysis No results found for: COLORURINE, APPEARANCEUR, LABSPEC, PHURINE, GLUCOSEU, HGBUR, BILIRUBINUR, KETONESUR, PROTEINUR, UROBILINOGEN, NITRITE, LEUKOCYTESUR   STUDIES:  No results found.   ELIGIBLE FOR AVAILABLE RESEARCH PROTOCOL: no   ASSESSMENT: 39 y.o. [facility] woman, status post left breast biopsy x2 on 05/07/2018, showing (a) left breast upper inner quadrant 2 cm from the nipple: a clinical T1b N0, stage IA invasive ductal carcinoma, grade 1, estrogen and progesterone receptor strongly positive, HER-2 not amplified, with an MIB-1 of 10%; strongly E-cadherin positive (b) left breast upper inner quadrant 3 cm from the nipple, a clinical T1c N0, stage IA invasive ductal carcinoma, grade 2, estrogen receptor positive with moderate staining intensity, progesterone receptor positive with strong staining intensity, with an MIB-1 of 10%, and no HER-2 amplification; faint but positive E-cadherin stain  (1) genetics testing 05/28/2018 through the Multi-Cancer Panel offered by [facility] showed no deleterious mutations in AIP, ALK, APC, ATM, AXIN2, BAP1, BARD1, BLM, BMPR1A, BRCA1, BRCA2, BRIP1, BUB1B, CASR, CDC73, CDH1, CDK4, CDKN1B, CDKN1C, CDKN2A, CEBPA, CEP57, CHEK2, CTNNA1, DICER1, DIS3L2, EGFR, ENG, EPCAM, FH, FLCN, GALNT12, GATA2, GPC3, GREM1, HOXB13, HRAS, KIT, MAX, MEN1,  MET, MITF, MLH1, MLH3, MSH2, MSH3, MSH6, MUTYH, NBN,  NF1, NF2, NTHL1, PALB2, PDGFRA, PHOX2B, PMS2, POLD1, POLE, POT1, PRKAR1A, PTCH1, PTEN, RAD50, RAD51C, RAD51D, RB1, RECQL4, RET, RNF43, RPS20, RUNX1, SDHA, SDHAF2, SDHB, SDHC, SDHD, SMAD4, SMARCA4, SMARCB1, SMARCE1, STK11, SUFU, TERC, TERT, TMEM127, TP53, TSC1, TSC2, VHL, WRN, WT1  (2) status post left lumpectomy and sentinel lymph node sampling 06/24/2018 for a pT2 pN1, stage IIA invasive ductal carcinoma, grade 1, with negative margins  (a) a total of 2 sentinel lymph nodes were removed  (3) MammaPrint read as low risk, with an average 10-year risk of recurrence with no treatment of 10%, the 5-year disease-free survival being 97.8% with hormone treatment alone  (4) adjuvant radiation 07/31/2018 - 09/17/2018  (a) Left Breast and IM nodes/ 50.4 Gy in 28 fractions of 1.8 Gy  (b) Left post axilla/ supraclavicular / 50.4 Gy in 28 fractions of 1.8 Gy  (c) Boost / 10 Gy in 5 fractions of 2 Gy  (5) anastrozole started 09/19/2018  (a) goserelin started 09/25/2018, initially monthly  (b) goserelin changed to every 3 months beginning 12/18/2019   PLAN: Tamico is now 2 years out from definitive surgery for her breast cancer with no evidence of disease recurrence.  This is very favorable.  She is tolerating the goserelin and anastrozole well and the plan is to continue that a total of 5 years.  Her most recent bone density scan was normal.  I have suggested a more regular exercise program for her.  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  Total encounter time 25 minutes.*  Cotina Freedman, Virgie Dad, MD  06/03/20 6:07 PM Medical Oncology and Hematology [facility] [place], [place] [address] Tel. [phone]    Fax. [phone]    I, Wilburn Mylar, am acting as scribe for Dr. Virgie Dad. Khiya Friese.  I, Lurline Del MD, have  reviewed the above documentation for accuracy and completeness, and I agree with the above.   *Total Encounter Time as defined by the Centers for Medicare and Medicaid Services includes, in addition to the face-to-face time of a patient visit (documented in the note above) non-face-to-face time: obtaining and reviewing outside history, ordering and reviewing medications, tests or procedures, care coordination (communications with other health care professionals or caregivers) and documentation in the medical record.

## 2020-06-07 ENCOUNTER — Telehealth: Payer: Self-pay | Admitting: Oncology

## 2020-06-07 NOTE — Telephone Encounter (Signed)
Scheduled appts per 11/18 los. Pt confirmed appt dates and times.

## 2020-06-14 ENCOUNTER — Other Ambulatory Visit: Payer: Self-pay

## 2020-06-14 ENCOUNTER — Ambulatory Visit
Admission: RE | Admit: 2020-06-14 | Discharge: 2020-06-14 | Disposition: A | Discharge status: Home / Self Care | Payer: 59 | Source: Ambulatory Visit | Attending: Oncology | Admitting: Oncology

## 2020-06-14 DIAGNOSIS — Z853 Personal history of malignant neoplasm of breast: Secondary | ICD-10-CM

## 2020-06-27 ENCOUNTER — Other Ambulatory Visit: Payer: Self-pay | Admitting: Oncology

## 2020-08-11 ENCOUNTER — Other Ambulatory Visit: Payer: Self-pay | Admitting: Oncology

## 2020-09-03 ENCOUNTER — Other Ambulatory Visit: Payer: Self-pay

## 2020-09-03 ENCOUNTER — Inpatient Hospital Stay: Payer: 59

## 2020-09-03 ENCOUNTER — Inpatient Hospital Stay: Payer: 59 | Attending: Oncology

## 2020-09-03 VITALS — BP 123/89 | HR 78 | Resp 18

## 2020-09-03 DIAGNOSIS — C50212 Malignant neoplasm of upper-inner quadrant of left female breast: Secondary | ICD-10-CM | POA: Diagnosis present

## 2020-09-03 DIAGNOSIS — Z5111 Encounter for antineoplastic chemotherapy: Secondary | ICD-10-CM | POA: Insufficient documentation

## 2020-09-03 DIAGNOSIS — C50812 Malignant neoplasm of overlapping sites of left female breast: Secondary | ICD-10-CM

## 2020-09-03 DIAGNOSIS — Z170 Estrogen receptor positive status [ER+]: Secondary | ICD-10-CM | POA: Insufficient documentation

## 2020-09-03 LAB — CBC WITH DIFFERENTIAL/PLATELET
Abs Immature Granulocytes: 0.02 10*3/uL (ref 0.00–0.07)
Basophils Absolute: 0.1 10*3/uL (ref 0.0–0.1)
Basophils Relative: 1 %
Eosinophils Absolute: 0.4 10*3/uL (ref 0.0–0.5)
Eosinophils Relative: 5 %
HCT: 44.6 % (ref 36.0–46.0)
Hemoglobin: 15.4 g/dL — ABNORMAL HIGH (ref 12.0–15.0)
Immature Granulocytes: 0 %
Lymphocytes Relative: 23 %
Lymphs Abs: 1.8 10*3/uL (ref 0.7–4.0)
MCH: 31.9 pg (ref 26.0–34.0)
MCHC: 34.5 g/dL (ref 30.0–36.0)
MCV: 92.3 fL (ref 80.0–100.0)
Monocytes Absolute: 0.6 10*3/uL (ref 0.1–1.0)
Monocytes Relative: 7 %
Neutro Abs: 4.8 10*3/uL (ref 1.7–7.7)
Neutrophils Relative %: 64 %
Platelets: 207 10*3/uL (ref 150–400)
RBC: 4.83 MIL/uL (ref 3.87–5.11)
RDW: 12.2 % (ref 11.5–15.5)
WBC: 7.7 10*3/uL (ref 4.0–10.5)
nRBC: 0 % (ref 0.0–0.2)

## 2020-09-03 LAB — COMPREHENSIVE METABOLIC PANEL
ALT: 53 U/L — ABNORMAL HIGH (ref 0–44)
AST: 39 U/L (ref 15–41)
Albumin: 3.7 g/dL (ref 3.5–5.0)
Alkaline Phosphatase: 61 U/L (ref 38–126)
Anion gap: 8 (ref 5–15)
BUN: 13 mg/dL (ref 6–20)
CO2: 23 mmol/L (ref 22–32)
Calcium: 9 mg/dL (ref 8.9–10.3)
Chloride: 108 mmol/L (ref 98–111)
Creatinine, Ser: 1.05 mg/dL — ABNORMAL HIGH (ref 0.44–1.00)
GFR, Estimated: >60 mL/min (ref >60)
Glucose, Bld: 102 mg/dL — ABNORMAL HIGH (ref 70–99)
Potassium: 4 mmol/L (ref 3.5–5.1)
Sodium: 139 mmol/L (ref 135–145)
Total Bilirubin: 0.8 mg/dL (ref 0.3–1.2)
Total Protein: 6.9 g/dL (ref 6.5–8.1)

## 2020-09-03 MED ORDER — GOSERELIN ACETATE 10.8 MG ~~LOC~~ IMPL
10.8000 mg | DRUG_IMPLANT | Freq: Once | SUBCUTANEOUS | Status: AC
  Administered 2020-09-03: 10.8 mg via SUBCUTANEOUS
  Filled 2020-09-03: qty 10.8

## 2020-09-03 NOTE — Patient Instructions (Signed)

## 2020-09-14 ENCOUNTER — Other Ambulatory Visit: Payer: Self-pay | Admitting: Oncology

## 2020-11-14 ENCOUNTER — Encounter: Payer: Self-pay | Admitting: Adult Health

## 2020-11-15 ENCOUNTER — Telehealth: Payer: Self-pay | Admitting: Oncology

## 2020-11-15 NOTE — Telephone Encounter (Signed)
Scheduled appt per 5/2 sch msg. Called pt, no answer. Left msg with appt date and time.  

## 2020-11-22 ENCOUNTER — Ambulatory Visit (INDEPENDENT_AMBULATORY_CARE_PROVIDER_SITE_OTHER): Payer: 59

## 2020-11-22 ENCOUNTER — Other Ambulatory Visit: Payer: Self-pay | Admitting: Podiatry

## 2020-11-22 ENCOUNTER — Ambulatory Visit: Payer: 59 | Admitting: Podiatry

## 2020-11-22 ENCOUNTER — Other Ambulatory Visit: Payer: Self-pay

## 2020-11-22 ENCOUNTER — Encounter: Payer: Self-pay | Admitting: Podiatry

## 2020-11-22 DIAGNOSIS — M79671 Pain in right foot: Secondary | ICD-10-CM

## 2020-11-22 DIAGNOSIS — M722 Plantar fascial fibromatosis: Secondary | ICD-10-CM

## 2020-11-22 MED ORDER — MELOXICAM 15 MG PO TABS
15.0000 mg | ORAL_TABLET | Freq: Every day | ORAL | 3 refills | Status: DC
Start: 2020-11-22 — End: 2022-04-29

## 2020-11-22 NOTE — Patient Instructions (Signed)

## 2020-11-22 NOTE — Progress Notes (Signed)
  Subjective:  Patient ID: Breanna [name], female    DOB: 1981-02-13,  MRN: [id number]  Chief Complaint  Patient presents with  . Foot Pain    C/o Rt foot pain mainly in heel but has radiated through arch and on sides of foot, also reports pain can radiate up foot and through calf. Tx: insoles;[place] shoes; braces.     40 y.o. female presents with the above complaint. History confirmed with patient.   Objective:  Physical Exam: warm, good capillary refill, no trophic changes or ulcerative lesions, normal DP and PT pulses and normal sensory exam.   Right Foot: Sharp pain on palpation to the insertion of the plantar fashion on the plantar heel medially  Radiographs: X-ray of the left foot: no fracture, dislocation, swelling or degenerative changes noted and plantar calcaneal spur Assessment:   1. Plantar fasciitis of right foot      Plan:  Patient was evaluated and treated and all questions answered.   Discussed the etiology and treatment options for plantar fasciitis including stretching, formal physical therapy, supportive shoegears such as a running shoe or sneaker, pre fabricated orthoses, injection therapy, and oral medications. We also discussed the role of surgical treatment of this for patients who do not improve after exhausting non-surgical treatment options.    -XR reviewed with patient -Educated patient on stretching and icing of the affected limb -Night splint dispensed -Injection delivered to the plantar fascia of the right foot. -Rx for meloxicam. Educated on use, risks and benefits of the medication  -Consider physical therapy and a CAM boot at next visit if not improving  After sterile prep with povidone-iodine solution and alcohol, the right heel was injected with 0.5cc 2% xylocaine plain, 0.5cc 0.5% marcaine plain, 5mg  triamcinolone acetonide, and 2mg  dexamethasone was injected along  the plantar fascia at the insertion on the plantar calcaneus. The patient  tolerated the procedure well without complication.  No follow-ups on file.

## 2020-11-28 NOTE — Progress Notes (Signed)
[place]  [phone] Fax:([phone]) [phone]    ID: [name] DOB: 12/01/1980  MR#: [id number]  XAJ#:[id number]  Patient Care Team: Ginger Organ as PCP - General (Physician Assistant) Danie Binder, MD (Inactive) as Consulting Physician (Gastroenterology) Jeweldean Drohan, Virgie Dad, MD as Consulting Physician (Oncology) Eppie Gibson, MD as Attending Physician (Radiation Oncology) Fanny Skates, MD as Consulting Physician (General Surgery) Estill Dooms, NP as Nurse Practitioner (Obstetrics and Gynecology) OTHER MD:   CHIEF COMPLAINT: Estrogen receptor positive multifocal breast cancer  CURRENT TREATMENT: goserelin/Zoladex; anastrozole   INTERVAL HISTORY: [name] returns today for follow-up of her estrogen receptor positive multifocal breast cancer.  She is accompanied by her husband Donnie.  She continues on anastrozole.  She continues to have some issues with hot flashes.  They do not wake her up at night  Her most recent bone density on 05/19/2019 showed a Z score of 1.4, which is expected for age.  She also receives goserelin every 12 weeks.  She now has some financial support and only pays $40 or so out-of-pocket for each dose.  However this together with the anastrozole is causing vaginal dryness issues which are interfering to some extent in her marriage communication  Since her last visit, she underwent bilateral diagnostic mammography with tomography at [facility] on 06/14/2020 showing: breast density category A; no evidence of malignancy in either breast.    REVIEW OF SYSTEMS: [name] is pretty busy, working third shift 5 or 6 days a week at the [employer].  She is not able to exercise regularly partly because of that and partly because she has Planter fasciitis in her right foot.  She has been having more headaches, and a little bit of blurred vision as well.  A detailed review of systems today was otherwise  stable.   COVID 19 VACCINATION STATUS: fully vaccinated Levan Hurst) with no booster as of November 2021.   HISTORY OF CURRENT ILLNESS: From the original intake note:  Breanna [name] noted a palpable abnormality in the 4 o'clock location of the LEFT breast sometime in September 2019.  She brought it up to medical attention and underwent bilateral diagnostic mammography with tomography and left breast ultrasonography at [facility] on 04/23/2018 showing: a Suspicious mass in the 9:30 o'clock location of the LEFT breast 2 centimeters from the nipple; by ultrasound this was an irregular hypoechoic mass with spiculated margins and posterior acoustic shadowing which measures 0.5 x 0.7 x 1.1 centimeters. A second similar lesion is also suspected in the same portion of the breast but has not been completely evaluated. Recommend targeted ultrasound of the MEDIAL aspect of the LEFT breast at the time of patient's biopsy and possible second biopsy. LEFT axilla is negative for adenopathy. The areas of clinical concern in the LOWER OUTER QUADRANT of the LEFT breast in the [place] 10 centimeters from the nipple are negative by mammogram and ultrasound.  Accordingly on 05/07/2018 the patient proceeded to biopsy of the left breast areas in question. The pathology from this procedure showed ([id number]):  1. Breast, left, needle core biopsy, at 9:30 o'clock 3 cm from the nipple: invasive mammary carcinoma, grade II. Mammary carcinoma in situ.  2. Breast, left, needle core biopsy, 9:30 o'clock 2 cm from the nipple with invasive ductal carcinoma, grade I. Mammary carcinoma in situ.  E-cadherin stains are faint in the larger tumor but still positive, strong in the second tumor, and thus these are read as  ductal.  Prognostic indicators significant for:  1. Estrogen receptor, 80% positive, moderate staining intensity and progesterone receptor, 80% positive, strong staining intensity. Proliferation  marker Ki67 at 10%. HER2 negative by immunohistochemistry, 1+.   2. Estrogen Receptor: 90%, positive, strong staining intensity and progesterone Receptor: 90%, positive, strong staining intensity. Proliferation Marker Ki67: 10%. HER2 negative by immunohistochemistry, 1+.    The patient's subsequent history is as detailed below.   PAST MEDICAL HISTORY: Past Medical History:  Diagnosis Date  . Anxiety   . Arthritis    knees  . Asthma   . Breast cancer ([place]) 05/2018   left   . Cluster headaches   . Cough 06/11/2018  . Dental crown present   . Family history of bladder cancer   . Family history of kidney cancer   . Family history of lung cancer   . Family history of stomach cancer   . GERD (gastroesophageal reflux disease)   . Heart palpitations    occasional - no cardiologist  . History of asthma    as a child - prn inhaler  . History of radiation therapy 07/31/18- 09/17/18   Left Breast / 50.4 Gy in 28 fractions of 1.8 Gy, Left Supraclavicular / 50.4 Gy in 28 fractions of 1.8 Gy, and boost of 10 Gy in 5 fractions of 2 Gy each.   . Obesity   . Personal history of radiation therapy 07/2018  . Pseudotumor cerebri syndrome   . Seasonal allergies   . Stuffy nose 06/11/2018    PAST SURGICAL HISTORY: Past Surgical History:  Procedure Laterality Date  . BIOPSY  11/08/2015   Procedure: BIOPSY;  Surgeon: Danie Binder, MD;  [facility];  Service: Endoscopy;;  Gastric bx and Gastric polyp bx  . BREAST BIOPSY Left 05/07/2018   x2  . BREAST LUMPECTOMY Left 06/24/2018  . BREAST LUMPECTOMY WITH RADIOACTIVE SEED AND SENTINEL LYMPH NODE BIOPSY Left 06/24/2018   Procedure: LEFT BREAST LUMPECTOMY WITH  BRACKETED RADIOACTIVE SEED AND LEFT AXILLARY DEEP SENTINEL LYMPH NODE BIOPSY AND BLUE DYE INJECTION;  Surgeon: Fanny Skates, MD;  Location: [facility];  Service: General;  Laterality: Left;  . CESAREAN SECTION  11/03/2003  . CESAREAN SECTION  05/15/2006  .  CHOLECYSTECTOMY    . COLONOSCOPY N/A 11/08/2015   Procedure: COLONOSCOPY;  Surgeon: Danie Binder, MD;  [facility];  Service: Endoscopy;  Laterality: N/A;  1000  . ESOPHAGOGASTRODUODENOSCOPY N/A 11/08/2015   Procedure: ESOPHAGOGASTRODUODENOSCOPY (EGD);  Surgeon: Danie Binder, MD;  [facility];  Service: Endoscopy;  Laterality: N/A;  . HEMORRHOID BANDING N/A 11/08/2015   Procedure: HEMORRHOID BANDING;  Surgeon: Danie Binder, MD;  [facility];  Service: Endoscopy;  Laterality: N/A;  . SAVORY DILATION N/A 11/08/2015   Procedure: SAVORY DILATION;  Surgeon: Danie Binder, MD;  [facility];  Service: Endoscopy;  Laterality: N/A;  . WISDOM TOOTH EXTRACTION      FAMILY HISTORY: Family History  Problem Relation Age of Onset  . COPD Mother   . Pneumonia Mother   . Hyperlipidemia Father   . Kidney cancer Father 52       "simple renal cell carcinoma"  . Heart disease Maternal Aunt   . Hypertension Maternal Grandmother   . Diabetes Maternal Grandmother   . Stroke Paternal Grandmother   . Hypertension Paternal Grandmother   . Diabetes Paternal Grandmother   . Stomach cancer Paternal Grandmother 3  had a portion of stomah removed  . Diabetes Paternal Grandfather   . Heart disease Paternal Grandfather   . Hyperlipidemia Paternal Grandfather   . Hypertension Paternal Grandfather   . Bladder Cancer Maternal Uncle        hx smoking  . Lung cancer Other   As of November 2019 her father is 37 years old. Patients' mother died from pneumonia at age 87. The patient has 0 brothers and 0 sisters. Patient denies any family story of ovarian or breast cancer. Her paternal grandmother had gastric cancer in 2000. Her paternal aunt had lung cancer without a hx of smoking. Her maternal uncle has had bladder cancer.    GYNECOLOGIC HISTORY:  No LMP recorded. (Menstrual status: Chemotherapy). Menarche: 40 years old Age at first live birth: 40 years  old [place] P2 LMP: No Contraceptive: Implanon in place.  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 Her husband Letitia Libra works for the [employer] as an Mining engineer. Her two children are a son 38 who hopes to be a Land and daughter 42 years old.  The patient attends full [employer] in [place].   ADVANCED DIRECTIVES: In the absence of any documents to the contrary the patient's husband is her healthcare power of attorney   HEALTH MAINTENANCE: Social History   Tobacco Use  . Smoking status: Never Smoker  . Smokeless tobacco: Never Used  Vaping Use  . Vaping Use: Never used  Substance Use Topics  . Alcohol use: No  . Drug use: No     Colonoscopy: 2017  PAP: 2017  Bone density: No   Allergies  Allergen Reactions  . Adhesive [Tape] Other (See Comments)    BLISTERS  . Latex Hives and Itching  . Topamax [Topiramate] Other (See Comments)    ALTERED MENTAL STATUS  . Celecoxib Palpitations  . Codeine Itching and Rash  . Penicillins Itching and Rash       . Sulfa Antibiotics Itching and Rash    Current Outpatient Medications  Medication Sig Dispense Refill  . acetaZOLAMIDE (DIAMOX) 500 MG capsule Take 500 mg by mouth daily.    Marland Kitchen albuterol (PROVENTIL HFA;VENTOLIN HFA) 108 (90 Base) MCG/ACT inhaler Inhale into the lungs every 6 (six) hours as needed for wheezing or shortness of breath.    . ALPRAZolam (XANAX) 0.5 MG tablet alprazolam 0.5 mg tablet    . amoxicillin-clavulanate (AUGMENTIN) 875-125 MG tablet amoxicillin 875 mg-potassium clavulanate 125 mg tablet    . anastrozole (ARIMIDEX) 1 MG tablet TAKE 1 TABLET BY MOUTH ONCE A DAY. 90 tablet 0  . azithromycin (ZITHROMAX) 250 MG tablet azithromycin 250 mg tablet    . baclofen (LIORESAL) 10 MG tablet baclofen 10 mg tablet    . benzonatate (TESSALON) 100 MG capsule benzonatate 100 mg capsule    . busPIRone (BUSPAR) 7.5 MG  tablet Take 7.5 mg by mouth daily.     . cetirizine (ZYRTEC) 10 MG tablet Take 10 mg by mouth daily.    . chlorpheniramine-HYDROcodone (TUSSIONEX) 10-8 MG/5ML SUER hydrocodone 10 mg-chlorpheniramine 8 mg/5 mL oral susp extend.rel 12hr    . ciprofloxacin (CIPRO) 500 MG tablet ciprofloxacin 500 mg tablet    . clarithromycin (BIAXIN) 500 MG tablet clarithromycin 500 mg tablet    . Cyanocobalamin (VITAMIN B 12) 500 MCG TABS Take by mouth.    . dexlansoprazole (DEXILANT) 60 MG capsule Dexilant  60 mg capsule, delayed release    . fluconazole (DIFLUCAN) 100 MG tablet fluconazole 100 mg tablet    . fluticasone (FLONASE) 50 MCG/ACT nasal spray fluticasone propionate 50 mcg/actuation nasal spray,suspension    . fluticasone (FLOVENT HFA) 220 MCG/ACT inhaler     . gabapentin (NEURONTIN) 300 MG capsule TAKE (1) CAPSULE BY MOUTH AT BEDTIME. 90 capsule 0  . goserelin (ZOLADEX) 3.6 MG injection     . HYDROcodone-acetaminophen (NORCO/VICODIN) 5-325 MG tablet hydrocodone 5 mg-acetaminophen 325 mg tablet    . hydrocortisone (PROCTOSOL HC) 2.5 % rectal cream Proctosol HC 2.5 % topical cream perineal applicator    . hydrOXYzine (VISTARIL) 25 MG capsule hydroxyzine pamoate 25 mg capsule    . levofloxacin (LEVAQUIN) 500 MG tablet levofloxacin 500 mg tablet    . meloxicam (MOBIC) 15 MG tablet Take 1 tablet (15 mg total) by mouth daily. 30 tablet 3  . methocarbamol (ROBAXIN) 500 MG tablet methocarbamol 500 mg tablet    . methocarbamol (ROBAXIN) 750 MG tablet methocarbamol 750 mg tablet    . methylPREDNISolone (MEDROL DOSEPAK) 4 MG TBPK tablet Take by mouth as directed.    Marland Kitchen omeprazole (PRILOSEC) 40 MG capsule omeprazole 40 mg capsule,delayed release    . pantoprazole (PROTONIX) 40 MG tablet Take 40 mg by mouth daily.    . phenazopyridine (PYRIDIUM) 200 MG tablet phenazopyridine 200 mg tablet    . phentermine (ADIPEX-P) 37.5 MG tablet Take 37.5 mg by mouth every morning.    Marland Kitchen Phentermine-Topiramate (QSYMIA) 3.75-23 MG  CP24 Qsymia 3.75 mg-23 mg capsule, extended release  TK 1 C PO Q DAY    . Sod Picosulfate-Mag Ox-Cit Acd ([place]) 10-3.5-12 MG-GM-GM PACK Prepopik 10 mg-3.5 gram-12 gram oral powder packet    . tiZANidine (ZANAFLEX) 4 MG tablet tizanidine 4 mg tablet    . traMADol (ULTRAM) 50 MG tablet tramadol 50 mg tablet    . Vitamin D, Ergocalciferol, 50 MCG (2000 UT) CAPS Take by mouth. 30 capsule   . zonisamide (ZONEGRAN) 25 MG capsule Take 50 mg by mouth daily.      No current facility-administered medications for this visit.    OBJECTIVE: white woman who looks younger than stated age  83:   11/29/20 1506  BP: 122/84  Pulse: 71  Resp: 18  Temp: (!) 97.5 F (36.4 C)  SpO2: 100%     Body mass index is 44.46 kg/m.   Wt Readings from Last 3 Encounters:  11/29/20 267 lb 3.2 oz (121.2 kg)  12/18/19 265 lb 9.6 oz (120.5 kg)  07/03/19 262 lb 12.8 oz (119.2 kg)      ECOG FS:1 - Symptomatic but completely ambulatory  Sclerae unicteric, EOMs intact Wearing a mask No cervical or supraclavicular adenopathy Lungs no rales or rhonchi Heart regular rate and rhythm Abd soft, obese, nontender, positive bowel sounds MSK no focal spinal tenderness, no upper extremity lymphedema Neuro: nonfocal, well oriented, appropriate affect Breasts: The right breast is unremarkable.  The left breast has undergone lumpectomy followed by radiation.  There is no evidence of local recurrence.  Both axillae are benign.   LAB RESULTS:  CMP     Component Value Date/Time   NA 139 09/03/2020 1412   K 4.0 09/03/2020 1412   CL 108 09/03/2020 1412   CO2 23 09/03/2020 1412   GLUCOSE 102 (H) 09/03/2020 1412   BUN 13 09/03/2020 1412   CREATININE 1.05 (H) 09/03/2020 1412   CREATININE 1.05 (H) 05/22/2018 1158   CREATININE 1.08 08/22/2013 1039  CALCIUM 9.0 09/03/2020 1412   PROT 6.9 09/03/2020 1412   ALBUMIN 3.7 09/03/2020 1412   AST 39 09/03/2020 1412   AST 19 05/22/2018 1158   ALT 53 (H) 09/03/2020 1412    ALT 25 05/22/2018 1158   ALKPHOS 61 09/03/2020 1412   BILITOT 0.8 09/03/2020 1412   BILITOT 0.6 05/22/2018 1158   GFRNONAA >60 09/03/2020 1412   GFRNONAA >60 05/22/2018 1158   GFRAA 57 (L) 12/18/2019 1410   GFRAA >60 05/22/2018 1158    No results found for: TOTALPROTELP, ALBUMINELP, A1GS, A2GS, BETS, BETA2SER, GAMS, MSPIKE, SPEI  No results found for: KPAFRELGTCHN, LAMBDASER, KAPLAMBRATIO  Lab Results  Component Value Date   WBC 8.8 11/29/2020   NEUTROABS 5.9 11/29/2020   HGB 14.3 11/29/2020   HCT 40.8 11/29/2020   MCV 91.9 11/29/2020   PLT 203 11/29/2020   No results found for: LABCA2  No components found for: DJTTSV779  No results for input(s): INR in the last 168 hours.  No results found for: LABCA2  No results found for: TJQ300  No results found for: PQZ300  No results found for: TMA263  No results found for: CA2729  No components found for: HGQUANT  No results found for: CEA1 / No results found for: CEA1   No results found for: AFPTUMOR  No results found for: CHROMOGRNA  No results found for: HGBA, HGBA2QUANT, HGBFQUANT, HGBSQUAN (Hemoglobinopathy evaluation)   No results found for: LDH  No results found for: IRON, TIBC, IRONPCTSAT (Iron and TIBC)  No results found for: FERRITIN  Urinalysis No results found for: COLORURINE, APPEARANCEUR, LABSPEC, PHURINE, GLUCOSEU, HGBUR, BILIRUBINUR, KETONESUR, PROTEINUR, UROBILINOGEN, NITRITE, LEUKOCYTESUR   STUDIES:  DG Foot Complete Right  Result Date: 11/22/2020 Please see detailed radiograph report in office note.    ELIGIBLE FOR AVAILABLE RESEARCH PROTOCOL: no   ASSESSMENT: 40 y.o. [facility] woman, status post left breast biopsy x2 on 05/07/2018, showing (a) left breast upper inner quadrant 2 cm from the nipple: a clinical T1b N0, stage IA invasive ductal carcinoma, grade 1, estrogen and progesterone receptor strongly positive, HER-2 not amplified, with an MIB-1 of 10%; strongly  E-cadherin positive (b) left breast upper inner quadrant 3 cm from the nipple, a clinical T1c N0, stage IA invasive ductal carcinoma, grade 2, estrogen receptor positive with moderate staining intensity, progesterone receptor positive with strong staining intensity, with an MIB-1 of 10%, and no HER-2 amplification; faint but positive E-cadherin stain  (1) genetics testing 05/28/2018 through the Multi-Cancer Panel offered by [facility] showed no deleterious mutations in AIP, ALK, APC, ATM, AXIN2, BAP1, BARD1, BLM, BMPR1A, BRCA1, BRCA2, BRIP1, BUB1B, CASR, CDC73, CDH1, CDK4, CDKN1B, CDKN1C, CDKN2A, CEBPA, CEP57, CHEK2, CTNNA1, DICER1, DIS3L2, EGFR, ENG, EPCAM, FH, FLCN, GALNT12, GATA2, GPC3, GREM1, HOXB13, HRAS, KIT, MAX, MEN1, MET, MITF, MLH1, MLH3, MSH2, MSH3, MSH6, MUTYH, NBN, NF1, NF2, NTHL1, PALB2, PDGFRA, PHOX2B, PMS2, POLD1, POLE, POT1, PRKAR1A, PTCH1, PTEN, RAD50, RAD51C, RAD51D, RB1, RECQL4, RET, RNF43, RPS20, RUNX1, SDHA, SDHAF2, SDHB, SDHC, SDHD, SMAD4, SMARCA4, SMARCB1, SMARCE1, STK11, SUFU, TERC, TERT, TMEM127, TP53, TSC1, TSC2, VHL, WRN, WT1  (2) status post left lumpectomy and sentinel lymph node sampling 06/24/2018 for a pT2 pN1, stage IIA invasive ductal carcinoma, grade 1, with negative margins  (a) a total of 2 sentinel lymph nodes were removed  (3) MammaPrint read as low risk, with an average 10-year risk of recurrence with no treatment of 10%, the 5-year disease-free survival being 97.8% with hormone treatment alone  (4) adjuvant radiation 07/31/2018 -  09/17/2018  (a) Left Breast and IM nodes/ 50.4 Gy in 28 fractions of 1.8 Gy  (b) Left post axilla/ supraclavicular / 50.4 Gy in 28 fractions of 1.8 Gy  (c) Boost / 10 Gy in 5 fractions of 2 Gy  (5) anastrozole started 09/19/2018  (a) goserelin started 09/25/2018, initially monthly  (b) goserelin changed to every 3 months beginning 12/18/2019   PLAN: Maida is now 2-1/2 years out from definitive surgery for breast cancer with no  evidence of disease recurrence.  This is very favorable.  She is tolerating the anastrozole/goserelin combination well.  However she has significant vaginal dryness and this is impeding intimacy.  I am referring her to our physical therapy for the pelvis and I hope that will be helpful.  I also suggested she and her husband although they do communicate fairly well could undergo counseling on this issue.  We talked about switching to tamoxifen but she really does not want to do that.  She is very pleased with the treatment she is receiving and very confident in it.  Given her history of pseudotumor cerebri and her unusual current headaches I am obtaining a repeat brain MRI on her  She will return in 3 months for her goserelin and in 6 months for the same plus a visit.  She will be due for repeat mammography in November  She knows to call for any other issue that may develop before then.  Total encounter time 25 minutes.*   Richy Spradley, Virgie Dad, MD  11/29/20 3:22 PM Medical Oncology and Hematology [facility] [place], [place] [address] Tel. [phone]    Fax. [phone]    I, Wilburn Mylar, am acting as scribe for Dr. Virgie Dad. Melessa Cowell.  I, Lurline Del MD, have reviewed the above documentation for accuracy and completeness, and I agree with the above.   *Total Encounter Time as defined by the Centers for Medicare and Medicaid Services includes, in addition to the face-to-face time of a patient visit (documented in the note above) non-face-to-face time: obtaining and reviewing outside history, ordering and reviewing medications, tests or procedures, care coordination (communications with other health care professionals or caregivers) and documentation in the medical record.

## 2020-11-29 ENCOUNTER — Other Ambulatory Visit: Payer: Self-pay

## 2020-11-29 ENCOUNTER — Inpatient Hospital Stay: Payer: 59 | Attending: Oncology

## 2020-11-29 ENCOUNTER — Inpatient Hospital Stay (HOSPITAL_BASED_OUTPATIENT_CLINIC_OR_DEPARTMENT_OTHER): Payer: 59 | Admitting: Oncology

## 2020-11-29 ENCOUNTER — Inpatient Hospital Stay: Payer: 59

## 2020-11-29 ENCOUNTER — Ambulatory Visit: Payer: 59 | Admitting: Adult Health

## 2020-11-29 VITALS — BP 122/84 | HR 71 | Temp 97.5°F | Resp 18 | Ht 65.0 in | Wt 267.2 lb

## 2020-11-29 DIAGNOSIS — Z79811 Long term (current) use of aromatase inhibitors: Secondary | ICD-10-CM | POA: Diagnosis not present

## 2020-11-29 DIAGNOSIS — Z8052 Family history of malignant neoplasm of bladder: Secondary | ICD-10-CM | POA: Insufficient documentation

## 2020-11-29 DIAGNOSIS — Z923 Personal history of irradiation: Secondary | ICD-10-CM | POA: Insufficient documentation

## 2020-11-29 DIAGNOSIS — Z801 Family history of malignant neoplasm of trachea, bronchus and lung: Secondary | ICD-10-CM | POA: Insufficient documentation

## 2020-11-29 DIAGNOSIS — C50812 Malignant neoplasm of overlapping sites of left female breast: Secondary | ICD-10-CM

## 2020-11-29 DIAGNOSIS — R232 Flushing: Secondary | ICD-10-CM | POA: Diagnosis not present

## 2020-11-29 DIAGNOSIS — K219 Gastro-esophageal reflux disease without esophagitis: Secondary | ICD-10-CM | POA: Insufficient documentation

## 2020-11-29 DIAGNOSIS — Z800 Family history of malignant neoplasm of digestive organs: Secondary | ICD-10-CM | POA: Diagnosis not present

## 2020-11-29 DIAGNOSIS — Z5111 Encounter for antineoplastic chemotherapy: Secondary | ICD-10-CM | POA: Insufficient documentation

## 2020-11-29 DIAGNOSIS — C50212 Malignant neoplasm of upper-inner quadrant of left female breast: Secondary | ICD-10-CM | POA: Insufficient documentation

## 2020-11-29 DIAGNOSIS — Z8051 Family history of malignant neoplasm of kidney: Secondary | ICD-10-CM | POA: Diagnosis not present

## 2020-11-29 DIAGNOSIS — Z170 Estrogen receptor positive status [ER+]: Secondary | ICD-10-CM

## 2020-11-29 DIAGNOSIS — Z79899 Other long term (current) drug therapy: Secondary | ICD-10-CM | POA: Insufficient documentation

## 2020-11-29 DIAGNOSIS — G932 Benign intracranial hypertension: Secondary | ICD-10-CM | POA: Diagnosis not present

## 2020-11-29 LAB — COMPREHENSIVE METABOLIC PANEL
ALT: 45 U/L — ABNORMAL HIGH (ref 0–44)
AST: 30 U/L (ref 15–41)
Albumin: 3.5 g/dL (ref 3.5–5.0)
Alkaline Phosphatase: 66 U/L (ref 38–126)
Anion gap: 5 (ref 5–15)
BUN: 16 mg/dL (ref 6–20)
CO2: 22 mmol/L (ref 22–32)
Calcium: 8.9 mg/dL (ref 8.9–10.3)
Chloride: 114 mmol/L — ABNORMAL HIGH (ref 98–111)
Creatinine, Ser: 1.15 mg/dL — ABNORMAL HIGH (ref 0.44–1.00)
GFR, Estimated: >60 mL/min (ref >60)
Glucose, Bld: 118 mg/dL — ABNORMAL HIGH (ref 70–99)
Potassium: 3.9 mmol/L (ref 3.5–5.1)
Sodium: 141 mmol/L (ref 135–145)
Total Bilirubin: 0.5 mg/dL (ref 0.3–1.2)
Total Protein: 6.5 g/dL (ref 6.5–8.1)

## 2020-11-29 LAB — CBC WITH DIFFERENTIAL/PLATELET
Abs Immature Granulocytes: 0.03 10*3/uL (ref 0.00–0.07)
Basophils Absolute: 0 10*3/uL (ref 0.0–0.1)
Basophils Relative: 1 %
Eosinophils Absolute: 0.4 10*3/uL (ref 0.0–0.5)
Eosinophils Relative: 4 %
HCT: 40.8 % (ref 36.0–46.0)
Hemoglobin: 14.3 g/dL (ref 12.0–15.0)
Immature Granulocytes: 0 %
Lymphocytes Relative: 19 %
Lymphs Abs: 1.7 10*3/uL (ref 0.7–4.0)
MCH: 32.2 pg (ref 26.0–34.0)
MCHC: 35 g/dL (ref 30.0–36.0)
MCV: 91.9 fL (ref 80.0–100.0)
Monocytes Absolute: 0.7 10*3/uL (ref 0.1–1.0)
Monocytes Relative: 8 %
Neutro Abs: 5.9 10*3/uL (ref 1.7–7.7)
Neutrophils Relative %: 68 %
Platelets: 203 10*3/uL (ref 150–400)
RBC: 4.44 MIL/uL (ref 3.87–5.11)
RDW: 12.8 % (ref 11.5–15.5)
WBC: 8.8 10*3/uL (ref 4.0–10.5)
nRBC: 0 % (ref 0.0–0.2)

## 2020-11-29 MED ORDER — GOSERELIN ACETATE 10.8 MG ~~LOC~~ IMPL
10.8000 mg | DRUG_IMPLANT | Freq: Once | SUBCUTANEOUS | Status: AC
  Administered 2020-11-29: 10.8 mg via SUBCUTANEOUS
  Filled 2020-11-29: qty 10.8

## 2020-11-29 MED ORDER — ANASTROZOLE 1 MG PO TABS: 1.0000 mg | ORAL_TABLET | Freq: Every day | ORAL | 4 refills | Status: DC

## 2020-12-01 ENCOUNTER — Ambulatory Visit: Payer: 59

## 2020-12-01 ENCOUNTER — Ambulatory Visit: Payer: 59 | Admitting: Adult Health

## 2020-12-01 ENCOUNTER — Other Ambulatory Visit: Payer: 59

## 2021-01-03 ENCOUNTER — Ambulatory Visit: Payer: 59 | Admitting: Podiatry

## 2021-01-03 ENCOUNTER — Encounter: Payer: Self-pay | Admitting: Podiatry

## 2021-01-03 ENCOUNTER — Other Ambulatory Visit: Payer: Self-pay

## 2021-01-03 DIAGNOSIS — M722 Plantar fascial fibromatosis: Secondary | ICD-10-CM | POA: Diagnosis not present

## 2021-01-03 DIAGNOSIS — B351 Tinea unguium: Secondary | ICD-10-CM

## 2021-01-03 DIAGNOSIS — M216X1 Other acquired deformities of right foot: Secondary | ICD-10-CM

## 2021-01-03 DIAGNOSIS — M21861 Other specified acquired deformities of right lower leg: Secondary | ICD-10-CM

## 2021-01-03 NOTE — Progress Notes (Signed)
  Subjective:  Patient ID: Breanna [name], female    DOB: 01-13-1981,  MRN: [id number]  Chief Complaint  Patient presents with   Plantar Fasciitis    6 week follow up right foot patient states pain has improved but not to where she would like it to be.     40 y.o. female returns for follow-up with the above complaint. History confirmed with patient.  Says it is about 60% better  Objective:  Physical Exam: warm, good capillary refill, no trophic changes or ulcerative lesions, normal DP and PT pulses and normal sensory exam.   Right Foot: Less pain in the plantar and medial heel today, more in the mid arch and plantar fascia  Radiographs: X-ray of the left foot: no fracture, dislocation, swelling or degenerative changes noted and plantar calcaneal spur Assessment:   1. Nail fungus   2. Plantar fasciitis of right foot   3. Gastrocnemius equinus of right lower extremity      Plan:  Patient was evaluated and treated and all questions answered.   Discussed the etiology and treatment options for plantar fasciitis including stretching, formal physical therapy, supportive shoegears such as a running shoe or sneaker, pre fabricated orthoses, injection therapy, and oral medications. We also discussed the role of surgical treatment of this for patients who do not improve after exhausting non-surgical treatment options.  -Overall she is had quite a bit of improvement -Continue home therapy and exercise plan -I do think physical therapy would be beneficial, referral sent to [facility] at [facility] [place] -The night splint has not been comfortable for her, I recommended a [place] sock, she can get this on [address] -Continue meloxicam -If not improved by next visit repeat injection with CAM boot and rest  Return in about 6 weeks (around 02/14/2021) for recheck plantar fasciitis.

## 2021-01-03 NOTE — Patient Instructions (Signed)
[place] at [facility]. [address],  [place]  [address]  Main: [phone]     Look for a [place] sock on [employer]

## 2021-01-19 ENCOUNTER — Other Ambulatory Visit: Payer: Self-pay | Admitting: Oncology

## 2021-01-25 ENCOUNTER — Encounter (HOSPITAL_COMMUNITY): Payer: Self-pay | Admitting: Physical Therapy

## 2021-01-25 ENCOUNTER — Other Ambulatory Visit: Payer: Self-pay

## 2021-01-25 ENCOUNTER — Ambulatory Visit (HOSPITAL_COMMUNITY): Payer: 59 | Attending: Podiatry | Admitting: Physical Therapy

## 2021-01-25 DIAGNOSIS — M79671 Pain in right foot: Secondary | ICD-10-CM | POA: Diagnosis not present

## 2021-01-25 NOTE — Therapy (Signed)
[place], [place], [address] Phone: [phone]   Fax:  [phone]  Physical Therapy Evaluation  Patient Details  Name: Breanna [name] MRN: [id number] Date of Birth: 1981/04/16 Referring Provider (PT): Lanae Crumbly DPM   Encounter Date: 01/25/2021   PT End of Session - 01/25/21 1215     Visit Number 1    Number of Visits 4    Date for PT Re-Evaluation 02/22/21    Authorization Type [employer]    PT Start Time 1130    PT Stop Time 1210    PT Time Calculation (min) 40 min    Activity Tolerance Patient tolerated treatment well    Behavior During Therapy [facility] for tasks assessed/performed             Past Medical History:  Diagnosis Date   Anxiety    Arthritis    knees   Asthma    Breast cancer ([place]) 05/2018   left    Cluster headaches    Cough 06/11/2018   Dental crown present    Family history of bladder cancer    Family history of kidney cancer    Family history of lung cancer    Family history of stomach cancer    GERD (gastroesophageal reflux disease)    Heart palpitations    occasional - no cardiologist   History of asthma    as a child - prn inhaler   History of radiation therapy 07/31/18- 09/17/18   Left Breast / 50.4 Gy in 28 fractions of 1.8 Gy, Left Supraclavicular / 50.4 Gy in 28 fractions of 1.8 Gy, and boost of 10 Gy in 5 fractions of 2 Gy each.    Obesity    Personal history of radiation therapy 07/2018   Pseudotumor cerebri syndrome    Seasonal allergies    Stuffy nose 06/11/2018    Past Surgical History:  Procedure Laterality Date   BIOPSY  11/08/2015   Procedure: BIOPSY;  Surgeon: Danie Binder, MD;  [facility];  Service: Endoscopy;;  Gastric bx and Gastric polyp bx   BREAST BIOPSY Left 05/07/2018   x2   BREAST LUMPECTOMY Left 06/24/2018   BREAST LUMPECTOMY WITH RADIOACTIVE SEED AND SENTINEL LYMPH NODE BIOPSY Left 06/24/2018   Procedure: LEFT  BREAST LUMPECTOMY WITH  BRACKETED RADIOACTIVE SEED AND LEFT AXILLARY DEEP SENTINEL LYMPH NODE BIOPSY AND BLUE DYE INJECTION;  Surgeon: Fanny Skates, MD;  Location: [place];  Service: General;  Laterality: Left;   CESAREAN SECTION  11/03/2003   CESAREAN SECTION  05/15/2006   CHOLECYSTECTOMY     COLONOSCOPY N/A 11/08/2015   Procedure: COLONOSCOPY;  Surgeon: Danie Binder, MD;  [facility];  Service: Endoscopy;  Laterality: N/A;  1000   ESOPHAGOGASTRODUODENOSCOPY N/A 11/08/2015   Procedure: ESOPHAGOGASTRODUODENOSCOPY (EGD);  Surgeon: Danie Binder, MD;  [facility];  Service: Endoscopy;  Laterality: N/A;   HEMORRHOID BANDING N/A 11/08/2015   Procedure: HEMORRHOID BANDING;  Surgeon: Danie Binder, MD;  [facility];  Service: Endoscopy;  Laterality: N/A;   SAVORY DILATION N/A 11/08/2015   Procedure: SAVORY DILATION;  Surgeon: Danie Binder, MD;  [facility];  Service: Endoscopy;  Laterality: N/A;   WISDOM TOOTH EXTRACTION      There were no vitals filed for this visit.    Subjective Assessment - 01/25/21 1137     Subjective Patient presents to therapy with complaint  of RT foot pain. Was diagnosed with plantar fasciitis. Had injection 2 weeks ago. Recommended for therapy. Says injection was somewhat helpful and recently began Meloxicam.    Limitations Standing;Walking;House hold activities    Patient Stated Goals Want it to not hurt so bad    Currently in Pain? Yes    Pain Score 2     Pain Location Foot    Pain Orientation Right    Pain Descriptors / Indicators Burning;Throbbing;Sharp;Stabbing    Pain Type Chronic pain    Pain Onset More than a month ago    Pain Frequency Intermittent    Aggravating Factors  standing, walking, first things in morning    Pain Relieving Factors non WB, injection, meds    Effect of Pain on Daily Activities Limits                [facility] PT Assessment - 01/25/21 0001       Assessment    Medical Diagnosis --    Referring Provider (PT) Lanae Crumbly DPM    Prior Therapy No      Prior Function   Level of Independence Independent      Cognition   Overall Cognitive Status Within Functional Limits for tasks assessed      ROM / Strength   AROM / PROM / Strength AROM;Strength      AROM   Overall AROM Comments Bilat ankle mobility WFL except DF    AROM Assessment Site Ankle    Right/Left Ankle Right;Left    Right Ankle Dorsiflexion 4    Left Ankle Dorsiflexion 6      Strength   Overall Strength Comments RT ankle MMT WNL, except 4/5 PF      Flexibility   Soft Tissue Assessment /Muscle Length --   Mod restriciton in bilateral calf flexibility     Palpation   Palpation comment Mod TTP about RT plantar heel and medial arch      Ambulation/Gait   Gait Comments RLE in external rotation, likley due to calf tightness                        Objective measurements completed on examination: See above findings.       [facility] [place] Adult PT Treatment/Exercise - 01/25/21 0001       Exercises   Exercises Ankle      Ankle Exercises: Stretches   Soleus Stretch 1 rep;30 seconds    Gastroc Stretch 1 rep;30 seconds                    PT Education - 01/25/21 1138     Education Details on evaluation findings, POC and HEP    Person(s) Educated Patient    Methods Explanation;Handout    Comprehension Verbalized understanding              PT Short Term Goals - 01/25/21 1219       PT SHORT TERM GOAL #1   Title Patient will be independent with initial HEP and self-management strategies to improve functional outcomes    Time 3    Period Weeks    Status New    Target Date 02/15/21      PT SHORT TERM GOAL #2   Title Patient will report at least 75% overall improvement in subjective complaint to indicate improvement in ability to perform ADLs.    Time 3    Period Weeks    Status New  Target Date 02/15/21      PT SHORT TERM GOAL #3    Title Patient will improve bilat ankle DF to at least 10 degrees for decreased pain and improved ambulation and gait mechanics    Time 3    Period Weeks    Status New    Target Date 02/15/21                       Plan - 01/25/21 1216     Clinical Impression Statement Patient is a 40 y.o. female who presents to physical therapy with complaint of Rt foot pain. Patient demonstrates decreased strength, ROM restriction, and gait abnormalities which are likely contributing to symptoms of pain and are negatively impacting patient ability to perform ADLs and functional mobility tasks. Patient will benefit from skilled physical therapy services to address these deficits to reduce pain, improve level of function with ADLs and functional mobility tasks.    Examination-Activity Limitations Locomotion Level;Stand    Examination-Participation Restrictions Occupation;Community Activity;Cleaning    Stability/Clinical Decision Making Stable/Uncomplicated    Clinical Decision Making Low    Rehab Potential Good    PT Frequency 1x / week    PT Duration 3 weeks    PT Treatment/Interventions ADLs/Self Care Home Management;Aquatic Therapy;Biofeedback;Moist Heat;Iontophoresis 4mg /ml Dexamethasone;DME Instruction;Gait training;Balance training;Scar mobilization;Passive range of motion;Dry needling;Energy conservation;Spinal Manipulations;Manual techniques;Stair training;Traction;Functional mobility training;Ultrasound;Cryotherapy;Electrical Stimulation;Contrast Bath;Therapeutic exercise;Therapeutic activities;Fluidtherapy;Parrafin;Patient/family education;Compression bandaging;Orthotic Fit/Training;Manual lymph drainage;Splinting;Taping;Vasopneumatic Device;Joint Manipulations;Neuromuscular re-education    PT Home Exercise Plan Eval: gastroc stretch, soleus stretch, golf ball STM    Consulted and Agree with Plan of Care Patient             Patient will benefit from skilled therapeutic intervention in  order to improve the following deficits and impairments:  Pain, Improper body mechanics, Increased fascial restricitons, Abnormal gait, Difficulty walking, Impaired flexibility, Decreased activity tolerance, Decreased range of motion  Visit Diagnosis: Pain in right foot     Problem List Patient Active Problem List   Diagnosis Date Noted   Morbid obesity with body mass index (BMI) of 40.0 to 44.9 in adult [facility]) 12/18/2019   Genetic testing 05/28/2018   Malignant neoplasm of overlapping sites of left breast in female, estrogen receptor positive ([place]) 05/22/2018   Family history of kidney cancer    Family history of bladder cancer    Family history of stomach cancer    Family history of lung cancer    Esophageal reflux    GERD (gastroesophageal reflux disease) 09/29/2015   Rectal bleeding 09/29/2015   Dysphagia 09/29/2015   Insertion of Nexplanon 06/16/2015   Pain of both breasts 05/21/2014   Breast nodule 05/21/2014   Anxiety 05/21/2014   Pseudotumor cerebri syndrome 08/22/2013   12:22 PM, 01/25/21 Josue Hector PT DPT  Physical Therapist with [facility]  [phone]) [phone] [place] [address] [place], [place], [address] Phone: [phone]   Fax:  [phone]  Name: Breanna [name] MRN: [id number] Date of Birth: 09/02/1980

## 2021-01-25 NOTE — Patient Instructions (Signed)
Access Code: [email or web address] URL: [email or web address] Date: 01/25/2021 Prepared by: Josue Hector  Exercises Gastroc Stretch on Wall - 3 x daily - 7 x weekly - 1 sets - 3 reps - 30 seconds hold Soleus Stretch on Wall - 3 x daily - 7 x weekly - 1 sets - 3 reps - 30 seconds hold Seated Plantar Fascia Mobilization with Small Ball - 3 x daily - 7 x weekly - 1 sets - 1 reps - 3-5 minutes hold

## 2021-01-31 ENCOUNTER — Encounter (HOSPITAL_COMMUNITY): Payer: Self-pay | Admitting: Physical Therapy

## 2021-01-31 ENCOUNTER — Ambulatory Visit (HOSPITAL_COMMUNITY): Payer: 59 | Admitting: Physical Therapy

## 2021-01-31 ENCOUNTER — Encounter: Payer: Self-pay | Admitting: Oncology

## 2021-01-31 ENCOUNTER — Other Ambulatory Visit: Payer: Self-pay

## 2021-01-31 DIAGNOSIS — M79671 Pain in right foot: Secondary | ICD-10-CM

## 2021-01-31 NOTE — Patient Instructions (Signed)
Access Code: [email or web address] URL: [email or web address] Date: 01/31/2021 Prepared by: Mitzi Hansen Latavion Halls  Exercises Heel Raises with Counter Support - 1 x daily - 7 x weekly - 2 sets - 15 reps Standing Toe Raises at Chair - 1 x daily - 7 x weekly - 2 sets - 15 reps

## 2021-01-31 NOTE — Therapy (Signed)
[place], [place], [address] Phone: [phone]   Fax:  [phone]  Physical Therapy Treatment  Patient Details  Name: Breanna [name] MRN: [id number] Date of Birth: 11-Feb-1981 Referring Provider (PT): Lanae Crumbly DPM   Encounter Date: 01/31/2021   PT End of Session - 01/31/21 1055     Visit Number 2    Number of Visits 4    Date for PT Re-Evaluation 02/22/21    Authorization Type [employer]    PT Start Time 1055   arrives late/delayed check in   PT Stop Time 1127    PT Time Calculation (min) 32 min    Activity Tolerance Patient tolerated treatment well    Behavior During Therapy [facility] for tasks assessed/performed             Past Medical History:  Diagnosis Date   Anxiety    Arthritis    knees   Asthma    Breast cancer ([place]) 05/2018   left    Cluster headaches    Cough 06/11/2018   Dental crown present    Family history of bladder cancer    Family history of kidney cancer    Family history of lung cancer    Family history of stomach cancer    GERD (gastroesophageal reflux disease)    Heart palpitations    occasional - no cardiologist   History of asthma    as a child - prn inhaler   History of radiation therapy 07/31/18- 09/17/18   Left Breast / 50.4 Gy in 28 fractions of 1.8 Gy, Left Supraclavicular / 50.4 Gy in 28 fractions of 1.8 Gy, and boost of 10 Gy in 5 fractions of 2 Gy each.    Obesity    Personal history of radiation therapy 07/2018   Pseudotumor cerebri syndrome    Seasonal allergies    Stuffy nose 06/11/2018    Past Surgical History:  Procedure Laterality Date   BIOPSY  11/08/2015   Procedure: BIOPSY;  Surgeon: Danie Binder, MD;  [facility];  Service: Endoscopy;;  Gastric bx and Gastric polyp bx   BREAST BIOPSY Left 05/07/2018   x2   BREAST LUMPECTOMY Left 06/24/2018   BREAST LUMPECTOMY WITH RADIOACTIVE SEED AND SENTINEL LYMPH NODE BIOPSY Left  06/24/2018   Procedure: LEFT BREAST LUMPECTOMY WITH  BRACKETED RADIOACTIVE SEED AND LEFT AXILLARY DEEP SENTINEL LYMPH NODE BIOPSY AND BLUE DYE INJECTION;  Surgeon: Fanny Skates, MD;  Location: [place];  Service: General;  Laterality: Left;   CESAREAN SECTION  11/03/2003   CESAREAN SECTION  05/15/2006   CHOLECYSTECTOMY     COLONOSCOPY N/A 11/08/2015   Procedure: COLONOSCOPY;  Surgeon: Danie Binder, MD;  [facility];  Service: Endoscopy;  Laterality: N/A;  1000   ESOPHAGOGASTRODUODENOSCOPY N/A 11/08/2015   Procedure: ESOPHAGOGASTRODUODENOSCOPY (EGD);  Surgeon: Danie Binder, MD;  [facility];  Service: Endoscopy;  Laterality: N/A;   HEMORRHOID BANDING N/A 11/08/2015   Procedure: HEMORRHOID BANDING;  Surgeon: Danie Binder, MD;  [facility];  Service: Endoscopy;  Laterality: N/A;   SAVORY DILATION N/A 11/08/2015   Procedure: SAVORY DILATION;  Surgeon: Danie Binder, MD;  [facility];  Service: Endoscopy;  Laterality: N/A;   WISDOM TOOTH EXTRACTION      There were no vitals filed for this visit.   Subjective Assessment - 01/31/21 1056     Subjective Patient states  has some foot soreness. She has tried golf ball massage.    Limitations Standing;Walking;House hold activities    Patient Stated Goals Want it to not hurt so bad    Currently in Pain? Yes    Pain Score 5     Pain Location Foot    Pain Orientation Right    Pain Descriptors / Indicators Sharp    Pain Type Chronic pain    Pain Onset More than a month ago    Pain Frequency Intermittent                               [facility] Adult PT Treatment/Exercise - 01/31/21 0001       Ankle Exercises: Stretches   Slant Board Stretch 3 reps;30 seconds      Ankle Exercises: Standing   SLS 2x 30 seconds bilateral    Heel Raises Both;15 reps   2 sets   Toe Raise 15 reps   2 sets   Other Standing Ankle Exercises tandem stance 3x 30 seconds bilateral,  lateral stepping 4x 10 feet bilateral 2 sets      Ankle Exercises: Seated   Towel Crunch 1 rep   2 minutes   Towel Inversion/Eversion 1 rep   2 sets                   PT Education - 01/31/21 1056     Education Details HEP    Person(s) Educated Patient    Methods Explanation;Demonstration    Comprehension Verbalized understanding;Returned demonstration              PT Short Term Goals - 01/25/21 1219       PT SHORT TERM GOAL #1   Title Patient will be independent with initial HEP and self-management strategies to improve functional outcomes    Time 3    Period Weeks    Status New    Target Date 02/15/21      PT SHORT TERM GOAL #2   Title Patient will report at least 75% overall improvement in subjective complaint to indicate improvement in ability to perform ADLs.    Time 3    Period Weeks    Status New    Target Date 02/15/21      PT SHORT TERM GOAL #3   Title Patient will improve bilat ankle DF to at least 10 degrees for decreased pain and improved ambulation and gait mechanics    Time 3    Period Weeks    Status New    Target Date 02/15/21                      Plan - 01/31/21 1055     Clinical Impression Statement Began with slant board stretch for soft tissue length. Patient tolerates addition of ankle strengthening without increase in symptoms. Patient with minimal sway with balance exercises completed today. . Patient states decrease in symptom at end of session.  Educated on importance of compliance of HEP. Patient will continue to benefit from skilled physical therapy in order to reduce impairment and improve function.    Examination-Activity Limitations Locomotion Level;Stand    Examination-Participation Restrictions Occupation;Community Activity;Cleaning    Stability/Clinical Decision Making Stable/Uncomplicated    Rehab Potential Good    PT Frequency 1x / week    PT Duration 3 weeks    PT Treatment/Interventions ADLs/Self Care  Home Management;Aquatic Therapy;Biofeedback;Moist Heat;Iontophoresis 4mg /ml  Dexamethasone;DME Instruction;Gait training;Balance training;Scar mobilization;Passive range of motion;Dry needling;Energy conservation;Spinal Manipulations;Manual techniques;Stair training;Traction;Functional mobility training;Ultrasound;Cryotherapy;Electrical Stimulation;Contrast Bath;Therapeutic exercise;Therapeutic activities;Fluidtherapy;Parrafin;Patient/family education;Compression bandaging;Orthotic Fit/Training;Manual lymph drainage;Splinting;Taping;Vasopneumatic Device;Joint Manipulations;Neuromuscular re-education    PT Home Exercise Plan Eval: gastroc stretch, soleus stretch, golf ball STM 7/18 HR/TR    Consulted and Agree with Plan of Care Patient             Patient will benefit from skilled therapeutic intervention in order to improve the following deficits and impairments:  Pain, Improper body mechanics, Increased fascial restricitons, Abnormal gait, Difficulty walking, Impaired flexibility, Decreased activity tolerance, Decreased range of motion  Visit Diagnosis: Pain in right foot     Problem List Patient Active Problem List   Diagnosis Date Noted   Morbid obesity with body mass index (BMI) of 40.0 to 44.9 in adult [facility]) 12/18/2019   Genetic testing 05/28/2018   Malignant neoplasm of overlapping sites of left breast in female, estrogen receptor positive ([place]) 05/22/2018   Family history of kidney cancer    Family history of bladder cancer    Family history of stomach cancer    Family history of lung cancer    Esophageal reflux    GERD (gastroesophageal reflux disease) 09/29/2015   Rectal bleeding 09/29/2015   Dysphagia 09/29/2015   Insertion of Nexplanon 06/16/2015   Pain of both breasts 05/21/2014   Breast nodule 05/21/2014   Anxiety 05/21/2014   Pseudotumor cerebri syndrome 08/22/2013    11:28 AM, 01/31/21 Mearl Latin PT, DPT Physical Therapist at [facility] [address] [place], [place], [address] Phone: [phone]   Fax:  [phone]  Name: Breanna [name] MRN: [id number] Date of Birth: 01-24-1981

## 2021-02-07 ENCOUNTER — Encounter: Payer: Self-pay | Admitting: Oncology

## 2021-02-09 ENCOUNTER — Ambulatory Visit (HOSPITAL_COMMUNITY): Payer: 59 | Admitting: Physical Therapy

## 2021-02-14 ENCOUNTER — Ambulatory Visit: Payer: 59 | Admitting: Podiatry

## 2021-02-16 ENCOUNTER — Ambulatory Visit (HOSPITAL_COMMUNITY): Payer: 59 | Admitting: Physical Therapy

## 2021-02-24 ENCOUNTER — Inpatient Hospital Stay: Payer: 59

## 2021-02-24 ENCOUNTER — Other Ambulatory Visit: Payer: Self-pay

## 2021-02-24 ENCOUNTER — Inpatient Hospital Stay: Payer: 59 | Attending: Oncology

## 2021-02-24 VITALS — BP 128/80 | HR 79 | Temp 98.5°F | Resp 18

## 2021-02-24 DIAGNOSIS — Z5111 Encounter for antineoplastic chemotherapy: Secondary | ICD-10-CM | POA: Diagnosis not present

## 2021-02-24 DIAGNOSIS — C50812 Malignant neoplasm of overlapping sites of left female breast: Secondary | ICD-10-CM | POA: Diagnosis not present

## 2021-02-24 DIAGNOSIS — Z79899 Other long term (current) drug therapy: Secondary | ICD-10-CM | POA: Diagnosis not present

## 2021-02-24 DIAGNOSIS — Z170 Estrogen receptor positive status [ER+]: Secondary | ICD-10-CM

## 2021-02-24 LAB — CBC WITH DIFFERENTIAL/PLATELET
Abs Immature Granulocytes: 0.02 10*3/uL (ref 0.00–0.07)
Basophils Absolute: 0 10*3/uL (ref 0.0–0.1)
Basophils Relative: 1 %
Eosinophils Absolute: 0.4 10*3/uL (ref 0.0–0.5)
Eosinophils Relative: 5 %
HCT: 40.5 % (ref 36.0–46.0)
Hemoglobin: 14.3 g/dL (ref 12.0–15.0)
Immature Granulocytes: 0 %
Lymphocytes Relative: 27 %
Lymphs Abs: 1.8 10*3/uL (ref 0.7–4.0)
MCH: 32.5 pg (ref 26.0–34.0)
MCHC: 35.3 g/dL (ref 30.0–36.0)
MCV: 92 fL (ref 80.0–100.0)
Monocytes Absolute: 0.4 10*3/uL (ref 0.1–1.0)
Monocytes Relative: 6 %
Neutro Abs: 4.2 10*3/uL (ref 1.7–7.7)
Neutrophils Relative %: 61 %
Platelets: 201 10*3/uL (ref 150–400)
RBC: 4.4 MIL/uL (ref 3.87–5.11)
RDW: 12.6 % (ref 11.5–15.5)
WBC: 6.8 10*3/uL (ref 4.0–10.5)
nRBC: 0 % (ref 0.0–0.2)

## 2021-02-24 LAB — COMPREHENSIVE METABOLIC PANEL
ALT: 89 U/L — ABNORMAL HIGH (ref 0–44)
AST: 66 U/L — ABNORMAL HIGH (ref 15–41)
Albumin: 3.6 g/dL (ref 3.5–5.0)
Alkaline Phosphatase: 70 U/L (ref 38–126)
Anion gap: 8 (ref 5–15)
BUN: 13 mg/dL (ref 6–20)
CO2: 22 mmol/L (ref 22–32)
Calcium: 9 mg/dL (ref 8.9–10.3)
Chloride: 110 mmol/L (ref 98–111)
Creatinine, Ser: 1.1 mg/dL — ABNORMAL HIGH (ref 0.44–1.00)
GFR, Estimated: >60 mL/min (ref >60)
Glucose, Bld: 142 mg/dL — ABNORMAL HIGH (ref 70–99)
Potassium: 3.9 mmol/L (ref 3.5–5.1)
Sodium: 140 mmol/L (ref 135–145)
Total Bilirubin: 0.4 mg/dL (ref 0.3–1.2)
Total Protein: 6.6 g/dL (ref 6.5–8.1)

## 2021-02-24 MED ORDER — GOSERELIN ACETATE 10.8 MG ~~LOC~~ IMPL
10.8000 mg | DRUG_IMPLANT | Freq: Once | SUBCUTANEOUS | Status: AC
  Administered 2021-02-24: 10.8 mg via SUBCUTANEOUS
  Filled 2021-02-24: qty 10.8

## 2021-02-24 NOTE — Patient Instructions (Signed)
Goserelin injection What is this medication? GOSERELIN (GOE se rel in) is similar to a hormone found in the body. It lowers the amount of sex hormones that the body makes. Men will have lower testosterone levels and women will have lower estrogen levels while taking this medicine. In men, this medicine is used to treat prostate cancer; the injection is either given once per month or once every 12 weeks. A once per month injection (only) is used to treat women with endometriosis, dysfunctional uterine bleeding, or advanced breast cancer. This medicine may be used for other purposes; ask your health care provider or pharmacist if you have questions. COMMON BRAND NAME(S): Zoladex What should I tell my care team before I take this medication? They need to know if you have any of these conditions: bone problems diabetes heart disease history of irregular heartbeat an unusual or allergic reaction to goserelin, other medicines, foods, dyes, or preservatives pregnant or trying to get pregnant breast-feeding How should I use this medication? This medicine is for injection under the skin. It is given by a health care professional in a hospital or clinic setting. Talk to your pediatrician regarding the use of this medicine in children. Special care may be needed. Overdosage: If you think you have taken too much of this medicine contact a poison control center or emergency room at once. NOTE: This medicine is only for you. Do not share this medicine with others. What if I miss a dose? It is important not to miss your dose. Call your doctor or health care professional if you are unable to keep an appointment. What may interact with this medication? Do not take this medicine with any of the following medications: cisapride dronedarone pimozide thioridazine This medicine may also interact with the following medications: other medicines that prolong the QT interval (an abnormal heart rhythm) This list  may not describe all possible interactions. Give your health care provider a list of all the medicines, herbs, non-prescription drugs, or dietary supplements you use. Also tell them if you smoke, drink alcohol, or use illegal drugs. Some items may interact with your medicine. What should I watch for while using this medication? Visit your doctor or health care provider for regular checks on your progress. Your symptoms may appear to get worse during the first weeks of this therapy. Tell your doctor or healthcare provider if your symptoms do not start to get better or if they get worse after this time. Your bones may get weaker if you take this medicine for a long time. If you smoke or frequently drink alcohol you may increase your risk of bone loss. A family history of osteoporosis, chronic use of drugs for seizures (convulsions), or corticosteroids can also increase your risk of bone loss. Talk to your doctor about how to keep your bones strong. This medicine should stop regular monthly menstruation in women. Tell your doctor if you continue to menstruate. Women should not become pregnant while taking this medicine or for 12 weeks after stopping this medicine. Women should inform their doctor if they wish to become pregnant or think they might be pregnant. There is a potential for serious side effects to an unborn child. Talk to your health care professional or pharmacist for more information. Do not breast-feed an infant while taking this medicine. Men should inform their doctors if they wish to father a child. This medicine may lower sperm counts. Talk to your health care professional or pharmacist for more information. This medicine may   increase blood sugar. Ask your healthcare provider if changes in diet or medicines are needed if you have diabetes. What side effects may I notice from receiving this medication? Side effects that you should report to your doctor or health care professional as soon as  possible: allergic reactions like skin rash, itching or hives, swelling of the face, lips, or tongue bone pain breathing problems changes in vision chest pain feeling faint or lightheaded, falls fever, chills pain, swelling, warmth in the leg pain, tingling, numbness in the hands or feet signs and symptoms of high blood sugar such as being more thirsty or hungry or having to urinate more than normal. You may also feel very tired or have blurry vision signs and symptoms of low blood pressure like dizziness; feeling faint or lightheaded, falls; unusually weak or tired stomach pain swelling of the ankles, feet, hands trouble passing urine or change in the amount of urine unusually high or low blood pressure unusually weak or tired Side effects that usually do not require medical attention (report to your doctor or health care professional if they continue or are bothersome): change in sex drive or performance changes in breast size in both males and females changes in emotions or moods headache hot flashes irritation at site where injected loss of appetite skin problems like acne, dry skin vaginal dryness This list may not describe all possible side effects. Call your doctor for medical advice about side effects. You may report side effects to FDA at 1-800-FDA-1088. Where should I keep my medication? This drug is given in a hospital or clinic and will not be stored at home. NOTE: This sheet is a summary. It may not cover all possible information. If you have questions about this medicine, talk to your doctor, pharmacist, or health care provider.  2022 Elsevier/Gold Standard (2018-10-21 14:05:56)  

## 2021-03-02 ENCOUNTER — Other Ambulatory Visit: Payer: Self-pay | Admitting: Oncology

## 2021-03-03 ENCOUNTER — Other Ambulatory Visit: Payer: 59

## 2021-03-03 ENCOUNTER — Ambulatory Visit: Payer: 59

## 2021-05-04 ENCOUNTER — Other Ambulatory Visit: Payer: Self-pay | Admitting: Oncology

## 2021-05-04 DIAGNOSIS — Z853 Personal history of malignant neoplasm of breast: Secondary | ICD-10-CM

## 2021-06-05 NOTE — Progress Notes (Signed)
Breanna [name]  [phone] [phone]    ID: Breanna [name] DOB: May 07, 1981  MR#: [id number]  JYN#:[id number]  Patient Care Team: Ginger Organ as PCP - General (Physician Assistant) Danie Binder, MD (Inactive) as Consulting Physician (Gastroenterology) Quinnie Barcelo, Virgie Dad, MD as Consulting Physician (Oncology) Eppie Gibson, MD as Attending Physician (Radiation Oncology) Fanny Skates, MD as Consulting Physician (General Surgery) Estill Dooms, NP as Nurse Practitioner (Obstetrics and Gynecology) OTHER MD:   CHIEF COMPLAINT: Estrogen receptor positive multifocal breast cancer  CURRENT TREATMENT: goserelin/Zoladex; anastrozole   INTERVAL HISTORY: Breanna [name] returns today for follow-up of her estrogen receptor positive multifocal breast cancer.  She is accompanied by her husband Breanna [name].  She continues on anastrozole.  Hot flashes have improved considerably.  She still has significant vaginal dryness.  To use some K-Y jelly and some other over-the-counter agents.  She is aware of our pelvic rehab program but it is difficult for her to take time off for that at this point.  Her most recent bone density on 05/19/2019 showed a Z score of 1.4, which is expected for age.  She also receives goserelin every 12 weeks.  She has had no breakthrough bleeding and is tolerating this well.  She is scheduled for annual mammography on 06/17/2021.  She has density a breasts   REVIEW OF SYSTEMS: Contessa has developed some cherry hemangiomas, very minimal, on the anterior chest and she wanted me to look at those today.  They are not in the radiation field.  She is not exercising regularly as such but she does of course have a physical job and so she gets between 4 and 7000 steps most days.  A detailed review of systems today was otherwise stable.  COVID 19 VACCINATION STATUS: fully vaccinated Levan Hurst) with no booster as of November 2022; had COVID  November 2021  HISTORY OF CURRENT ILLNESS: From the original intake note:  Breanna [name] noted a palpable abnormality in the 4 o'clock location of the LEFT breast sometime in September 2019.  She brought it up to medical attention and underwent bilateral diagnostic mammography with tomography and left breast ultrasonography at [facility] on 04/23/2018 showing: a Suspicious mass in the 9:30 o'clock location of the LEFT breast 2 centimeters from the nipple; by ultrasound this was an irregular hypoechoic mass with spiculated margins and posterior acoustic shadowing which measures 0.5 x 0.7 x 1.1 centimeters. A second similar lesion is also suspected in the same portion of the breast but has not been completely evaluated. Recommend targeted ultrasound of the MEDIAL aspect of the LEFT breast at the time of patient's biopsy and possible second biopsy. LEFT axilla is negative for adenopathy. The areas of clinical concern in the LOWER OUTER QUADRANT of the LEFT breast in the [place] 10 centimeters from the nipple are negative by mammogram and ultrasound.  Accordingly on 05/07/2018 the patient proceeded to biopsy of the left breast areas in question. The pathology from this procedure showed ([id number]):  1. Breast, left, needle core biopsy, at 9:30 o'clock 3 cm from the nipple: invasive mammary carcinoma, grade II. Mammary carcinoma in situ.  2. Breast, left, needle core biopsy, 9:30 o'clock 2 cm from the nipple with invasive ductal carcinoma, grade I. Mammary carcinoma in situ.  E-cadherin stains are faint in the larger tumor but still positive, strong in the second tumor, and thus these are read as ductal.  Prognostic indicators significant for:  1. Estrogen receptor, 80%  positive, moderate staining intensity and progesterone receptor, 80% positive, strong staining intensity. Proliferation marker Ki67 at 10%. HER2 negative by immunohistochemistry, 1+.   2. Estrogen Receptor: 90%,  positive, strong staining intensity and progesterone Receptor: 90%, positive, strong staining intensity. Proliferation Marker Ki67: 10%. HER2 negative by immunohistochemistry, 1+.    The patient's subsequent history is as detailed below.   PAST MEDICAL HISTORY: Past Medical History:  Diagnosis Date   Anxiety    Arthritis    knees   Asthma    Breast cancer ([place]) 05/2018   left    Cluster headaches    Cough 06/11/2018   Dental crown present    Family history of bladder cancer    Family history of kidney cancer    Family history of lung cancer    Family history of stomach cancer    GERD (gastroesophageal reflux disease)    Heart palpitations    occasional - no cardiologist   History of asthma    as a child - prn inhaler   History of radiation therapy 07/31/18- 09/17/18   Left Breast / 50.4 Gy in 28 fractions of 1.8 Gy, Left Supraclavicular / 50.4 Gy in 28 fractions of 1.8 Gy, and boost of 10 Gy in 5 fractions of 2 Gy each.    Obesity    Personal history of radiation therapy 07/2018   Pseudotumor cerebri syndrome    Seasonal allergies    Stuffy nose 06/11/2018    PAST SURGICAL HISTORY: Past Surgical History:  Procedure Laterality Date   BIOPSY  11/08/2015   Procedure: BIOPSY;  Surgeon: Danie Binder, MD;  [facility];  Service: Endoscopy;;  Gastric bx and Gastric polyp bx   BREAST BIOPSY Left 05/07/2018   x2   BREAST LUMPECTOMY Left 06/24/2018   BREAST LUMPECTOMY WITH RADIOACTIVE SEED AND SENTINEL LYMPH NODE BIOPSY Left 06/24/2018   Procedure: LEFT BREAST LUMPECTOMY WITH  BRACKETED RADIOACTIVE SEED AND LEFT AXILLARY DEEP SENTINEL LYMPH NODE BIOPSY AND BLUE DYE INJECTION;  Surgeon: Fanny Skates, MD;  Location: [facility];  Service: General;  Laterality: Left;   CESAREAN SECTION  11/03/2003   CESAREAN SECTION  05/15/2006   CHOLECYSTECTOMY     COLONOSCOPY N/A 11/08/2015   Procedure: COLONOSCOPY;  Surgeon: Danie Binder, MD;  [facility];   Service: Endoscopy;  Laterality: N/A;  1000   ESOPHAGOGASTRODUODENOSCOPY N/A 11/08/2015   Procedure: ESOPHAGOGASTRODUODENOSCOPY (EGD);  Surgeon: Danie Binder, MD;  [facility];  Service: Endoscopy;  Laterality: N/A;   HEMORRHOID BANDING N/A 11/08/2015   Procedure: HEMORRHOID BANDING;  Surgeon: Danie Binder, MD;  [facility];  Service: Endoscopy;  Laterality: N/A;   SAVORY DILATION N/A 11/08/2015   Procedure: SAVORY DILATION;  Surgeon: Danie Binder, MD;  [facility];  Service: Endoscopy;  Laterality: N/A;   WISDOM TOOTH EXTRACTION      FAMILY HISTORY: Family History  Problem Relation Age of Onset   COPD Mother    Pneumonia Mother    Hyperlipidemia Father    Kidney cancer Father 15       "simple renal cell carcinoma"   Heart disease Maternal Aunt    Hypertension Maternal Grandmother    Diabetes Maternal Grandmother    Stroke Paternal Grandmother    Hypertension Paternal Grandmother    Diabetes Paternal Grandmother    Stomach cancer Paternal Grandmother 69       had a portion of stomah removed   Diabetes  Paternal Grandfather    Heart disease Paternal Grandfather    Hyperlipidemia Paternal Grandfather    Hypertension Paternal Grandfather    Bladder Cancer Maternal Uncle        hx smoking   Lung cancer Other   As of November 2019 her father is 50 years old. Patients' mother died from pneumonia at age 44. The patient has 0 brothers and 0 sisters. Patient denies any family story of ovarian or breast cancer. Her paternal grandmother had gastric cancer in 2000. Her paternal aunt had lung cancer without a hx of smoking. Her maternal uncle has had bladder cancer.    GYNECOLOGIC HISTORY:  No LMP recorded. (Menstrual status: Chemotherapy). Menarche: 40 years old Age at first live birth: 40 years old [place] P2 LMP: No Contraceptive: Implanon in place.  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 Her husband Letitia Libra works for the [employer] as an Mining engineer. Her two children are a son 52 who hopes to be a Land and daughter 68 years old.  The patient attends full [employer] in [place].   ADVANCED DIRECTIVES: In the absence of any documents to the contrary the patient's husband is her healthcare power of attorney   HEALTH MAINTENANCE: Social History   Tobacco Use   Smoking status: Never   Smokeless tobacco: Never  Vaping Use   Vaping Use: Never used  Substance Use Topics   Alcohol use: No   Drug use: No     Colonoscopy: 2017  PAP: 2017  Bone density: No   Allergies  Allergen Reactions   Adhesive [Tape] Other (See Comments)    BLISTERS   Latex Hives and Itching   Topamax [Topiramate] Other (See Comments)    ALTERED MENTAL STATUS   Celecoxib Palpitations   Codeine Itching and Rash   Penicillins Itching and Rash        Sulfa Antibiotics Itching and Rash    Current Outpatient Medications  Medication Sig Dispense Refill   acetaZOLAMIDE (DIAMOX) 500 MG capsule Take 500 mg by mouth daily.     albuterol (PROVENTIL HFA;VENTOLIN HFA) 108 (90 Base) MCG/ACT inhaler Inhale into the lungs every 6 (six) hours as needed for wheezing or shortness of breath.     ALPRAZolam (XANAX) 0.5 MG tablet alprazolam 0.5 mg tablet     anastrozole (ARIMIDEX) 1 MG tablet Take 1 tablet (1 mg total) by mouth daily. 90 tablet 4   baclofen (LIORESAL) 10 MG tablet baclofen 10 mg tablet     benzonatate (TESSALON) 100 MG capsule benzonatate 100 mg capsule     busPIRone (BUSPAR) 7.5 MG tablet Take 7.5 mg by mouth daily.      cetirizine (ZYRTEC) 10 MG tablet Take 10 mg by mouth daily.     chlorpheniramine-HYDROcodone (TUSSIONEX) 10-8 MG/5ML SUER hydrocodone 10 mg-chlorpheniramine 8 mg/5 mL oral susp extend.rel 12hr     clarithromycin (BIAXIN) 500 MG tablet clarithromycin 500 mg tablet     Cyanocobalamin (VITAMIN B 12) 500 MCG TABS Take by  mouth.     dexlansoprazole (DEXILANT) 60 MG capsule Dexilant 60 mg capsule, delayed release     fluticasone (FLONASE) 50 MCG/ACT nasal spray fluticasone propionate 50 mcg/actuation nasal spray,suspension     fluticasone (FLOVENT HFA) 220 MCG/ACT inhaler      gabapentin (NEURONTIN) 300 MG capsule TAKE (1) CAPSULE BY MOUTH AT BEDTIME. 30 capsule 3  hydrocortisone (PROCTOSOL HC) 2.5 % rectal cream Proctosol HC 2.5 % topical cream perineal applicator     hydrOXYzine (VISTARIL) 25 MG capsule hydroxyzine pamoate 25 mg capsule     meloxicam (MOBIC) 15 MG tablet Take 1 tablet (15 mg total) by mouth daily. 30 tablet 3   omeprazole (PRILOSEC) 40 MG capsule omeprazole 40 mg capsule,delayed release     pantoprazole (PROTONIX) 40 MG tablet Take 40 mg by mouth daily.     phenazopyridine (PYRIDIUM) 200 MG tablet phenazopyridine 200 mg tablet     phentermine (ADIPEX-P) 37.5 MG tablet Take 37.5 mg by mouth every morning.     Phentermine-Topiramate (QSYMIA) 3.75-23 MG CP24 Qsymia 3.75 mg-23 mg capsule, extended release  TK 1 C PO Q DAY     Sod Picosulfate-Mag Ox-Cit Acd (PREPOPIK) 10-3.5-12 MG-GM-GM PACK Prepopik 10 mg-3.5 gram-12 gram oral powder packet     tiZANidine (ZANAFLEX) 4 MG tablet tizanidine 4 mg tablet     Vitamin D, Ergocalciferol, 50 MCG (2000 UT) CAPS Take by mouth. 30 capsule    zonisamide (ZONEGRAN) 25 MG capsule Take 50 mg by mouth daily.      No current facility-administered medications for this visit.    OBJECTIVE: white woman who looks younger than stated age  48:   06/06/21 1503  BP: 118/75  Pulse: 83  Resp: 18  Temp: (!) 97.5 F (36.4 C)  SpO2: 100%     Body mass index is 43.23 kg/m.   Wt Readings from Last 3 Encounters:  06/06/21 259 lb 12.8 oz (117.8 kg)  11/29/20 267 lb 3.2 oz (121.2 kg)  12/18/19 265 lb 9.6 oz (120.5 kg)      ECOG FS:1 - Symptomatic but completely ambulatory  Sclerae unicteric, EOMs intact Wearing a mask No cervical or supraclavicular  adenopathy Lungs no rales or rhonchi Heart regular rate and rhythm Abd soft, obese, nontender, positive bowel sounds MSK no focal spinal tenderness, no upper extremity lymphedema Neuro: nonfocal, well oriented, appropriate affect Breasts: The right breast is benign.  The left breast is status postlumpectomy and radiation with no evidence of disease recurrence.  Both axillae are benign   LAB RESULTS:  CMP     Component Value Date/Time   NA 140 02/24/2021 1532   K 3.9 02/24/2021 1532   CL 110 02/24/2021 1532   CO2 22 02/24/2021 1532   GLUCOSE 142 (H) 02/24/2021 1532   BUN 13 02/24/2021 1532   CREATININE 1.10 (H) 02/24/2021 1532   CREATININE 1.05 (H) 05/22/2018 1158   CREATININE 1.08 08/22/2013 1039   CALCIUM 9.0 02/24/2021 1532   PROT 6.6 02/24/2021 1532   ALBUMIN 3.6 02/24/2021 1532   AST 66 (H) 02/24/2021 1532   AST 19 05/22/2018 1158   ALT 89 (H) 02/24/2021 1532   ALT 25 05/22/2018 1158   ALKPHOS 70 02/24/2021 1532   BILITOT 0.4 02/24/2021 1532   BILITOT 0.6 05/22/2018 1158   GFRNONAA >60 02/24/2021 1532   GFRNONAA >60 05/22/2018 1158   GFRAA 57 (L) 12/18/2019 1410   GFRAA >60 05/22/2018 1158    No results found for: TOTALPROTELP, ALBUMINELP, A1GS, A2GS, BETS, BETA2SER, GAMS, MSPIKE, SPEI  No results found for: KPAFRELGTCHN, LAMBDASER, KAPLAMBRATIO  Lab Results  Component Value Date   WBC 7.8 06/06/2021   NEUTROABS 4.3 06/06/2021   HGB 14.6 06/06/2021   HCT 42.8 06/06/2021   MCV 94.1 06/06/2021   PLT 232 06/06/2021   No results found for: LABCA2  No components found for: [id number]  No results for input(s): INR in the last 168 hours.  No results found for: LABCA2  No results found for: PPJ093  No results found for: OIZ124  No results found for: PYK998  No results found for: CA2729  No components found for: HGQUANT  No results found for: CEA1 / No results found for: CEA1   No results found for: AFPTUMOR  No results found for: CHROMOGRNA  No  results found for: HGBA, HGBA2QUANT, HGBFQUANT, HGBSQUAN (Hemoglobinopathy evaluation)   No results found for: LDH  No results found for: IRON, TIBC, IRONPCTSAT (Iron and TIBC)  No results found for: FERRITIN  Urinalysis No results found for: COLORURINE, APPEARANCEUR, LABSPEC, PHURINE, GLUCOSEU, HGBUR, BILIRUBINUR, KETONESUR, PROTEINUR, UROBILINOGEN, NITRITE, LEUKOCYTESUR   STUDIES:  No results found.   ELIGIBLE FOR AVAILABLE RESEARCH PROTOCOL: no   ASSESSMENT: 40 y.o. [facility] woman, status post left breast biopsy x2 on 05/07/2018, showing (a) left breast upper inner quadrant 2 cm from the nipple: a clinical T1b N0, stage IA invasive ductal carcinoma, grade 1, estrogen and progesterone receptor strongly positive, HER-2 not amplified, with an MIB-1 of 10%; strongly E-cadherin positive (b) left breast upper inner quadrant 3 cm from the nipple, a clinical T1c N0, stage IA invasive ductal carcinoma, grade 2, estrogen receptor positive with moderate staining intensity, progesterone receptor positive with strong staining intensity, with an MIB-1 of 10%, and no HER-2 amplification; faint but positive E-cadherin stain  (1) genetics testing 05/28/2018 through the Multi-Cancer Panel offered by [facility] showed no deleterious mutations in AIP, ALK, APC, ATM, AXIN2, BAP1, BARD1, BLM, BMPR1A, BRCA1, BRCA2, BRIP1, BUB1B, CASR, CDC73, CDH1, CDK4, CDKN1B, CDKN1C, CDKN2A, CEBPA, CEP57, CHEK2, CTNNA1, DICER1, DIS3L2, EGFR, ENG, EPCAM, FH, FLCN, GALNT12, GATA2, GPC3, GREM1, HOXB13, HRAS, KIT, MAX, MEN1, MET, MITF, MLH1, MLH3, MSH2, MSH3, MSH6, MUTYH, NBN, NF1, NF2, NTHL1, PALB2, PDGFRA, PHOX2B, PMS2, POLD1, POLE, POT1, PRKAR1A, PTCH1, PTEN, RAD50, RAD51C, RAD51D, RB1, RECQL4, RET, RNF43, RPS20, RUNX1, SDHA, SDHAF2, SDHB, SDHC, SDHD, SMAD4, SMARCA4, SMARCB1, SMARCE1, STK11, SUFU, TERC, TERT, TMEM127, TP53, TSC1, TSC2, VHL, WRN, WT1  (2) status post left lumpectomy and sentinel lymph node  sampling 06/24/2018 for a pT2 pN1, stage IIA invasive ductal carcinoma, grade 1, with negative margins  (a) a total of 2 sentinel lymph nodes were removed  (3) MammaPrint read as low risk, with an average 10-year risk of recurrence with no treatment of 10%, the 5-year disease-free survival being 97.8% with hormone treatment alone  (4) adjuvant radiation 07/31/2018 - 09/17/2018  (a) Left Breast and IM nodes/ 50.4 Gy in 28 fractions of 1.8 Gy  (b) Left post axilla/ supraclavicular / 50.4 Gy in 28 fractions of 1.8 Gy  (c) Boost / 10 Gy in 5 fractions of 2 Gy  (5) anastrozole started 09/19/2018  (a) goserelin started 09/25/2018, initially monthly  (b) goserelin changed to every 3 months beginning 12/18/2019   PLAN: Mikael is now just about 3 years out from definitive surgery for her breast cancer with no evidence of disease recurrence.  This is very favorable.  She is tolerating the anastrozole generally well and the plan will be to continue that a total of 5 years.  She is already worrying about what to do when we stopped the goserelin 2 years from now.  She is concerned about hormone birth control of course.  I think a good option for her would be a copper IUD.  She will start discussing that with her gynecologist.  I encouraged her to go for  7500 steps a day, which would be about 3 miles a day.  She is working hard at losing weight.  She will return in 3 months for her goserelin and in 6 months for goserelin and a visit.  She knows to call for any other issue that may develop before then.  Total encounter time 25 minutes.*   Toccara Alford, Virgie Dad, MD  06/06/21 3:12 PM [facility] [address] [place], [place] [address] Tel. [phone]    Fax. [phone]    I, Wilburn Mylar, am acting as scribe for Dr. Virgie Dad. Antonetta Clanton.  I, Lurline Del MD, have reviewed the above documentation for accuracy and completeness, and I agree  with the above.   *Total Encounter Time as defined by the Centers for Medicare and Medicaid Services includes, in addition to the face-to-face time of a patient visit (documented in the note above) non-face-to-face time: obtaining and reviewing outside history, ordering and reviewing medications, tests or procedures, care coordination (communications with other health care professionals or caregivers) and documentation in the medical record.

## 2021-06-06 ENCOUNTER — Inpatient Hospital Stay: Payer: 59 | Attending: Oncology

## 2021-06-06 ENCOUNTER — Other Ambulatory Visit: Payer: Self-pay

## 2021-06-06 ENCOUNTER — Inpatient Hospital Stay: Payer: 59

## 2021-06-06 ENCOUNTER — Inpatient Hospital Stay (HOSPITAL_BASED_OUTPATIENT_CLINIC_OR_DEPARTMENT_OTHER): Payer: 59 | Admitting: Oncology

## 2021-06-06 VITALS — BP 118/75 | HR 83 | Temp 97.5°F | Resp 18 | Ht 65.0 in | Wt 259.8 lb

## 2021-06-06 DIAGNOSIS — Z5111 Encounter for antineoplastic chemotherapy: Secondary | ICD-10-CM | POA: Diagnosis not present

## 2021-06-06 DIAGNOSIS — C50812 Malignant neoplasm of overlapping sites of left female breast: Secondary | ICD-10-CM | POA: Insufficient documentation

## 2021-06-06 DIAGNOSIS — Z923 Personal history of irradiation: Secondary | ICD-10-CM | POA: Insufficient documentation

## 2021-06-06 DIAGNOSIS — K219 Gastro-esophageal reflux disease without esophagitis: Secondary | ICD-10-CM | POA: Diagnosis not present

## 2021-06-06 DIAGNOSIS — Z170 Estrogen receptor positive status [ER+]: Secondary | ICD-10-CM | POA: Diagnosis not present

## 2021-06-06 DIAGNOSIS — Z79811 Long term (current) use of aromatase inhibitors: Secondary | ICD-10-CM | POA: Diagnosis not present

## 2021-06-06 DIAGNOSIS — Z8052 Family history of malignant neoplasm of bladder: Secondary | ICD-10-CM | POA: Insufficient documentation

## 2021-06-06 DIAGNOSIS — R232 Flushing: Secondary | ICD-10-CM | POA: Insufficient documentation

## 2021-06-06 DIAGNOSIS — J45909 Unspecified asthma, uncomplicated: Secondary | ICD-10-CM | POA: Insufficient documentation

## 2021-06-06 DIAGNOSIS — Z8051 Family history of malignant neoplasm of kidney: Secondary | ICD-10-CM | POA: Insufficient documentation

## 2021-06-06 DIAGNOSIS — Z801 Family history of malignant neoplasm of trachea, bronchus and lung: Secondary | ICD-10-CM | POA: Insufficient documentation

## 2021-06-06 DIAGNOSIS — Z800 Family history of malignant neoplasm of digestive organs: Secondary | ICD-10-CM | POA: Insufficient documentation

## 2021-06-06 LAB — CBC WITH DIFFERENTIAL/PLATELET
Abs Immature Granulocytes: 0.01 10*3/uL (ref 0.00–0.07)
Basophils Absolute: 0.1 10*3/uL (ref 0.0–0.1)
Basophils Relative: 1 %
Eosinophils Absolute: 0.4 10*3/uL (ref 0.0–0.5)
Eosinophils Relative: 5 %
HCT: 42.8 % (ref 36.0–46.0)
Hemoglobin: 14.6 g/dL (ref 12.0–15.0)
Immature Granulocytes: 0 %
Lymphocytes Relative: 31 %
Lymphs Abs: 2.5 10*3/uL (ref 0.7–4.0)
MCH: 32.1 pg (ref 26.0–34.0)
MCHC: 34.1 g/dL (ref 30.0–36.0)
MCV: 94.1 fL (ref 80.0–100.0)
Monocytes Absolute: 0.6 10*3/uL (ref 0.1–1.0)
Monocytes Relative: 8 %
Neutro Abs: 4.3 10*3/uL (ref 1.7–7.7)
Neutrophils Relative %: 55 %
Platelets: 232 10*3/uL (ref 150–400)
RBC: 4.55 MIL/uL (ref 3.87–5.11)
RDW: 12.8 % (ref 11.5–15.5)
WBC: 7.8 10*3/uL (ref 4.0–10.5)
nRBC: 0 % (ref 0.0–0.2)

## 2021-06-06 LAB — COMPREHENSIVE METABOLIC PANEL
ALT: 54 U/L — ABNORMAL HIGH (ref 0–44)
AST: 35 U/L (ref 15–41)
Albumin: 3.9 g/dL (ref 3.5–5.0)
Alkaline Phosphatase: 68 U/L (ref 38–126)
Anion gap: 7 (ref 5–15)
BUN: 13 mg/dL (ref 6–20)
CO2: 23 mmol/L (ref 22–32)
Calcium: 9 mg/dL (ref 8.9–10.3)
Chloride: 111 mmol/L (ref 98–111)
Creatinine, Ser: 1.23 mg/dL — ABNORMAL HIGH (ref 0.44–1.00)
GFR, Estimated: 57 mL/min — ABNORMAL LOW (ref >60)
Glucose, Bld: 79 mg/dL (ref 70–99)
Potassium: 3.6 mmol/L (ref 3.5–5.1)
Sodium: 141 mmol/L (ref 135–145)
Total Bilirubin: 0.5 mg/dL (ref 0.3–1.2)
Total Protein: 6.8 g/dL (ref 6.5–8.1)

## 2021-06-06 MED ORDER — GOSERELIN ACETATE 10.8 MG ~~LOC~~ IMPL
10.8000 mg | DRUG_IMPLANT | Freq: Once | SUBCUTANEOUS | Status: AC
  Administered 2021-06-06: 10.8 mg via SUBCUTANEOUS
  Filled 2021-06-06: qty 10.8

## 2021-06-06 NOTE — Addendum Note (Signed)
Addended by: Chauncey Cruel on: 06/06/2021 03:39 PM   Modules accepted: Orders

## 2021-06-06 NOTE — Patient Instructions (Signed)
Goserelin injection °What is this medication? °GOSERELIN (GOE se rel in) is similar to a hormone found in the body. It lowers the amount of sex hormones that the body makes. Men will have lower testosterone levels and women will have lower estrogen levels while taking this medicine. In men, this medicine is used to treat prostate cancer; the injection is either given once per month or once every 12 weeks. A once per month injection (only) is used to treat women with endometriosis, dysfunctional uterine bleeding, or advanced breast cancer. °This medicine may be used for other purposes; ask your health care provider or pharmacist if you have questions. °COMMON BRAND NAME(S): Zoladex, Zoladex 3-Month °What should I tell my care team before I take this medication? °They need to know if you have any of these conditions: °bone problems °diabetes °heart disease °history of irregular heartbeat °an unusual or allergic reaction to goserelin, other medicines, foods, dyes, or preservatives °pregnant or trying to get pregnant °breast-feeding °How should I use this medication? °This medicine is for injection under the skin. It is given by a health care professional in a hospital or clinic setting. °Talk to your pediatrician regarding the use of this medicine in children. Special care may be needed. °Overdosage: If you think you have taken too much of this medicine contact a poison control center or emergency room at once. °NOTE: This medicine is only for you. Do not share this medicine with others. °What if I miss a dose? °It is important not to miss your dose. Call your doctor or health care professional if you are unable to keep an appointment. °What may interact with this medication? °Do not take this medicine with any of the following medications: °cisapride °dronedarone °pimozide °thioridazine °This medicine may also interact with the following medications: °other medicines that prolong the QT interval (an abnormal heart  rhythm) °This list may not describe all possible interactions. Give your health care provider a list of all the medicines, herbs, non-prescription drugs, or dietary supplements you use. Also tell them if you smoke, drink alcohol, or use illegal drugs. Some items may interact with your medicine. °What should I watch for while using this medication? °Visit your doctor or health care provider for regular checks on your progress. Your symptoms may appear to get worse during the first weeks of this therapy. Tell your doctor or healthcare provider if your symptoms do not start to get better or if they get worse after this time. °Your bones may get weaker if you take this medicine for a long time. If you smoke or frequently drink alcohol you may increase your risk of bone loss. A family history of osteoporosis, chronic use of drugs for seizures (convulsions), or corticosteroids can also increase your risk of bone loss. Talk to your doctor about how to keep your bones strong. °This medicine should stop regular monthly menstruation in women. Tell your doctor if you continue to menstruate. °Women should not become pregnant while taking this medicine or for 12 weeks after stopping this medicine. Women should inform their doctor if they wish to become pregnant or think they might be pregnant. There is a potential for serious side effects to an unborn child. Talk to your health care professional or pharmacist for more information. Do not breast-feed an infant while taking this medicine. °Men should inform their doctors if they wish to father a child. This medicine may lower sperm counts. Talk to your health care professional or pharmacist for more information. °This   medicine may increase blood sugar. Ask your healthcare provider if changes in diet or medicines are needed if you have diabetes. °What side effects may I notice from receiving this medication? °Side effects that you should report to your doctor or health care  professional as soon as possible: °allergic reactions like skin rash, itching or hives, swelling of the face, lips, or tongue °bone pain °breathing problems °changes in vision °chest pain °feeling faint or lightheaded, falls °fever, chills °pain, swelling, warmth in the leg °pain, tingling, numbness in the hands or feet °signs and symptoms of high blood sugar such as being more thirsty or hungry or having to urinate more than normal. You may also feel very tired or have blurry vision °signs and symptoms of low blood pressure like dizziness; feeling faint or lightheaded, falls; unusually weak or tired °stomach pain °swelling of the ankles, feet, hands °trouble passing urine or change in the amount of urine °unusually high or low blood pressure °unusually weak or tired °Side effects that usually do not require medical attention (report to your doctor or health care professional if they continue or are bothersome): °change in sex drive or performance °changes in breast size in both males and females °changes in emotions or moods °headache °hot flashes °irritation at site where injected °loss of appetite °skin problems like acne, dry skin °vaginal dryness °This list may not describe all possible side effects. Call your doctor for medical advice about side effects. You may report side effects to FDA at 1-800-FDA-1088. °Where should I keep my medication? °This drug is given in a hospital or clinic and will not be stored at home. °NOTE: This sheet is a summary. It may not cover all possible information. If you have questions about this medicine, talk to your doctor, pharmacist, or health care provider. °© 2022 [employer]/Gold Standard (2018-11-01 00:00:00) ° °

## 2021-06-17 ENCOUNTER — Ambulatory Visit
Admission: RE | Admit: 2021-06-17 | Discharge: 2021-06-17 | Disposition: A | Discharge status: Home / Self Care | Payer: 59 | Source: Ambulatory Visit | Attending: Oncology | Admitting: Oncology

## 2021-06-17 ENCOUNTER — Other Ambulatory Visit: Payer: Self-pay | Admitting: Oncology

## 2021-06-17 DIAGNOSIS — Z853 Personal history of malignant neoplasm of breast: Secondary | ICD-10-CM

## 2021-08-04 ENCOUNTER — Other Ambulatory Visit (HOSPITAL_COMMUNITY)
Admission: RE | Admit: 2021-08-04 | Discharge: 2021-08-04 | Disposition: A | Discharge status: Home / Self Care | Payer: 59 | Source: Ambulatory Visit | Attending: Adult Health | Admitting: Adult Health

## 2021-08-04 ENCOUNTER — Encounter: Payer: Self-pay | Admitting: Adult Health

## 2021-08-04 ENCOUNTER — Ambulatory Visit (INDEPENDENT_AMBULATORY_CARE_PROVIDER_SITE_OTHER): Payer: 59 | Admitting: Adult Health

## 2021-08-04 ENCOUNTER — Other Ambulatory Visit: Payer: Self-pay

## 2021-08-04 VITALS — BP 115/75 | HR 71 | Ht 65.0 in | Wt 247.2 lb

## 2021-08-04 DIAGNOSIS — Z1211 Encounter for screening for malignant neoplasm of colon: Secondary | ICD-10-CM | POA: Insufficient documentation

## 2021-08-04 DIAGNOSIS — Z853 Personal history of malignant neoplasm of breast: Secondary | ICD-10-CM

## 2021-08-04 DIAGNOSIS — Z01419 Encounter for gynecological examination (general) (routine) without abnormal findings: Secondary | ICD-10-CM | POA: Insufficient documentation

## 2021-08-04 LAB — HEMOCCULT GUIAC POC 1CARD (OFFICE): Fecal Occult Blood, POC: NEGATIVE

## 2021-08-04 NOTE — Progress Notes (Signed)
Patient ID: Breanna [name], female   DOB: February 14, 1981, 41 y.o.   MRN: [id number] History of Present Illness: Breanna [name] is a 41 year old white female,married, [id number], in for well woman gyn exam and pap. She had breast cancer diagnosed in 2019 and is on Arimidex. She is on mounjaro for weight loss and has lost 28 labs since September, but has some GI upset with higher dose.  She is working nights.  She sees MD at [facility] for breast.  PCP is Rowan Blase PA.   Current Medications, Allergies, Past Medical History, Past Surgical History, Family History and Social History were reviewed in [employer] record.     Review of Systems: Patient denies any headaches, hearing loss, fatigue, blurred vision, shortness of breath, chest pain, abdominal pain, problems with bowel movements, urination, or intercourse. No joint pain or mood swings. Does not sleep well at times  No periods since chemo.    Physical Exam:BP 115/75 (BP Location: Right Arm, Patient Position: Sitting, Cuff Size: Normal)    Pulse 71    Ht 5\' 5"  (1.651 m)    Wt 247 lb 3.2 oz (112.1 kg)    BMI 41.14 kg/m   General:  Well developed, well nourished, no acute distress Skin:  Warm and dry Neck:  Midline trachea, normal thyroid, good ROM, no lymphadenopathy Lungs; Clear to auscultation bilaterally Breast:  No dominant palpable mass, retraction, or nipple discharge, has thickness at scar on left breast, had normal mammogram 06/17/21 Cardiovascular: Regular rate and rhythm Abdomen:  Soft, non tender, no hepatosplenomegaly Pelvic:  External genitalia is normal in appearance, no lesions.  The vagina is normal in appearance. Urethra has no lesions or masses. The cervix is smooth. Pap with HR HPV genotyping performed.  Uterus is felt to be normal size, shape, and contour.  No adnexal masses or tenderness noted.Bladder is non tender, no masses felt. Rectal: Good sphincter tone, no polyps,  +hemorrhoids felt.  Hemoccult  negative. Extremities/musculoskeletal:  No swelling or varicosities noted, no clubbing or cyanosis Psych:  No mood changes, alert and cooperative,seems happy AA is 0 Fall risk is low Depression screen PHQ 2/9 08/04/2021  Decreased Interest 0  Down, Depressed, Hopeless 0  PHQ - 2 Score 0  Altered sleeping 2  Tired, decreased energy 2  Change in appetite 2  Feeling bad or failure about yourself  1  Trouble concentrating 0  Moving slowly or fidgety/restless 0  Suicidal thoughts 0  PHQ-9 Score 7   She is on buspar GAD 7 : Generalized Anxiety Score 08/04/2021  Nervous, Anxious, on Edge 1  Control/stop worrying 0  Worry too much - different things 0  Trouble relaxing 1  Restless 0  Easily annoyed or irritable 1  Afraid - awful might happen 0  Total GAD 7 Score 3      Upstream - 08/04/21 1443       Pregnancy Intention Screening   Does the patient want to become pregnant in the next year? No    Does the patient's partner want to become pregnant in the next year? No    Would the patient like to discuss contraceptive options today? No      Contraception Wrap Up   Current Method --   chemotherapy   End Method --   chemo   Contraception Counseling Provided No            Examination chaperoned by Celene Squibb LPN   Impression and Plan: 1. Encounter  for gynecological examination with Papanicolaou smear of cervix Pap sent Pap in 3 years if normal, will see you in 3 years for pap or sooner if needed  Physical with PCP Labs with PCP   2. Encounter for screening fecal occult blood testing   3. History of breast cancer Mammogram yearly  On Arimidex

## 2021-08-09 LAB — CYTOLOGY - PAP
Comment: NEGATIVE
Diagnosis: NEGATIVE
High risk HPV: NEGATIVE

## 2021-08-29 ENCOUNTER — Inpatient Hospital Stay: Payer: 59 | Attending: Oncology

## 2021-08-29 ENCOUNTER — Other Ambulatory Visit: Payer: Self-pay

## 2021-08-29 ENCOUNTER — Other Ambulatory Visit: Payer: Self-pay | Admitting: Hematology and Oncology

## 2021-08-29 VITALS — BP 128/82 | HR 83 | Temp 98.3°F | Resp 18

## 2021-08-29 DIAGNOSIS — Z5111 Encounter for antineoplastic chemotherapy: Secondary | ICD-10-CM | POA: Insufficient documentation

## 2021-08-29 DIAGNOSIS — C50212 Malignant neoplasm of upper-inner quadrant of left female breast: Secondary | ICD-10-CM | POA: Diagnosis not present

## 2021-08-29 DIAGNOSIS — Z170 Estrogen receptor positive status [ER+]: Secondary | ICD-10-CM | POA: Insufficient documentation

## 2021-08-29 MED ORDER — GOSERELIN ACETATE 10.8 MG ~~LOC~~ IMPL
10.8000 mg | DRUG_IMPLANT | Freq: Once | SUBCUTANEOUS | Status: AC
  Administered 2021-08-29: 10.8 mg via SUBCUTANEOUS
  Filled 2021-08-29: qty 10.8

## 2021-10-11 ENCOUNTER — Encounter: Payer: Self-pay | Admitting: Hematology and Oncology

## 2021-11-07 ENCOUNTER — Telehealth: Payer: Self-pay | Admitting: Hematology and Oncology

## 2021-11-07 NOTE — Telephone Encounter (Signed)
Rescheduled appointment per providers. Patient aware.  ? ?

## 2021-11-21 ENCOUNTER — Inpatient Hospital Stay (HOSPITAL_BASED_OUTPATIENT_CLINIC_OR_DEPARTMENT_OTHER): Payer: 59 | Admitting: Hematology and Oncology

## 2021-11-21 ENCOUNTER — Inpatient Hospital Stay: Payer: 59 | Attending: Oncology

## 2021-11-21 ENCOUNTER — Inpatient Hospital Stay: Payer: 59

## 2021-11-21 ENCOUNTER — Other Ambulatory Visit: Payer: Self-pay

## 2021-11-21 ENCOUNTER — Other Ambulatory Visit: Payer: Self-pay | Admitting: *Deleted

## 2021-11-21 VITALS — BP 118/96 | HR 82 | Temp 97.7°F | Wt 238.4 lb

## 2021-11-21 DIAGNOSIS — Z8051 Family history of malignant neoplasm of kidney: Secondary | ICD-10-CM | POA: Insufficient documentation

## 2021-11-21 DIAGNOSIS — Z923 Personal history of irradiation: Secondary | ICD-10-CM | POA: Insufficient documentation

## 2021-11-21 DIAGNOSIS — J45909 Unspecified asthma, uncomplicated: Secondary | ICD-10-CM | POA: Insufficient documentation

## 2021-11-21 DIAGNOSIS — C50812 Malignant neoplasm of overlapping sites of left female breast: Secondary | ICD-10-CM

## 2021-11-21 DIAGNOSIS — Z170 Estrogen receptor positive status [ER+]: Secondary | ICD-10-CM | POA: Diagnosis not present

## 2021-11-21 DIAGNOSIS — Z8052 Family history of malignant neoplasm of bladder: Secondary | ICD-10-CM | POA: Insufficient documentation

## 2021-11-21 DIAGNOSIS — Z5111 Encounter for antineoplastic chemotherapy: Secondary | ICD-10-CM | POA: Diagnosis present

## 2021-11-21 DIAGNOSIS — R232 Flushing: Secondary | ICD-10-CM | POA: Insufficient documentation

## 2021-11-21 DIAGNOSIS — K219 Gastro-esophageal reflux disease without esophagitis: Secondary | ICD-10-CM | POA: Diagnosis not present

## 2021-11-21 DIAGNOSIS — Z801 Family history of malignant neoplasm of trachea, bronchus and lung: Secondary | ICD-10-CM | POA: Insufficient documentation

## 2021-11-21 DIAGNOSIS — C50212 Malignant neoplasm of upper-inner quadrant of left female breast: Secondary | ICD-10-CM | POA: Diagnosis not present

## 2021-11-21 DIAGNOSIS — Z79811 Long term (current) use of aromatase inhibitors: Secondary | ICD-10-CM | POA: Insufficient documentation

## 2021-11-21 DIAGNOSIS — Z79899 Other long term (current) drug therapy: Secondary | ICD-10-CM | POA: Diagnosis not present

## 2021-11-21 DIAGNOSIS — Z800 Family history of malignant neoplasm of digestive organs: Secondary | ICD-10-CM | POA: Diagnosis not present

## 2021-11-21 LAB — CMP (CANCER CENTER ONLY)
ALT: 19 U/L (ref 0–44)
AST: 20 U/L (ref 15–41)
Albumin: 4.2 g/dL (ref 3.5–5.0)
Alkaline Phosphatase: 57 U/L (ref 38–126)
Anion gap: 8 (ref 5–15)
BUN: 10 mg/dL (ref 6–20)
CO2: 23 mmol/L (ref 22–32)
Calcium: 9.4 mg/dL (ref 8.9–10.3)
Chloride: 109 mmol/L (ref 98–111)
Creatinine: 1.13 mg/dL — ABNORMAL HIGH (ref 0.44–1.00)
GFR, Estimated: >60 mL/min (ref >60)
Glucose, Bld: 109 mg/dL — ABNORMAL HIGH (ref 70–99)
Potassium: 3.9 mmol/L (ref 3.5–5.1)
Sodium: 140 mmol/L (ref 135–145)
Total Bilirubin: 0.7 mg/dL (ref 0.3–1.2)
Total Protein: 7.5 g/dL (ref 6.5–8.1)

## 2021-11-21 LAB — CBC WITH DIFFERENTIAL (CANCER CENTER ONLY)
Abs Immature Granulocytes: 0.01 10*3/uL (ref 0.00–0.07)
Basophils Absolute: 0 10*3/uL (ref 0.0–0.1)
Basophils Relative: 1 %
Eosinophils Absolute: 0.4 10*3/uL (ref 0.0–0.5)
Eosinophils Relative: 6 %
HCT: 44 % (ref 36.0–46.0)
Hemoglobin: 15.2 g/dL — ABNORMAL HIGH (ref 12.0–15.0)
Immature Granulocytes: 0 %
Lymphocytes Relative: 25 %
Lymphs Abs: 1.8 10*3/uL (ref 0.7–4.0)
MCH: 31.6 pg (ref 26.0–34.0)
MCHC: 34.5 g/dL (ref 30.0–36.0)
MCV: 91.5 fL (ref 80.0–100.0)
Monocytes Absolute: 0.4 10*3/uL (ref 0.1–1.0)
Monocytes Relative: 6 %
Neutro Abs: 4.4 10*3/uL (ref 1.7–7.7)
Neutrophils Relative %: 62 %
Platelet Count: 230 10*3/uL (ref 150–400)
RBC: 4.81 MIL/uL (ref 3.87–5.11)
RDW: 12.1 % (ref 11.5–15.5)
WBC Count: 7.1 10*3/uL (ref 4.0–10.5)
nRBC: 0 % (ref 0.0–0.2)

## 2021-11-21 MED ORDER — GOSERELIN ACETATE 10.8 MG ~~LOC~~ IMPL
10.8000 mg | DRUG_IMPLANT | Freq: Once | SUBCUTANEOUS | Status: AC
  Administered 2021-11-21: 10.8 mg via SUBCUTANEOUS
  Filled 2021-11-21: qty 10.8

## 2021-11-21 NOTE — Progress Notes (Signed)
?[place]  ?[phone] [phone]  ? ? ?ID: Breanna [name] DOB: [birth year]  MR#: [id number]  KYH#:[id number] ? ?Patient Care Team: ?Ginger Organ (Inactive) as PCP - General (Physician Assistant) ?Fields, Marga Melnick, MD (Inactive) as Consulting Physician (Gastroenterology) ?Magrinat, Virgie Dad, MD (Inactive) as Consulting Physician (Oncology) ?Eppie Gibson, MD as Attending Physician (Radiation Oncology) ?Fanny Skates, MD as Consulting Physician (General Surgery) ?Estill Dooms, NP as Nurse Practitioner (Obstetrics and Gynecology) ?OTHER MD: ? ? ?CHIEF COMPLAINT: Estrogen receptor positive multifocal breast cancer ? ?CURRENT TREATMENT: goserelin/Zoladex; anastrozole ? ? ?INTERVAL HISTORY: ? ?Breanna [name] returns today for follow-up of her estrogen receptor positive multifocal breast cancer.  She is here with her husband.  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  She continues on ovarian suppression as well as anastrozole for antiestrogen therapy and has been tolerating it relatively well except for some vaginal dryness.  She also feels that she has more hot flashes when she is getting ready for her next injection.  She denies any other concerns today for [place].  She did wonder about the extended role of antiestrogen therapy.  She had a mammogram in December 2022, denies any new breast changes. ?Rest of the pertinent 10 point ROS reviewed and negative ? ? ?HISTORY OF CURRENT ILLNESS: ?From the original intake note: ? ?Breanna [name] noted a palpable abnormality in the 4 o'clock location of the LEFT breast sometime in September 2019.  She brought it up to medical attention and underwent bilateral diagnostic mammography with tomography and left breast ultrasonography at [facility] on 04/23/2018 showing: a Suspicious mass in the 9:30 o'clock location of the LEFT breast 2 centimeters from the nipple; by ultrasound this was an irregular  hypoechoic mass with spiculated margins and posterior acoustic shadowing which measures 0.5 x 0.7 x 1.1 centimeters. A second similar lesion is also suspected in the same portion of the breast but has not been completely evaluated. Recommend targeted ultrasound of the MEDIAL aspect of the LEFT breast at the time of patient's biopsy and possible second biopsy. LEFT axilla is negative for adenopathy. The areas of clinical concern in the LOWER OUTER QUADRANT of the LEFT breast in the [place] 10 centimeters from the nipple are negative by mammogram and ultrasound. ? ?Accordingly on 05/07/2018 the patient proceeded to biopsy of the left breast areas in question. The pathology from this procedure showed ([id number]):  ?1. Breast, left, needle core biopsy, at 9:30 o'clock 3 cm from the nipple: invasive mammary carcinoma, grade II. Mammary carcinoma in situ.  ?2. Breast, left, needle core biopsy, 9:30 o'clock 2 cm from the nipple with invasive ductal carcinoma, grade I. Mammary carcinoma in situ.  E-cadherin stains are faint in the larger tumor but still positive, strong in the second tumor, and thus these are read as ductal. ? ?Prognostic indicators significant for:  ?1. Estrogen receptor, 80% positive, moderate staining intensity and progesterone receptor, 80% positive, strong staining intensity. Proliferation marker Ki67 at 10%. HER2 negative by immunohistochemistry, 1+.   ?2. Estrogen Receptor: 90%, positive, strong staining intensity and progesterone Receptor: 90%, positive, strong staining intensity. Proliferation Marker Ki67: 10%. HER2 negative by immunohistochemistry, 1+.   ? ?The patient's subsequent history is as detailed below. ? ? ?PAST MEDICAL HISTORY: ?Past Medical History:  ?Diagnosis Date  ? Anxiety   ? Arthritis   ? knees  ? Asthma   ? Breast cancer ([place]) 05/2018  ? left   ?  Cluster headaches   ? Cough 06/11/2018  ? Dental crown present   ? Family history of bladder cancer   ? Family history of  kidney cancer   ? Family history of lung cancer   ? Family history of stomach cancer   ? GERD (gastroesophageal reflux disease)   ? Heart palpitations   ? occasional - no cardiologist  ? History of asthma   ? as a child - prn inhaler  ? History of radiation therapy 07/31/18- 09/17/18  ? Left Breast / 50.4 Gy in 28 fractions of 1.8 Gy, Left Supraclavicular / 50.4 Gy in 28 fractions of 1.8 Gy, and boost of 10 Gy in 5 fractions of 2 Gy each.   ? Obesity   ? Personal history of radiation therapy 07/2018  ? Pseudotumor cerebri syndrome   ? Seasonal allergies   ? Stuffy nose 06/11/2018  ? ? ?PAST SURGICAL HISTORY: ?Past Surgical History:  ?Procedure Laterality Date  ? BIOPSY  11/08/2015  ? Procedure: BIOPSY;  Surgeon: Danie Binder, MD;  [facility];  Service: Endoscopy;;  Gastric bx and Gastric polyp bx  ? BREAST BIOPSY Left 05/07/2018  ? x2  ? BREAST LUMPECTOMY Left 06/24/2018  ? BREAST LUMPECTOMY WITH RADIOACTIVE SEED AND SENTINEL LYMPH NODE BIOPSY Left 06/24/2018  ? Procedure: LEFT BREAST LUMPECTOMY WITH  BRACKETED RADIOACTIVE SEED AND LEFT AXILLARY DEEP SENTINEL LYMPH NODE BIOPSY AND BLUE DYE INJECTION;  Surgeon: Fanny Skates, MD;  Location: [facility];  Service: General;  Laterality: Left;  ? CESAREAN SECTION  11/03/2003  ? CESAREAN SECTION  05/15/2006  ? CHOLECYSTECTOMY    ? COLONOSCOPY N/A 11/08/2015  ? Procedure: COLONOSCOPY;  Surgeon: Danie Binder, MD;  [facility];  Service: Endoscopy;  Laterality: N/A;  1000  ? ESOPHAGOGASTRODUODENOSCOPY N/A 11/08/2015  ? Procedure: ESOPHAGOGASTRODUODENOSCOPY (EGD);  Surgeon: Danie Binder, MD;  [facility];  Service: Endoscopy;  Laterality: N/A;  ? HEMORRHOID BANDING N/A 11/08/2015  ? Procedure: HEMORRHOID BANDING;  Surgeon: Danie Binder, MD;  [facility];  Service: Endoscopy;  Laterality: N/A;  ? SAVORY DILATION N/A 11/08/2015  ? Procedure: SAVORY DILATION;  Surgeon: Danie Binder, MD;  [facility];   Service: Endoscopy;  Laterality: N/A;  ? WISDOM TOOTH EXTRACTION    ? ? ?FAMILY HISTORY: ?Family History  ?Problem Relation Age of Onset  ? COPD Mother   ? Pneumonia Mother   ? Hyperlipidemia Father   ? Kidney cancer Father 48  ?     "simple renal cell carcinoma"  ? Heart disease Maternal Aunt   ? Hypertension Maternal Grandmother   ? Diabetes Maternal Grandmother   ? Stroke Paternal Grandmother   ? Hypertension Paternal Grandmother   ? Diabetes Paternal Grandmother   ? Stomach cancer Paternal Grandmother 31  ?     had a portion of stomah removed  ? Diabetes Paternal Grandfather   ? Heart disease Paternal Grandfather   ? Hyperlipidemia Paternal Grandfather   ? Hypertension Paternal Grandfather   ? Bladder Cancer Maternal Uncle   ?     hx smoking  ? Lung cancer Other   ?As of November 2019 her father is 18 years old. Patients' mother died from pneumonia at age 53. The patient has 0 brothers and 0 sisters. Patient denies any family story of ovarian or breast cancer. Her paternal grandmother had gastric cancer in 2000. Her paternal aunt had lung cancer without a hx of  smoking. Her maternal uncle has had bladder cancer.  ? ? ?GYNECOLOGIC HISTORY:  ?No LMP recorded. (Menstrual status: Chemotherapy). ?Menarche: 41 years old ?Age at first live birth: 41 years old ?GX P2 ?LMP: No ?Contraceptive: Implanon in place.  ?HRT:   ?Hysterectomy?: No ?BSO?: No ? ? 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 Her husband Letitia Libra works for the [employer] as an Mining engineer. Her two children are a son 46 who hopes to be a Land and daughter 55 years old.  The patient attends full [employer] in [place]. ? ? ADVANCED DIRECTIVES: In the absence of any documents to the contrary the patient's husband is her healthcare power of attorney ? ? ?HEALTH MAINTENANCE: ?Social History  ? ?Tobacco Use  ? Smoking status: Never  ? Smokeless tobacco: Never  ?Vaping Use  ? Vaping Use:  Never used  ?Substance Use Topics  ? Alcohol use: No  ? Drug use: No  ? ? ? Colonoscopy: 2017 ? PAP: 2017 ? Bone density: No ?  ?Allergies  ?Allergen Reactions  ? Adhesive [Tape] Other (See Comments)  ?  B

## 2021-11-22 ENCOUNTER — Other Ambulatory Visit: Payer: 59

## 2021-11-22 ENCOUNTER — Ambulatory Visit: Payer: 59 | Admitting: Hematology and Oncology

## 2021-11-22 ENCOUNTER — Encounter: Payer: Self-pay | Admitting: Hematology and Oncology

## 2021-11-22 ENCOUNTER — Ambulatory Visit: Payer: 59

## 2021-11-30 ENCOUNTER — Encounter: Payer: Self-pay | Admitting: Hematology and Oncology

## 2021-12-23 ENCOUNTER — Other Ambulatory Visit: Payer: Self-pay | Admitting: *Deleted

## 2021-12-23 MED ORDER — GABAPENTIN 300 MG PO CAPS: ORAL_CAPSULE | ORAL | 3 refills | Status: DC

## 2021-12-23 MED ORDER — ANASTROZOLE 1 MG PO TABS: 1.0000 mg | ORAL_TABLET | Freq: Every day | ORAL | 4 refills | Status: DC

## 2022-03-22 ENCOUNTER — Other Ambulatory Visit: Payer: Self-pay | Admitting: *Deleted

## 2022-03-22 ENCOUNTER — Telehealth: Payer: Self-pay | Admitting: Hematology and Oncology

## 2022-03-22 NOTE — Telephone Encounter (Signed)
Scheduled per 9/6 sch msg, message has been left with pt

## 2022-03-27 ENCOUNTER — Inpatient Hospital Stay: Payer: 59 | Attending: Hematology and Oncology

## 2022-03-27 ENCOUNTER — Other Ambulatory Visit: Payer: Self-pay

## 2022-03-27 VITALS — BP 118/62 | HR 83 | Temp 98.5°F | Resp 18

## 2022-03-27 DIAGNOSIS — Z5111 Encounter for antineoplastic chemotherapy: Secondary | ICD-10-CM | POA: Insufficient documentation

## 2022-03-27 DIAGNOSIS — Z170 Estrogen receptor positive status [ER+]: Secondary | ICD-10-CM

## 2022-03-27 DIAGNOSIS — C50811 Malignant neoplasm of overlapping sites of right female breast: Secondary | ICD-10-CM | POA: Insufficient documentation

## 2022-03-27 MED ORDER — GOSERELIN ACETATE 10.8 MG ~~LOC~~ IMPL
10.8000 mg | DRUG_IMPLANT | Freq: Once | SUBCUTANEOUS | Status: AC
  Administered 2022-03-27: 10.8 mg via SUBCUTANEOUS
  Filled 2022-03-27: qty 10.8

## 2022-04-29 ENCOUNTER — Ambulatory Visit
Admission: EM | Admit: 2022-04-29 | Discharge: 2022-04-29 | Disposition: A | Discharge status: Home / Self Care | Payer: 59 | Attending: Urgent Care | Admitting: Urgent Care

## 2022-04-29 ENCOUNTER — Ambulatory Visit (INDEPENDENT_AMBULATORY_CARE_PROVIDER_SITE_OTHER): Payer: 59

## 2022-04-29 ENCOUNTER — Ambulatory Visit: Payer: 59

## 2022-04-29 DIAGNOSIS — W19XXXA Unspecified fall, initial encounter: Secondary | ICD-10-CM

## 2022-04-29 DIAGNOSIS — M25512 Pain in left shoulder: Secondary | ICD-10-CM

## 2022-04-29 DIAGNOSIS — M542 Cervicalgia: Secondary | ICD-10-CM

## 2022-04-29 DIAGNOSIS — S39012A Strain of muscle, fascia and tendon of lower back, initial encounter: Secondary | ICD-10-CM

## 2022-04-29 DIAGNOSIS — M545 Low back pain, unspecified: Secondary | ICD-10-CM | POA: Diagnosis not present

## 2022-04-29 MED ORDER — CYCLOBENZAPRINE HCL 5 MG PO TABS: 5.0000 mg | ORAL_TABLET | Freq: Three times a day (TID) | ORAL | 0 refills | Status: AC | PRN

## 2022-04-29 MED ORDER — NAPROXEN 500 MG PO TABS: 500.0000 mg | ORAL_TABLET | Freq: Two times a day (BID) | ORAL | 0 refills | Status: DC

## 2022-04-29 NOTE — ED Provider Notes (Signed)
-[facility]  Note:  This document was prepared using Dragon voice recognition software and may include unintentional dictation errors.  MRN: [id number] DOB: 1980-10-03  Subjective:   Breanna [name] is a 41 y.o. female presenting for neck pain, upper back pain, low back pain.  She has also had shoulder pain.  Patient states that she strained her back about 3 days ago.  Then the next day she tripped over her dog and ended up landing toward the left side of her body causing her to hurt her shoulder, low back on the left side.  Has been using ibuprofen, IcyHot without any relief.  Would like to get x-rays done. No numbness or tingling, saddle paresthesia, changes to bowel or urinary habits, radicular symptoms.   No current facility-administered medications for this encounter.  Current Outpatient Medications:    acetaZOLAMIDE (DIAMOX) 500 MG capsule, Take 500 mg by mouth daily., Disp: , Rfl:    albuterol (PROVENTIL HFA;VENTOLIN HFA) 108 (90 Base) MCG/ACT inhaler, Inhale into the lungs every 6 (six) hours as needed for wheezing or shortness of breath., Disp: , Rfl:    anastrozole (ARIMIDEX) 1 MG tablet, Take 1 tablet (1 mg total) by mouth daily., Disp: 90 tablet, Rfl: 4   busPIRone (BUSPAR) 7.5 MG tablet, Take 7.5 mg by mouth daily. , Disp: , Rfl:    cetirizine (ZYRTEC) 10 MG tablet, Take 10 mg by mouth daily., Disp: , Rfl:    fluticasone (FLOVENT HFA) 220 MCG/ACT inhaler, , Disp: , Rfl:    gabapentin (NEURONTIN) 300 MG capsule, TAKE (1) CAPSULE BY MOUTH AT BEDTIME., Disp: 30 capsule, Rfl: 3   meloxicam (MOBIC) 15 MG tablet, Take 1 tablet (15 mg total) by mouth daily., Disp: 30 tablet, Rfl: 3   MOUNJARO 10 MG/0.5ML Pen, SMARTSIG:10 Milligram(s) SUB-Q Once a Week, Disp: , Rfl:    ondansetron (ZOFRAN) 4 MG tablet, Take 4 mg by mouth every 8 (eight) hours as needed., Disp: , Rfl:    pantoprazole (PROTONIX) 40 MG tablet, Take 40 mg by mouth daily., Disp: , Rfl:    Vitamin D,  Ergocalciferol, 50 MCG (2000 UT) CAPS, Take by mouth., Disp: 30 capsule, Rfl:    zonisamide (ZONEGRAN) 25 MG capsule, Take 50 mg by mouth daily. , Disp: , Rfl:    Allergies  Allergen Reactions   Adhesive [Tape] Other (See Comments)    BLISTERS   Latex Hives and Itching   Topamax [Topiramate] Other (See Comments)    ALTERED MENTAL STATUS   Celecoxib Palpitations   Codeine Itching and Rash   Penicillins Itching and Rash        Sulfa Antibiotics Itching and Rash    Past Medical History:  Diagnosis Date   Anxiety    Arthritis    knees   Asthma    Breast cancer ([place]) 05/2018   left    Cluster headaches    Cough 06/11/2018   Dental crown present    Family history of bladder cancer    Family history of kidney cancer    Family history of lung cancer    Family history of stomach cancer    GERD (gastroesophageal reflux disease)    Heart palpitations    occasional - no cardiologist   History of asthma    as a child - prn inhaler   History of radiation therapy 07/31/18- 09/17/18   Left Breast / 50.4 Gy in 28 fractions of 1.8 Gy, Left Supraclavicular / 50.4 Gy in 28 fractions of  1.8 Gy, and boost of 10 Gy in 5 fractions of 2 Gy each.    Obesity    Personal history of radiation therapy 07/2018   Pseudotumor cerebri syndrome    Seasonal allergies    Stuffy nose 06/11/2018     Past Surgical History:  Procedure Laterality Date   BIOPSY  11/08/2015   Procedure: BIOPSY;  Surgeon: Danie Binder, MD;  [facility];  Service: Endoscopy;;  Gastric bx and Gastric polyp bx   BREAST BIOPSY Left 05/07/2018   x2   BREAST LUMPECTOMY Left 06/24/2018   BREAST LUMPECTOMY WITH RADIOACTIVE SEED AND SENTINEL LYMPH NODE BIOPSY Left 06/24/2018   Procedure: LEFT BREAST LUMPECTOMY WITH  BRACKETED RADIOACTIVE SEED AND LEFT AXILLARY DEEP SENTINEL LYMPH NODE BIOPSY AND BLUE DYE INJECTION;  Surgeon: Fanny Skates, MD;  Location: [facility];  Service: General;  Laterality: Left;    CESAREAN SECTION  11/03/2003   CESAREAN SECTION  05/15/2006   CHOLECYSTECTOMY     COLONOSCOPY N/A 11/08/2015   Procedure: COLONOSCOPY;  Surgeon: Danie Binder, MD;  [facility];  Service: Endoscopy;  Laterality: N/A;  1000   ESOPHAGOGASTRODUODENOSCOPY N/A 11/08/2015   Procedure: ESOPHAGOGASTRODUODENOSCOPY (EGD);  Surgeon: Danie Binder, MD;  [facility];  Service: Endoscopy;  Laterality: N/A;   HEMORRHOID BANDING N/A 11/08/2015   Procedure: HEMORRHOID BANDING;  Surgeon: Danie Binder, MD;  [facility];  Service: Endoscopy;  Laterality: N/A;   SAVORY DILATION N/A 11/08/2015   Procedure: SAVORY DILATION;  Surgeon: Danie Binder, MD;  [facility];  Service: Endoscopy;  Laterality: N/A;   WISDOM TOOTH EXTRACTION      Family History  Problem Relation Age of Onset   COPD Mother    Pneumonia Mother    Hyperlipidemia Father    Kidney cancer Father 72       "simple renal cell carcinoma"   Heart disease Maternal Aunt    Hypertension Maternal Grandmother    Diabetes Maternal Grandmother    Stroke Paternal Grandmother    Hypertension Paternal Grandmother    Diabetes Paternal Grandmother    Stomach cancer Paternal Grandmother 28       had a portion of stomah removed   Diabetes Paternal Grandfather    Heart disease Paternal Grandfather    Hyperlipidemia Paternal Grandfather    Hypertension Paternal Grandfather    Bladder Cancer Maternal Uncle        hx smoking   Lung cancer Other     Social History   Tobacco Use   Smoking status: Never   Smokeless tobacco: Never  Vaping Use   Vaping Use: Never used  Substance Use Topics   Alcohol use: No   Drug use: No    ROS   Objective:   Vitals: BP 120/80 (BP Location: Right Arm)   Pulse 91   Temp 98.9 F (37.2 C) (Oral)   Resp 17   SpO2 96%   Physical Exam Constitutional:      General: She is not in acute distress.    Appearance: Normal appearance. She is well-developed. She is  obese. She is not ill-appearing, toxic-appearing or diaphoretic.  HENT:     Head: Normocephalic and atraumatic.     Nose: Nose normal.     Mouth/Throat:     Mouth: Mucous membranes are moist.  Eyes:     General: No scleral icterus.       Right eye: No discharge.  Left eye: No discharge.     Extraocular Movements: Extraocular movements intact.  Cardiovascular:     Rate and Rhythm: Normal rate.  Pulmonary:     Effort: Pulmonary effort is normal.  Musculoskeletal:     Left shoulder: Tenderness (anterior deltoid) present. No swelling, deformity, effusion, laceration, bony tenderness or crepitus. Normal range of motion. Normal strength.     Comments: Full range of motion throughout.  Strength 5/5 for upper and lower extremities.  Patient ambulates without any assistance at expected pace.  No ecchymosis, swelling, lacerations or abrasions.  Patient does have paraspinal muscle tenderness along the cervical and lumbar regions of her back excluding the midline worse over the left side.  Skin:    General: Skin is warm and dry.  Neurological:     General: No focal deficit present.     Mental Status: She is alert and oriented to person, place, and time.  Psychiatric:        Mood and Affect: Mood normal.        Behavior: Behavior normal.     DG Lumbar Spine 2-3 Views  Result Date: 04/29/2022 CLINICAL DATA:  Status post fall yesterday with low back pain. EXAM: LUMBAR SPINE - 2-3 VIEW COMPARISON:  None Available. FINDINGS: There is no evidence of lumbar spine fracture. Scoliosis of spine. Mild narrow L5-S1 intervertebral space noted. Prior cholecystectomy clips are noted. IMPRESSION: No acute fracture or dislocation noted. Mild degenerative joint changes at L5-S1. Electronically Signed   By: Abelardo Diesel M.D.   On: 04/29/2022 14:12   DG Cervical Spine 2-3 Views  Result Date: 04/29/2022 CLINICAL DATA:  Status post fall yesterday with pain. EXAM: CERVICAL SPINE - 2-3 VIEW COMPARISON:  None  Available. FINDINGS: There is no evidence of cervical spine fracture or prevertebral soft tissue swelling. Alignment is normal. No other significant bone abnormalities are identified. IMPRESSION: Negative cervical spine radiographs. Electronically Signed   By: Abelardo Diesel M.D.   On: 04/29/2022 14:12     Assessment and Plan :   PDMP not reviewed this encounter.  1. Lumbar strain, initial encounter   2. Neck pain   3. Acute pain of left shoulder   4. Accidental fall, initial encounter     Deferred imaging to the left shoulder as I have low suspicion for fracture.  Patient requested x-rays of her back which were negative apart from mild degenerative joint changes at L5-S1, I informed the patient on these results.  Will manage conservatively for back strain with NSAID and muscle relaxant, rest and modification of physical activity.  Anticipatory guidance provided.  Counseled patient on potential for adverse effects with medications prescribed/recommended today, ER and return-to-clinic precautions discussed, patient verbalized understanding.    Jaynee Eagles, PA-C 04/29/22 1458

## 2022-04-29 NOTE — ED Triage Notes (Signed)
Pt reports low back pain after she tripped over her daughter book  bag x 3 days; left shoulder pain since this morning after she feel down the stairs and fell on the grass. Icy hot gives no relief.

## 2022-06-19 ENCOUNTER — Encounter: Payer: Self-pay | Admitting: *Deleted

## 2022-06-19 ENCOUNTER — Inpatient Hospital Stay: Payer: 59 | Attending: Hematology and Oncology

## 2022-06-19 ENCOUNTER — Telehealth: Payer: Self-pay | Admitting: *Deleted

## 2022-06-19 ENCOUNTER — Other Ambulatory Visit: Payer: Self-pay

## 2022-06-19 VITALS — BP 130/84 | HR 71 | Temp 98.7°F | Resp 20

## 2022-06-19 DIAGNOSIS — C50812 Malignant neoplasm of overlapping sites of left female breast: Secondary | ICD-10-CM

## 2022-06-19 DIAGNOSIS — C50811 Malignant neoplasm of overlapping sites of right female breast: Secondary | ICD-10-CM | POA: Diagnosis present

## 2022-06-19 DIAGNOSIS — Z5111 Encounter for antineoplastic chemotherapy: Secondary | ICD-10-CM | POA: Insufficient documentation

## 2022-06-19 MED ORDER — GOSERELIN ACETATE 10.8 MG ~~LOC~~ IMPL
10.8000 mg | DRUG_IMPLANT | Freq: Once | SUBCUTANEOUS | Status: AC
  Administered 2022-06-19: 10.8 mg via SUBCUTANEOUS
  Filled 2022-06-19: qty 10.8

## 2022-06-19 NOTE — Telephone Encounter (Signed)
Patient presented to [facility]. She wanted to know if ok with Dr. Chryl Heck if she has a massage and if so, could Dr. Rob Hickman office provide a letter saying it's ok?  Per Dr. Chryl Heck, it is ok to have massage and to provide letter. Massage should avoid left chest/breast/axilla or any area that patient deems sensitive due to past treatments. Letter prepared and given to patient with Dr. Rob Hickman directions for patient to ask that massage therapist avoid sensitive areas. Patient accepted letter and verbalized understanding.

## 2022-06-23 ENCOUNTER — Ambulatory Visit
Admission: RE | Admit: 2022-06-23 | Discharge: 2022-06-23 | Disposition: A | Discharge status: Home / Self Care | Payer: 59 | Source: Ambulatory Visit | Attending: Hematology and Oncology | Admitting: Hematology and Oncology

## 2022-06-23 DIAGNOSIS — Z170 Estrogen receptor positive status [ER+]: Secondary | ICD-10-CM

## 2022-08-14 ENCOUNTER — Other Ambulatory Visit (HOSPITAL_COMMUNITY): Payer: Self-pay

## 2022-08-29 ENCOUNTER — Other Ambulatory Visit: Payer: Self-pay | Admitting: Hematology and Oncology

## 2022-09-18 ENCOUNTER — Inpatient Hospital Stay: Payer: 59 | Attending: Hematology and Oncology

## 2022-09-18 VITALS — BP 122/76 | HR 80 | Temp 98.4°F | Resp 20

## 2022-09-18 DIAGNOSIS — C50812 Malignant neoplasm of overlapping sites of left female breast: Secondary | ICD-10-CM

## 2022-09-18 DIAGNOSIS — Z5111 Encounter for antineoplastic chemotherapy: Secondary | ICD-10-CM | POA: Insufficient documentation

## 2022-09-18 DIAGNOSIS — C50811 Malignant neoplasm of overlapping sites of right female breast: Secondary | ICD-10-CM | POA: Insufficient documentation

## 2022-09-18 MED ORDER — GOSERELIN ACETATE 10.8 MG ~~LOC~~ IMPL
10.8000 mg | DRUG_IMPLANT | Freq: Once | SUBCUTANEOUS | Status: AC
  Administered 2022-09-18: 10.8 mg via SUBCUTANEOUS
  Filled 2022-09-18: qty 10.8

## 2022-11-24 ENCOUNTER — Inpatient Hospital Stay: Payer: 59 | Attending: Hematology and Oncology | Admitting: Hematology and Oncology

## 2022-11-24 VITALS — BP 139/85 | HR 75 | Temp 97.9°F | Resp 16 | Wt 252.5 lb

## 2022-11-24 DIAGNOSIS — Z170 Estrogen receptor positive status [ER+]: Secondary | ICD-10-CM | POA: Insufficient documentation

## 2022-11-24 DIAGNOSIS — J45909 Unspecified asthma, uncomplicated: Secondary | ICD-10-CM | POA: Diagnosis not present

## 2022-11-24 DIAGNOSIS — C50812 Malignant neoplasm of overlapping sites of left female breast: Secondary | ICD-10-CM | POA: Diagnosis not present

## 2022-11-24 DIAGNOSIS — Z79899 Other long term (current) drug therapy: Secondary | ICD-10-CM | POA: Diagnosis not present

## 2022-11-24 DIAGNOSIS — R232 Flushing: Secondary | ICD-10-CM | POA: Insufficient documentation

## 2022-11-24 DIAGNOSIS — Z79811 Long term (current) use of aromatase inhibitors: Secondary | ICD-10-CM | POA: Insufficient documentation

## 2022-11-24 DIAGNOSIS — K219 Gastro-esophageal reflux disease without esophagitis: Secondary | ICD-10-CM | POA: Insufficient documentation

## 2022-11-24 DIAGNOSIS — Z923 Personal history of irradiation: Secondary | ICD-10-CM | POA: Diagnosis not present

## 2022-11-24 DIAGNOSIS — C50811 Malignant neoplasm of overlapping sites of right female breast: Secondary | ICD-10-CM | POA: Insufficient documentation

## 2022-11-24 NOTE — Progress Notes (Signed)
[facility]  [phone] [phone]    ID: Breanna [name] DOB: [birth year]  MR#: [id number]  JYN#:[id number]  Patient Care Team: Samuella Bruin as PCP - General (Physician Assistant) West Bali, MD (Inactive) as Consulting Physician (Gastroenterology) Magrinat, Valentino Hue, MD (Inactive) as Consulting Physician (Oncology) Lonie Peak, MD as Attending Physician (Radiation Oncology) Claud Kelp, MD as Consulting Physician (General Surgery) Adline Potter, NP as Nurse Practitioner (Obstetrics and Gynecology) OTHER MD:  CHIEF COMPLAINT: Estrogen receptor positive multifocal breast cancer  CURRENT TREATMENT: goserelin/Zoladex; anastrozole   INTERVAL HISTORY:  Breanna [name] returns today for follow-up of her estrogen receptor positive multifocal breast cancer.  She is here with her husband.  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  She continues on ovarian suppression as well as anastrozole for antiestrogen therapy and has been tolerating it relatively well except for some vaginal dryness  and hot flashes. She also complains of some long standing headaches for almost 2 yrs wihtout a change in frequency or character. She is understandably worried about the breast cancer returning elsewhere.  She is also very worried about her ongoing weight gain.  She is working with her her provider and is now on Ozempic.  Previously she was on [facility] which worked well for her.  She lost about 50 pounds but gained about 25 pounds back.  She denies any breast changes per se.  No change in breathing, bowel habits or urinary habits.  HISTORY OF CURRENT ILLNESS: From the original intake note:  Breanna [name] noted a palpable abnormality in the 4 o'clock location of the LEFT breast sometime in September 2019.  She brought it up to medical attention and underwent bilateral diagnostic mammography with tomography and left breast  ultrasonography at [facility] on 04/23/2018 showing: a Suspicious mass in the 9:30 o'clock location of the LEFT breast 2 centimeters from the nipple; by ultrasound this was an irregular hypoechoic mass with spiculated margins and posterior acoustic shadowing which measures 0.5 x 0.7 x 1.1 centimeters. A second similar lesion is also suspected in the same portion of the breast but has not been completely evaluated. Recommend targeted ultrasound of the MEDIAL aspect of the LEFT breast at the time of patient's biopsy and possible second biopsy. LEFT axilla is negative for adenopathy. The areas of clinical concern in the LOWER OUTER QUADRANT of the LEFT breast in the UPPER INNER QUADRANT 10 centimeters from the nipple are negative by mammogram and ultrasound.  Accordingly on 05/07/2018 the patient proceeded to biopsy of the left breast areas in question. The pathology from this procedure showed ([id number]):  1. Breast, left, needle core biopsy, at 9:30 o'clock 3 cm from the nipple: invasive mammary carcinoma, grade II. Mammary carcinoma in situ.  2. Breast, left, needle core biopsy, 9:30 o'clock 2 cm from the nipple with invasive ductal carcinoma, grade I. Mammary carcinoma in situ.  E-cadherin stains are faint in the larger tumor but still positive, strong in the second tumor, and thus these are read as ductal.  Prognostic indicators significant for:  1. Estrogen receptor, 80% positive, moderate staining intensity and progesterone receptor, 80% positive, strong staining intensity. Proliferation marker Ki67 at 10%. HER2 negative by immunohistochemistry, 1+.   2. Estrogen Receptor: 90%, positive, strong staining intensity and progesterone Receptor: 90%, positive, strong staining intensity. Proliferation Marker Ki67: 10%. HER2 negative by immunohistochemistry, 1+.    The patient's subsequent history is as detailed below.   PAST MEDICAL  HISTORY: Past Medical History:  Diagnosis Date   Anxiety     Arthritis    knees   Asthma    Breast cancer (HCC) 05/2018   left    Cluster headaches    Cough 06/11/2018   Dental crown present    Family history of bladder cancer    Family history of kidney cancer    Family history of lung cancer    Family history of stomach cancer    GERD (gastroesophageal reflux disease)    Heart palpitations    occasional - no cardiologist   History of asthma    as a child - prn inhaler   History of radiation therapy 07/31/18- 09/17/18   Left Breast / 50.4 Gy in 28 fractions of 1.8 Gy, Left Supraclavicular / 50.4 Gy in 28 fractions of 1.8 Gy, and boost of 10 Gy in 5 fractions of 2 Gy each.    Obesity    Personal history of radiation therapy 07/2018   Pseudotumor cerebri syndrome    Seasonal allergies    Stuffy nose 06/11/2018    PAST SURGICAL HISTORY: Past Surgical History:  Procedure Laterality Date   BIOPSY  11/08/2015   Procedure: BIOPSY;  Surgeon: West Bali, MD;  Location: [facility];  Service: Endoscopy;;  Gastric bx and Gastric polyp bx   BREAST BIOPSY Left 05/07/2018   x2   BREAST LUMPECTOMY Left 06/24/2018   BREAST LUMPECTOMY WITH RADIOACTIVE SEED AND SENTINEL LYMPH NODE BIOPSY Left 06/24/2018   Procedure: LEFT BREAST LUMPECTOMY WITH  BRACKETED RADIOACTIVE SEED AND LEFT AXILLARY DEEP SENTINEL LYMPH NODE BIOPSY AND BLUE DYE INJECTION;  Surgeon: Claud Kelp, MD;  Location: [facility];  Service: General;  Laterality: Left;   CESAREAN SECTION  11/03/2003   CESAREAN SECTION  05/15/2006   CHOLECYSTECTOMY     COLONOSCOPY N/A 11/08/2015   Procedure: COLONOSCOPY;  Surgeon: West Bali, MD;  Location: [facility];  Service: Endoscopy;  Laterality: N/A;  1000   ESOPHAGOGASTRODUODENOSCOPY N/A 11/08/2015   Procedure: ESOPHAGOGASTRODUODENOSCOPY (EGD);  Surgeon: West Bali, MD;  Location: [facility];  Service: Endoscopy;  Laterality: N/A;   HEMORRHOID BANDING N/A 11/08/2015   Procedure: HEMORRHOID BANDING;  Surgeon: West Bali, MD;  [facility]: [facility];  Service: Endoscopy;  Laterality: N/A;   SAVORY DILATION N/A 11/08/2015   Procedure: SAVORY DILATION;  Surgeon: West Bali, MD;  Location: [facility];  Service: Endoscopy;  Laterality: N/A;   WISDOM TOOTH EXTRACTION      FAMILY HISTORY: Family History  Problem Relation Age of Onset   COPD Mother    Pneumonia Mother    Hyperlipidemia Father    Kidney cancer Father 67       "simple renal cell carcinoma"   Heart disease Maternal Aunt    Bladder Cancer Maternal Uncle        hx smoking   Hypertension Maternal Grandmother    Diabetes Maternal Grandmother    Stroke Paternal Grandmother    Hypertension Paternal Grandmother    Diabetes Paternal Grandmother    Stomach cancer Paternal Grandmother 90       had a portion of stomah removed   Diabetes Paternal Grandfather    Heart disease Paternal Grandfather    Hyperlipidemia Paternal Grandfather    Hypertension Paternal Grandfather    Lung cancer Other    Breast cancer Neg Hx   As of November 2019 her father is 52 years old. Patients' mother died  from pneumonia at age 71. The patient has 0 brothers and 0 sisters. Patient denies any family story of ovarian or breast cancer. Her paternal grandmother had gastric cancer in 2000. Her paternal aunt had lung cancer without a hx of smoking. Her maternal uncle has had bladder cancer.    GYNECOLOGIC HISTORY:  No LMP recorded. (Menstrual status: Chemotherapy). Menarche: 42 years old Age at first live birth: 42 years old GX P2 LMP: No Contraceptive: Implanon in place.  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 Her husband Breanna [name] works for the [employer] as an Designer, television/film set. Her two children are a son 21 who hopes to be a Comptroller and daughter 37 years old.  The patient attends full [employer] in [place].   ADVANCED DIRECTIVES: In the absence of  any documents to the contrary the patient's husband is her healthcare power of attorney   HEALTH MAINTENANCE: Social History   Tobacco Use   Smoking status: Never   Smokeless tobacco: Never  Vaping Use   Vaping Use: Never used  Substance Use Topics   Alcohol use: No   Drug use: No     Colonoscopy: 2017  PAP: 2017  Bone density: No   Allergies  Allergen Reactions   Adhesive [Tape] Other (See Comments)    BLISTERS   Latex Hives and Itching   Topamax [Topiramate] Other (See Comments)    ALTERED MENTAL STATUS   Celecoxib Palpitations   Codeine Itching and Rash   Penicillins Itching and Rash        Sulfa Antibiotics Itching and Rash    Current Outpatient Medications  Medication Sig Dispense Refill   acetaZOLAMIDE (DIAMOX) 500 MG capsule Take 500 mg by mouth daily.     albuterol (PROVENTIL HFA;VENTOLIN HFA) 108 (90 Base) MCG/ACT inhaler Inhale into the lungs every 6 (six) hours as needed for wheezing or shortness of breath.     anastrozole (ARIMIDEX) 1 MG tablet Take 1 tablet (1 mg total) by mouth daily. 90 tablet 4   busPIRone (BUSPAR) 7.5 MG tablet Take 7.5 mg by mouth daily.      cetirizine (ZYRTEC) 10 MG tablet Take 10 mg by mouth daily.     cyclobenzaprine (FLEXERIL) 5 MG tablet Take 1 tablet (5 mg total) by mouth 3 (three) times daily as needed for muscle spasms. 30 tablet 0   fluticasone (FLOVENT HFA) 220 MCG/ACT inhaler      gabapentin (NEURONTIN) 300 MG capsule TAKE 1 CAPSULE BY MOUTH AT  BEDTIME 60 capsule 5   MOUNJARO 10 MG/0.5ML Pen SMARTSIG:10 Milligram(s) SUB-Q Once a Week     naproxen (NAPROSYN) 500 MG tablet Take 1 tablet (500 mg total) by mouth 2 (two) times daily with a meal. 30 tablet 0   ondansetron (ZOFRAN) 4 MG tablet Take 4 mg by mouth every 8 (eight) hours as needed.     pantoprazole (PROTONIX) 40 MG tablet Take 40 mg by mouth daily.     Vitamin D, Ergocalciferol, 50 MCG (2000 UT) CAPS Take by mouth. 30 capsule    zonisamide (ZONEGRAN) 25 MG capsule  Take 50 mg by mouth daily.      No current facility-administered medications for this visit.    OBJECTIVE: white woman who looks younger than stated age  Vitals:   11/24/22 1352  BP: 139/85  Pulse: 75  Resp: 16  Temp: 97.9 F (  36.6 C)  SpO2: 100%     Body mass index is 42.02 kg/m.   Wt Readings from Last 3 Encounters:  11/24/22 252 lb 8 oz (114.5 kg)  11/21/21 238 lb 6.4 oz (108.1 kg)  08/04/21 247 lb 3.2 oz (112.1 kg)      ECOG FS:1 - Symptomatic but completely ambulatory  Physical Exam Constitutional:      Appearance: Normal appearance.  HENT:     Head: Normocephalic and atraumatic.  Cardiovascular:     Rate and Rhythm: Normal rate and regular rhythm.  Pulmonary:     Effort: Pulmonary effort is normal.     Breath sounds: Normal breath sounds.  Chest:     Comments: Bilateral breasts inspected.  Postsurgical changes noted, no other masses or concerning regional adenopathy Musculoskeletal:        General: Normal range of motion.     Cervical back: Normal range of motion and neck supple. No rigidity.  Skin:    General: Skin is warm and dry.  Neurological:     General: No focal deficit present.     Mental Status: She is alert.     LAB RESULTS:  CMP     Component Value Date/Time   NA 140 11/21/2021 1259   K 3.9 11/21/2021 1259   CL 109 11/21/2021 1259   CO2 23 11/21/2021 1259   GLUCOSE 109 (H) 11/21/2021 1259   BUN 10 11/21/2021 1259   CREATININE 1.13 (H) 11/21/2021 1259   CREATININE 1.08 08/22/2013 1039   CALCIUM 9.4 11/21/2021 1259   PROT 7.5 11/21/2021 1259   ALBUMIN 4.2 11/21/2021 1259   AST 20 11/21/2021 1259   ALT 19 11/21/2021 1259   ALKPHOS 57 11/21/2021 1259   BILITOT 0.7 11/21/2021 1259   GFRNONAA >60 11/21/2021 1259   GFRAA 57 (L) 12/18/2019 1410   GFRAA >60 05/22/2018 1158    No results found for: "TOTALPROTELP", "ALBUMINELP", "A1GS", "A2GS", "BETS", "BETA2SER", "GAMS", "MSPIKE", "SPEI"  No results found for: "KPAFRELGTCHN",  "LAMBDASER", "KAPLAMBRATIO"  Lab Results  Component Value Date   WBC 7.1 11/21/2021   NEUTROABS 4.4 11/21/2021   HGB 15.2 (H) 11/21/2021   HCT 44.0 11/21/2021   MCV 91.5 11/21/2021   PLT 230 11/21/2021   No results found for: "LABCA2"  No components found for: "ZOXWRU045"  No results for input(s): "INR" in the last 168 hours.  No results found for: "LABCA2"  No results found for: "WUJ811"  No results found for: "CAN125"  No results found for: "CAN153"  No results found for: "CA2729"  No components found for: "HGQUANT"  No results found for: "CEA1", "CEA" / No results found for: "CEA1", "CEA"   No results found for: "AFPTUMOR"  No results found for: "CHROMOGRNA"  No results found for: "HGBA", "HGBA2QUANT", "HGBFQUANT", "HGBSQUAN" (Hemoglobinopathy evaluation)   No results found for: "LDH"  No results found for: "IRON", "TIBC", "IRONPCTSAT" (Iron and TIBC)  No results found for: "FERRITIN"  Urinalysis No results found for: "COLORURINE", "APPEARANCEUR", "LABSPEC", "PHURINE", "GLUCOSEU", "HGBUR", "BILIRUBINUR", "KETONESUR", "PROTEINUR", "UROBILINOGEN", "NITRITE", "LEUKOCYTESUR"   STUDIES:  No results found.   ELIGIBLE FOR AVAILABLE RESEARCH PROTOCOL: no   ASSESSMENT: 42 y.o. [facility] woman, status post left breast biopsy x2 on 05/07/2018, showing (a) left breast upper inner quadrant 2 cm from the nipple: a clinical T1b N0, stage IA invasive ductal carcinoma, grade 1, estrogen and progesterone receptor strongly positive, HER-2 not amplified, with an MIB-1 of 10%; strongly E-cadherin positive (b) left breast upper  inner quadrant 3 cm from the nipple, a clinical T1c N0, stage IA invasive ductal carcinoma, grade 2, estrogen receptor positive with moderate staining intensity, progesterone receptor positive with strong staining intensity, with an MIB-1 of 10%, and no HER-2 amplification; faint but positive E-cadherin stain  (1) genetics testing  05/28/2018 through the Multi-Cancer Panel offered by [facility] showed no deleterious mutations in AIP, ALK, APC, ATM, AXIN2, BAP1, BARD1, BLM, BMPR1A, BRCA1, BRCA2, BRIP1, BUB1B, CASR, CDC73, CDH1, CDK4, CDKN1B, CDKN1C, CDKN2A, CEBPA, CEP57, CHEK2, CTNNA1, DICER1, DIS3L2, EGFR, ENG, EPCAM, FH, FLCN, GALNT12, GATA2, GPC3, GREM1, HOXB13, HRAS, KIT, MAX, MEN1, MET, MITF, MLH1, MLH3, MSH2, MSH3, MSH6, MUTYH, NBN, NF1, NF2, NTHL1, PALB2, PDGFRA, PHOX2B, PMS2, POLD1, POLE, POT1, PRKAR1A, PTCH1, PTEN, RAD50, RAD51C, RAD51D, RB1, RECQL4, RET, RNF43, RPS20, RUNX1, SDHA, SDHAF2, SDHB, SDHC, SDHD, SMAD4, SMARCA4, SMARCB1, SMARCE1, STK11, SUFU, TERC, TERT, TMEM127, TP53, TSC1, TSC2, VHL, WRN, WT1  (2) status post left lumpectomy and sentinel lymph node sampling 06/24/2018 for a pT2 pN1, stage IIA invasive ductal carcinoma, grade 1, with negative margins  (a) a total of 2 sentinel lymph nodes were removed  (3) MammaPrint read as low risk, with an average 10-year risk of recurrence with no treatment of 10%, the 5-year disease-free survival being 97.8% with hormone treatment alone  (4) adjuvant radiation 07/31/2018 - 09/17/2018  (a) Left Breast and IM nodes/ 50.4 Gy in 28 fractions of 1.8 Gy  (b) Left post axilla/ supraclavicular / 50.4 Gy in 28 fractions of 1.8 Gy  (c) Boost / 10 Gy in 5 fractions of 2 Gy  (5) anastrozole started 09/19/2018  (a) goserelin started 09/25/2018, initially monthly  (b) goserelin changed to every 3 months beginning 12/18/2019   PLAN: Ms. Breanna is currently tolerating combination of ovarian suppression and antiestrogen therapy well except for some vaginal dryness and hot flashes which are minor.  She reports no changes in her breast.  Physical examination today once again with no concerns.  Last mammogram December 2023 with no evidence of malignancy.  We have encouraged her to continue current therapy for at least 5 years.  We have discussed about role of extended antiestrogen therapy  in some women especially when women have large tumors with multiple positive lymph nodes at diagnosis.   I think it is reasonable to consider extended antiestrogen therapy given lymph node positive disease and young age at the time of diagnosis.  She is willing to continue Zoladex every 3 months with anastrozole for the next 5 years.  Most recent bone density was normal.  No concerns on exam today.  Most recent mammogram was unremarkable.  I do not believe her longstanding headaches are related to breast cancer.  I have however asked her to report to [place] if there is any change in the frequency or character of the headaches and she expressed understanding. She should otherwise return to clinic in 1 year.

## 2022-12-01 ENCOUNTER — Other Ambulatory Visit: Payer: Self-pay

## 2022-12-01 ENCOUNTER — Encounter (HOSPITAL_COMMUNITY): Payer: Self-pay | Admitting: Emergency Medicine

## 2022-12-01 ENCOUNTER — Emergency Department (HOSPITAL_COMMUNITY): Payer: 59

## 2022-12-01 ENCOUNTER — Emergency Department (HOSPITAL_COMMUNITY)
Admission: EM | Admit: 2022-12-01 | Discharge: 2022-12-02 | Disposition: A | Discharge status: Home / Self Care | Payer: 59 | Attending: Emergency Medicine | Admitting: Emergency Medicine

## 2022-12-01 DIAGNOSIS — R0789 Other chest pain: Secondary | ICD-10-CM | POA: Insufficient documentation

## 2022-12-01 DIAGNOSIS — S46911A Strain of unspecified muscle, fascia and tendon at shoulder and upper arm level, right arm, initial encounter: Secondary | ICD-10-CM | POA: Insufficient documentation

## 2022-12-01 DIAGNOSIS — M25511 Pain in right shoulder: Secondary | ICD-10-CM | POA: Diagnosis present

## 2022-12-01 DIAGNOSIS — Z91040 Latex allergy status: Secondary | ICD-10-CM | POA: Diagnosis not present

## 2022-12-01 DIAGNOSIS — Z853 Personal history of malignant neoplasm of breast: Secondary | ICD-10-CM | POA: Insufficient documentation

## 2022-12-01 DIAGNOSIS — S161XXA Strain of muscle, fascia and tendon at neck level, initial encounter: Secondary | ICD-10-CM | POA: Diagnosis not present

## 2022-12-01 DIAGNOSIS — Z7951 Long term (current) use of inhaled steroids: Secondary | ICD-10-CM | POA: Insufficient documentation

## 2022-12-01 DIAGNOSIS — X58XXXA Exposure to other specified factors, initial encounter: Secondary | ICD-10-CM | POA: Insufficient documentation

## 2022-12-01 DIAGNOSIS — J45909 Unspecified asthma, uncomplicated: Secondary | ICD-10-CM | POA: Diagnosis not present

## 2022-12-01 LAB — BASIC METABOLIC PANEL
Anion gap: 8 (ref 5–15)
BUN: 13 mg/dL (ref 6–20)
CO2: 25 mmol/L (ref 22–32)
Calcium: 9.3 mg/dL (ref 8.9–10.3)
Chloride: 106 mmol/L (ref 98–111)
Creatinine, Ser: 1.2 mg/dL — ABNORMAL HIGH (ref 0.44–1.00)
GFR, Estimated: 58 mL/min — ABNORMAL LOW (ref >60)
Glucose, Bld: 105 mg/dL — ABNORMAL HIGH (ref 70–99)
Potassium: 3.9 mmol/L (ref 3.5–5.1)
Sodium: 139 mmol/L (ref 135–145)

## 2022-12-01 LAB — HCG, QUANTITATIVE, PREGNANCY: hCG, Beta Chain, Quant, S: <1 m[IU]/mL (ref <5)

## 2022-12-01 LAB — CBC
HCT: 42.3 % (ref 36.0–46.0)
Hemoglobin: 14.5 g/dL (ref 12.0–15.0)
MCH: 32 pg (ref 26.0–34.0)
MCHC: 34.3 g/dL (ref 30.0–36.0)
MCV: 93.4 fL (ref 80.0–100.0)
Platelets: 235 10*3/uL (ref 150–400)
RBC: 4.53 MIL/uL (ref 3.87–5.11)
RDW: 12.3 % (ref 11.5–15.5)
WBC: 7.6 10*3/uL (ref 4.0–10.5)
nRBC: 0 % (ref 0.0–0.2)

## 2022-12-01 LAB — TROPONIN I (HIGH SENSITIVITY)
Troponin I (High Sensitivity): <2 ng/L (ref <18)
Troponin I (High Sensitivity): <2 ng/L (ref <18)

## 2022-12-01 LAB — LIPASE, BLOOD: Lipase: 43 U/L (ref 11–51)

## 2022-12-01 LAB — D-DIMER, QUANTITATIVE: D-Dimer, Quant: 0.59 ug/mL-FEU — ABNORMAL HIGH (ref 0.00–0.50)

## 2022-12-01 LAB — HEPATIC FUNCTION PANEL
ALT: 27 U/L (ref 0–44)
AST: 22 U/L (ref 15–41)
Albumin: 3.8 g/dL (ref 3.5–5.0)
Alkaline Phosphatase: 60 U/L (ref 38–126)
Bilirubin, Direct: 0.1 mg/dL (ref 0.0–0.2)
Indirect Bilirubin: 0.7 mg/dL (ref 0.3–0.9)
Total Bilirubin: 0.8 mg/dL (ref 0.3–1.2)
Total Protein: 7 g/dL (ref 6.5–8.1)

## 2022-12-01 MED ORDER — KETOROLAC TROMETHAMINE 30 MG/ML IJ SOLN
15.0000 mg | Freq: Once | INTRAMUSCULAR | Status: AC
  Administered 2022-12-01: 15 mg via INTRAVENOUS
  Filled 2022-12-01: qty 1

## 2022-12-01 MED ORDER — ALUM & MAG HYDROXIDE-SIMETH 200-200-20 MG/5ML PO SUSP
30.0000 mL | Freq: Once | ORAL | Status: AC
  Administered 2022-12-01: 30 mL via ORAL
  Filled 2022-12-01: qty 30

## 2022-12-01 MED ORDER — MAALOX MAX 400-400-40 MG/5ML PO SUSP: 10.0000 mL | Freq: Four times a day (QID) | ORAL | 0 refills | Status: AC | PRN

## 2022-12-01 MED ORDER — FAMOTIDINE 20 MG PO TABS: 20.0000 mg | ORAL_TABLET | Freq: Two times a day (BID) | ORAL | 0 refills | Status: AC

## 2022-12-01 MED ORDER — LIDOCAINE 5 % EX PTCH
1.0000 | MEDICATED_PATCH | CUTANEOUS | Status: DC
  Administered 2022-12-01: 1 via TRANSDERMAL
  Filled 2022-12-01: qty 1

## 2022-12-01 MED ORDER — CYCLOBENZAPRINE HCL 10 MG PO TABS: 10.0000 mg | ORAL_TABLET | Freq: Two times a day (BID) | ORAL | 0 refills | Status: AC | PRN

## 2022-12-01 MED ORDER — METHOCARBAMOL 1000 MG/10ML IJ SOLN
INTRAMUSCULAR | Status: AC
  Filled 2022-12-01: qty 10

## 2022-12-01 MED ORDER — KETOROLAC TROMETHAMINE 30 MG/ML IJ SOLN: 30.0000 mg | Freq: Once | INTRAMUSCULAR | Status: DC

## 2022-12-01 MED ORDER — IOHEXOL 350 MG/ML SOLN
75.0000 mL | Freq: Once | INTRAVENOUS | Status: AC | PRN
  Administered 2022-12-01: 75 mL via INTRAVENOUS

## 2022-12-01 MED ORDER — METHOCARBAMOL 1000 MG/10ML IJ SOLN
500.0000 mg | Freq: Once | INTRAVENOUS | Status: AC
  Administered 2022-12-01: 500 mg via INTRAVENOUS
  Filled 2022-12-01: qty 5

## 2022-12-01 NOTE — ED Provider Notes (Signed)
[place] EMERGENCY DEPARTMENT AT [facility] Provider Note   CSN: [id number] Arrival date & time: 12/01/22  1912     History  Chief Complaint  Patient presents with   Chest Pain   Shoulder Pain    Breanna [name] is a 42 y.o. female.  With PMH of GERD, breast cancer, asthma presenting with atraumatic right neck, right posterior shoulder and substernal burning chest pain Since this morning.  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  She does note that it could be anxiety as she carries her anxiety and stress in her shoulders and back.  She took Tylenol, Aleve and over-the-counter medications for reflux without relief so she wanted to be checked out to make sure she was not having a heart attack or something else.  She has no history of PE or DVT.  She has had no new leg pain or swelling.  She is status postlumpectomy and radiation treatments for breast cancer.  She has had no fevers, no chills, no cough or other infectious symptoms.  Pain is reproducible of her back although no numbness tingling or difficulty moving right upper extremity.  Denies any abdominal pain or vomiting.  Chest Pain Shoulder Pain      Home Medications Prior to Admission medications   Medication Sig Start Date End Date Taking? Authorizing Provider  alum & mag hydroxide-simeth (MAALOX MAX) 400-400-40 MG/5ML suspension Take 10 mLs by mouth every 6 (six) hours as needed for indigestion. 12/01/22  Yes Mardene Sayer, MD  cyclobenzaprine (FLEXERIL) 10 MG tablet Take 1 tablet (10 mg total) by mouth 2 (two) times daily as needed for muscle spasms. 12/01/22  Yes Mardene Sayer, MD  famotidine (PEPCID) 20 MG tablet Take 1 tablet (20 mg total) by mouth 2 (two) times daily. 12/01/22  Yes Mardene Sayer, MD  acetaZOLAMIDE (DIAMOX) 500 MG capsule Take 500 mg by mouth daily.    [provider]   albuterol (PROVENTIL HFA;VENTOLIN HFA) 108 (90 Base) MCG/ACT inhaler Inhale into the lungs every 6 (six) hours as needed for wheezing or shortness of breath.    [provider]  anastrozole (ARIMIDEX) 1 MG tablet Take 1 tablet (1 mg total) by mouth daily. 12/23/21   Rachel Moulds, MD  busPIRone (BUSPAR) 7.5 MG tablet Take 7.5 mg by mouth daily.  05/20/18   [provider]  cetirizine (ZYRTEC) 10 MG tablet Take 10 mg by mouth daily.    [provider]  cyclobenzaprine (FLEXERIL) 5 MG tablet Take 1 tablet (5 mg total) by mouth 3 (three) times daily as needed for muscle spasms. 04/29/22   Wallis Bamberg, PA-C  fluticasone (FLOVENT HFA) 220 MCG/ACT inhaler  12/06/18   [provider]  gabapentin (NEURONTIN) 300 MG capsule TAKE 1 CAPSULE BY MOUTH AT  BEDTIME 08/30/22   Rachel Moulds, MD  [facility] 10 MG/0.5ML Pen SMARTSIG:10 Milligram(s) SUB-Q Once a Week 07/05/21   [provider]  naproxen (NAPROSYN) 500 MG tablet Take 1 tablet (500 mg total) by mouth 2 (two) times daily with a meal. 04/29/22   Wallis Bamberg, PA-C  ondansetron (ZOFRAN) 4 MG tablet Take 4 mg by mouth every 8 (eight) hours as needed. 07/05/21   [provider]  pantoprazole (PROTONIX) 40 MG tablet Take 40 mg by mouth daily.    [provider]  Vitamin D, Ergocalciferol, 50 MCG (2000 UT)  CAPS Take by mouth. 07/03/19   Magrinat, Valentino Hue, MD  zonisamide (ZONEGRAN) 25 MG capsule Take 50 mg by mouth daily.  03/13/18   [provider]      Allergies    Adhesive [tape], Latex, Topamax [topiramate], Celecoxib, Codeine, Penicillins, and Sulfa antibiotics    Review of Systems   Review of Systems  Cardiovascular:  Positive for chest pain.    Physical Exam Updated Vital Signs BP 105/79   Pulse 71   Temp 98.2 F (36.8 C) (Oral)   Resp 16   Ht 5\' 5"  (1.651 m)   Wt 112.5 kg   LMP 08/24/2018 (Exact Date)   SpO2 100%   BMI 41.27 kg/m  Physical Exam Constitutional:  Alert and oriented.  Slightly anxious but nontoxic no acute distress well-appearing Eyes: Conjunctivae are normal. ENT      Head: Normocephalic and atraumatic. Cardiovascular: S1, S2,  Normal and symmetric distal pulses are present in all extremities.Warm and well perfused. Respiratory: Normal respiratory effort. Breath sounds are normal.  O2 sat 100 on RA Gastrointestinal: Soft and nontender.  Musculoskeletal: Normal range of motion in all extremities.  Reproducible tenderness overlying the right cervical paraspinal region as well as right upper trapezius.  There is no external evidence of injury in these areas, no ecchymoses or edema or laceration.  Full range of motion of right upper extremity intact with palpable pulses.  Full strength intact of right upper extremity.      Right lower leg: No tenderness or edema.      Left lower leg: No tenderness or edema. Neurologic: Normal speech and language.  Moving all extremities equally.  Sensation grossly intact.  No facial droop.  No gross focal neurologic deficits are appreciated. Skin: Skin is warm, dry and intact. No rash noted. Psychiatric: Mood and affect are normal. Speech and behavior are normal.  ED Results / Procedures / Treatments   Labs (all labs ordered are listed, but only abnormal results are displayed) Labs Reviewed  BASIC METABOLIC PANEL - Abnormal; Notable for the following components:      Result Value   Glucose, Bld 105 (*)    Creatinine, Ser 1.20 (*)    GFR, Estimated 58 (*)    All other components within normal limits  D-DIMER, QUANTITATIVE - Abnormal; Notable for the following components:   D-Dimer, Quant 0.59 (*)    All other components within normal limits  CBC  HEPATIC FUNCTION PANEL  LIPASE, BLOOD  HCG, QUANTITATIVE, PREGNANCY  TROPONIN I (HIGH SENSITIVITY)  TROPONIN I (HIGH SENSITIVITY)    EKG EKG Interpretation  Date/Time:  Friday Dec 01 2022 19:32:04 EDT Ventricular Rate:  85 PR Interval:  156 QRS  Duration: 99 QT Interval:  367 QTC Calculation: 437 R Axis:   123 Text Interpretation: Sinus rhythm Right axis deviation Low voltage, precordial leads No significant change since last tracing Confirmed by Vivien Rossetti ([id number]) on 12/01/2022 7:41:39 PM  Radiology CT Angio Chest PE W and/or Wo Contrast  Result Date: 12/01/2022 CLINICAL DATA:  Right back and chest pain with positive D-dimer. History of breast cancer. EXAM: CT ANGIOGRAPHY CHEST WITH CONTRAST TECHNIQUE: Multidetector CT imaging of the chest was performed using the standard protocol during bolus administration of intravenous contrast. Multiplanar CT image reconstructions and MIPs were obtained to evaluate the vascular anatomy. RADIATION DOSE REDUCTION: This exam was performed according to the departmental dose-optimization program which includes automated exposure control, adjustment of the mA and/or kV according to patient size and/or use  of iterative reconstruction technique. CONTRAST:  75mL OMNIPAQUE IOHEXOL 350 MG/ML SOLN COMPARISON:  Chest radiographs 12/01/2022 FINDINGS: Cardiovascular: Satisfactory opacification of the pulmonary arteries to the segmental level. No pulmonary embolism. Normal heart size. No pericardial effusion. No aortic aneurysm or dissection. Mediastinum/Nodes: Unremarkable esophagus.  No thoracic adenopathy. Lungs/Pleura: Centrilobular micro nodules in the left upper lobe (circa series 6/image 53). Otherwise no focal consolidation. No pleural effusion or pneumothorax. Central airways are patent. Upper Abdomen: Cholecystectomy.  No acute abnormality. Musculoskeletal: No acute fracture or destructive osseous lesion. Surgical clips left breast and axilla. Review of the MIP images confirms the above findings. IMPRESSION: 1. No evidence of pulmonary embolism. 2. Centrilobular micro nodules in the left upper lobe most likely due to small airway infection/inflammation. Given history of cancer, follow-up in 6-8 weeks is  recommended to ensure resolution Electronically Signed   By: Minerva Fester M.D.   On: 12/01/2022 23:32   DG Chest 2 View  Result Date: 12/01/2022 CLINICAL DATA:  Chest pain. EXAM: CHEST - 2 VIEW COMPARISON:  February 25, 2019 FINDINGS: The heart size and mediastinal contours are within normal limits. Both lungs are clear. Radiopaque surgical clips are seen overlying the lateral aspect of the upper left hemithorax and soft tissues of the left breast. The visualized skeletal structures are unremarkable. IMPRESSION: No active cardiopulmonary disease. Electronically Signed   By: Aram Candela M.D.   On: 12/01/2022 19:52    Procedures Procedures    Medications Ordered in ED Medications  lidocaine (LIDODERM) 5 % 1 patch (1 patch Transdermal Patch Applied 12/01/22 2058)  methocarbamol (ROBAXIN) 500 mg in dextrose 5 % 50 mL IVPB (0 mg Intravenous Stopped 12/01/22 2333)  alum & mag hydroxide-simeth (MAALOX/MYLANTA) 200-200-20 MG/5ML suspension 30 mL (30 mLs Oral Given 12/01/22 2054)  ketorolac (TORADOL) 30 MG/ML injection 15 mg (15 mg Intravenous Given 12/01/22 2054)  iohexol (OMNIPAQUE) 350 MG/ML injection 75 mL (75 mLs Intravenous Contrast Given 12/01/22 2307)    ED Course/ Medical Decision Making/ A&P                             Medical Decision Making Adlean Newhard [place] is a 42 y.o. female.  With PMH of GERD, breast cancer, asthma presenting with atraumatic right neck, right posterior shoulder and substernal burning chest pain Since this morning.   Regarding patient's right shoulder and back pain, she has reproducible pain overlying the right cervical paraspinal region and trapezius, suspect likely nonspecific MSK pain such as cervical radiculopathy or muscle spasm.  Patient is neurovascularly intact, no concern for ischemic limb or arterial injury.  Not concern for spinal cord injury.  No concern for fracture or injury due to no recent reported falls or injuries.  Was provided Toradol,  Robaxin and Lidoderm patch with relief.  Additionally, chest x-ray obtained which I personally reviewed no evidence of fracture, no evidence of pneumonia, no evidence of pleural effusion or pneumothorax.  Regarding the patient's chest pain, this is atypical and burning in nature likely reflux or GERD.  She has no abdominal pain on exam with normal lipase and no transaminitis, not concern for biliary pathology or pancreatitis.  Her HEART score is 2, low risk, and overall have an EKG which is reassuring and unchanged from previous. Will further evaluate for ACS with high-sensitivity troponin at least 3 hours from the patient's start of pain which was undetected x 2.  Chest x-ray obtained which I personally reviewed which showed  no evidence for pneumonia, pneumothorax, and pulmonary edema.  I have low suspicion for PE with no hypoxia or increased work of breathing or signs of DVT however with history of cancer, D-dimer sent which was elevated at 0.59 thus obtain CTA PE study which showed no evidence of PE.  There were some micronodules and inflammatory changes in the left upper lobe.  I do not think she has pneumonia with no symptoms suggestive.  I discussed follow-up with PCP for repeat imaging in the future regarding these findings.  Patient had improvement of pain with medications provided today.  Discharged in good condition.   Amount and/or Complexity of Data Reviewed Labs: ordered. Radiology: ordered.  Risk OTC drugs. Prescription drug management.   Final Clinical Impression(s) / ED Diagnoses Final diagnoses:  Atypical chest pain  Strain of right shoulder, initial encounter  Strain of neck muscle, initial encounter    Rx / DC Orders ED Discharge Orders          Ordered    famotidine (PEPCID) 20 MG tablet  2 times daily        12/01/22 2335    alum & mag hydroxide-simeth (MAALOX MAX) 400-400-40 MG/5ML suspension  Every 6 hours PRN        12/01/22 2335    cyclobenzaprine (FLEXERIL) 10  MG tablet  2 times daily PRN        12/01/22 2341              Mardene Sayer, MD 12/01/22 2343

## 2022-12-01 NOTE — Discharge Instructions (Addendum)
You were seen in the emergency department today for chest pain. You workup did not reveal a definite cause of your symptoms but was generally reassuring.  As we discussed, I do suspect a component of reflux for your burning chest pain as well as musculoskeletal strain of your shoulder and neck causing right shoulder and neck pain.  Your CT scan showed no evidence of blood clot.  However as we discussed, there was some inflammatory changes in your left upper lung that we will need follow-up imaging with your oncologist or primary care doctor to ensure resolution.  I do not suspect this is infection or pneumonia at this time.  Return to the emergency department immediately if you develop recurrent, severe chest pain, shortness of breath, fainting spells, sudden sweatiness, or any other concerning symptoms.   Please also make an appointment to follow up with your primary care doctor or cardiologist within one week to assure improvement or resolution in symptoms. Further testing may be necessary, so it is extremely important to keep your follow-up appointment with your primary doctor.

## 2022-12-01 NOTE — ED Triage Notes (Signed)
Pt with c/o epigastric pain, Rt neck and shoulder pain that radiates down rt arm. Pt has taken multiple OTC meds without relief.

## 2022-12-18 ENCOUNTER — Other Ambulatory Visit: Payer: Self-pay

## 2022-12-18 ENCOUNTER — Inpatient Hospital Stay: Payer: 59

## 2022-12-18 ENCOUNTER — Inpatient Hospital Stay: Payer: 59 | Attending: Hematology and Oncology

## 2022-12-18 VITALS — BP 126/91 | HR 81 | Temp 98.9°F | Resp 16

## 2022-12-18 DIAGNOSIS — C50811 Malignant neoplasm of overlapping sites of right female breast: Secondary | ICD-10-CM | POA: Insufficient documentation

## 2022-12-18 DIAGNOSIS — C50812 Malignant neoplasm of overlapping sites of left female breast: Secondary | ICD-10-CM

## 2022-12-18 DIAGNOSIS — Z5111 Encounter for antineoplastic chemotherapy: Secondary | ICD-10-CM | POA: Insufficient documentation

## 2022-12-18 DIAGNOSIS — Z170 Estrogen receptor positive status [ER+]: Secondary | ICD-10-CM | POA: Insufficient documentation

## 2022-12-18 LAB — CBC WITH DIFFERENTIAL (CANCER CENTER ONLY)
Abs Immature Granulocytes: 0.03 10*3/uL (ref 0.00–0.07)
Basophils Absolute: 0.1 10*3/uL (ref 0.0–0.1)
Basophils Relative: 1 %
Eosinophils Absolute: 0.4 10*3/uL (ref 0.0–0.5)
Eosinophils Relative: 3 %
HCT: 41.5 % (ref 36.0–46.0)
Hemoglobin: 14.6 g/dL (ref 12.0–15.0)
Immature Granulocytes: 0 %
Lymphocytes Relative: 16 %
Lymphs Abs: 1.8 10*3/uL (ref 0.7–4.0)
MCH: 31.9 pg (ref 26.0–34.0)
MCHC: 35.2 g/dL (ref 30.0–36.0)
MCV: 90.6 fL (ref 80.0–100.0)
Monocytes Absolute: 0.7 10*3/uL (ref 0.1–1.0)
Monocytes Relative: 6 %
Neutro Abs: 7.8 10*3/uL — ABNORMAL HIGH (ref 1.7–7.7)
Neutrophils Relative %: 74 %
Platelet Count: 202 10*3/uL (ref 150–400)
RBC: 4.58 MIL/uL (ref 3.87–5.11)
RDW: 12.3 % (ref 11.5–15.5)
WBC Count: 10.7 10*3/uL — ABNORMAL HIGH (ref 4.0–10.5)
nRBC: 0 % (ref 0.0–0.2)

## 2022-12-18 LAB — CMP (CANCER CENTER ONLY)
ALT: 20 U/L (ref 0–44)
AST: 17 U/L (ref 15–41)
Albumin: 4.1 g/dL (ref 3.5–5.0)
Alkaline Phosphatase: 64 U/L (ref 38–126)
Anion gap: 8 (ref 5–15)
BUN: 12 mg/dL (ref 6–20)
CO2: 22 mmol/L (ref 22–32)
Calcium: 9.2 mg/dL (ref 8.9–10.3)
Chloride: 108 mmol/L (ref 98–111)
Creatinine: 1.07 mg/dL — ABNORMAL HIGH (ref 0.44–1.00)
GFR, Estimated: >60 mL/min (ref >60)
Glucose, Bld: 125 mg/dL — ABNORMAL HIGH (ref 70–99)
Potassium: 3.6 mmol/L (ref 3.5–5.1)
Sodium: 138 mmol/L (ref 135–145)
Total Bilirubin: 0.7 mg/dL (ref 0.3–1.2)
Total Protein: 7.2 g/dL (ref 6.5–8.1)

## 2022-12-18 MED ORDER — GOSERELIN ACETATE 10.8 MG ~~LOC~~ IMPL
10.8000 mg | DRUG_IMPLANT | Freq: Once | SUBCUTANEOUS | Status: AC
  Administered 2022-12-18: 10.8 mg via SUBCUTANEOUS
  Filled 2022-12-18: qty 10.8

## 2023-01-19 ENCOUNTER — Other Ambulatory Visit: Payer: Self-pay | Admitting: Hematology and Oncology

## 2023-01-23 ENCOUNTER — Encounter: Payer: Self-pay | Admitting: Hematology and Oncology

## 2023-03-20 ENCOUNTER — Inpatient Hospital Stay: Payer: 59

## 2023-03-20 ENCOUNTER — Inpatient Hospital Stay: Payer: 59 | Attending: Hematology and Oncology

## 2023-03-20 VITALS — BP 114/74 | HR 84 | Temp 98.5°F | Resp 16

## 2023-03-20 DIAGNOSIS — Z5111 Encounter for antineoplastic chemotherapy: Secondary | ICD-10-CM | POA: Diagnosis not present

## 2023-03-20 DIAGNOSIS — Z170 Estrogen receptor positive status [ER+]: Secondary | ICD-10-CM

## 2023-03-20 DIAGNOSIS — C50811 Malignant neoplasm of overlapping sites of right female breast: Secondary | ICD-10-CM | POA: Insufficient documentation

## 2023-03-20 MED ORDER — GOSERELIN ACETATE 10.8 MG ~~LOC~~ IMPL
10.8000 mg | DRUG_IMPLANT | Freq: Once | SUBCUTANEOUS | Status: AC
  Administered 2023-03-20: 10.8 mg via SUBCUTANEOUS
  Filled 2023-03-20: qty 10.8

## 2023-06-22 ENCOUNTER — Inpatient Hospital Stay: Payer: 59 | Attending: Hematology and Oncology

## 2023-06-22 DIAGNOSIS — Z5111 Encounter for antineoplastic chemotherapy: Secondary | ICD-10-CM | POA: Diagnosis not present

## 2023-06-22 DIAGNOSIS — Z170 Estrogen receptor positive status [ER+]: Secondary | ICD-10-CM | POA: Insufficient documentation

## 2023-06-22 DIAGNOSIS — C50811 Malignant neoplasm of overlapping sites of right female breast: Secondary | ICD-10-CM | POA: Insufficient documentation

## 2023-06-22 MED ORDER — GOSERELIN ACETATE 10.8 MG ~~LOC~~ IMPL
10.8000 mg | DRUG_IMPLANT | Freq: Once | SUBCUTANEOUS | Status: AC
  Administered 2023-06-22: 10.8 mg via SUBCUTANEOUS
  Filled 2023-06-22: qty 10.8

## 2023-06-22 NOTE — Patient Instructions (Signed)
Goserelin Implant What is this medication? GOSERELIN (GOE se rel in) treats prostate cancer and breast cancer. It works by decreasing levels of the hormones testosterone and estrogen in the body. This prevents prostate and breast cancer cells from spreading or growing. It may also be used to treat endometriosis. This is a condition where the tissue that lines the uterus grows outside the uterus. It works by decreasing the amount of estrogen your body makes, which reduces heavy bleeding and pain. It can also be used to help thin the lining of the uterus before a surgery used to prevent or reduce heavy periods. This medicine may be used for other purposes; ask your health care provider or pharmacist if you have questions. COMMON BRAND NAME(S): Zoladex, Zoladex 3-Month What should I tell my care team before I take this medication? They need to know if you have any of these conditions: Bone problems Diabetes Heart disease History of irregular heartbeat or rhythm An unusual or allergic reaction to goserelin, other medications, foods, dyes, or preservatives Pregnant or trying to get pregnant Breastfeeding How should I use this medication? This medication is injected under the skin. It is given by your care team in a hospital or clinic setting. Talk to your care team about the use of this medication in children. Special care may be needed. Overdosage: If you think you have taken too much of this medicine contact a poison control center or emergency room at once. NOTE: This medicine is only for you. Do not share this medicine with others. What if I miss a dose? Keep appointments for follow-up doses. It is important not to miss your dose. Call your care team if you are unable to keep an appointment. What may interact with this medication? Do not take this medication with any of the following: Cisapride Dronedarone Pimozide Thioridazine This medication may also interact with the following: Other  medications that cause heart rhythm changes This list may not describe all possible interactions. Give your health care provider a list of all the medicines, herbs, non-prescription drugs, or dietary supplements you use. Also tell them if you smoke, drink alcohol, or use illegal drugs. Some items may interact with your medicine. What should I watch for while using this medication? Visit your care team for regular checks on your progress. Your symptoms may appear to get worse during the first weeks of this therapy. Tell your care team if your symptoms do not start to get better or if they get worse after this time. Using this medication for a long time may weaken your bones. If you smoke or frequently drink alcohol you may increase your risk of bone loss. A family history of osteoporosis, chronic use of medications for seizures (convulsions), or corticosteroids can also increase your risk of bone loss. The risk of bone fractures may be increased. Talk to your care team about your bone health. This medication may increase blood sugar. The risk may be higher in patients who already have diabetes. Ask your care team what you can do to lower your risk of diabetes while taking this medication. This medication should stop regular monthly menstruation in women. Tell your care team if you continue to menstruate. Talk to your care team if you wish to become pregnant or think you might be pregnant. This medication can cause serious birth defects if taken during pregnancy or for 12 weeks after stopping treatment. Talk to your care team about reliable forms of contraception. Do not breastfeed while taking this   medication. This medication may cause infertility. Talk to your care team if you are concerned about your fertility. What side effects may I notice from receiving this medication? Side effects that you should report to your care team as soon as possible: Allergic reactions--skin rash, itching, hives, swelling  of the face, lips, tongue, or throat Change in the amount of urine Heart attack--pain or tightness in the chest, shoulders, arms, or jaw, nausea, shortness of breath, cold or clammy skin, feeling faint or lightheaded Heart rhythm changes--fast or irregular heartbeat, dizziness, feeling faint or lightheaded, chest pain, trouble breathing High blood sugar (hyperglycemia)--increased thirst or amount of urine, unusual weakness or fatigue, blurry vision High calcium level--increased thirst or amount of urine, nausea, vomiting, confusion, unusual weakness or fatigue, bone pain Pain, redness, irritation, or bruising at the injection site Severe back pain, numbness or weakness of the hands, arms, legs, or feet, loss of coordination, loss of bowel or bladder control Stroke--sudden numbness or weakness of the face, arm, or leg, trouble speaking, confusion, trouble walking, loss of balance or coordination, dizziness, severe headache, change in vision Swelling and pain of the tumor site or lymph nodes Trouble passing urine Side effects that usually do not require medical attention (report to your care team if they continue or are bothersome): Change in sex drive or performance Headache Hot flashes Rapid or extreme change in emotion or mood Sweating Swelling of the ankles, hands, or feet Unusual vaginal discharge, itching, or odor This list may not describe all possible side effects. Call your doctor for medical advice about side effects. You may report side effects to FDA at 1-800-FDA-1088. Where should I keep my medication? This medication is given in a hospital or clinic. It will not be stored at home. NOTE: This sheet is a summary. It may not cover all possible information. If you have questions about this medicine, talk to your doctor, pharmacist, or health care provider.  2024 Elsevier/Gold Standard (2021-11-24 00:00:00)  

## 2023-06-27 ENCOUNTER — Ambulatory Visit
Admission: RE | Admit: 2023-06-27 | Discharge: 2023-06-27 | Disposition: A | Discharge status: Home / Self Care | Payer: 59 | Source: Ambulatory Visit | Attending: Hematology and Oncology | Admitting: Hematology and Oncology

## 2023-06-27 DIAGNOSIS — Z170 Estrogen receptor positive status [ER+]: Secondary | ICD-10-CM

## 2023-09-21 ENCOUNTER — Inpatient Hospital Stay: Payer: 59 | Attending: Hematology and Oncology

## 2023-09-21 DIAGNOSIS — Z5111 Encounter for antineoplastic chemotherapy: Secondary | ICD-10-CM | POA: Insufficient documentation

## 2023-09-21 DIAGNOSIS — C50811 Malignant neoplasm of overlapping sites of right female breast: Secondary | ICD-10-CM | POA: Insufficient documentation

## 2023-09-24 ENCOUNTER — Telehealth: Payer: Self-pay | Admitting: Hematology and Oncology

## 2023-09-25 ENCOUNTER — Inpatient Hospital Stay

## 2023-09-25 VITALS — BP 122/88 | HR 83 | Temp 98.7°F | Resp 16

## 2023-09-25 DIAGNOSIS — Z5111 Encounter for antineoplastic chemotherapy: Secondary | ICD-10-CM | POA: Diagnosis not present

## 2023-09-25 DIAGNOSIS — Z170 Estrogen receptor positive status [ER+]: Secondary | ICD-10-CM

## 2023-09-25 DIAGNOSIS — C50811 Malignant neoplasm of overlapping sites of right female breast: Secondary | ICD-10-CM | POA: Diagnosis present

## 2023-09-25 MED ORDER — GOSERELIN ACETATE 10.8 MG ~~LOC~~ IMPL
10.8000 mg | DRUG_IMPLANT | Freq: Once | SUBCUTANEOUS | Status: AC
  Administered 2023-09-25: 10.8 mg via SUBCUTANEOUS
  Filled 2023-09-25: qty 10.8

## 2023-11-02 ENCOUNTER — Telehealth: Payer: Self-pay | Admitting: Hematology and Oncology

## 2023-11-02 NOTE — Telephone Encounter (Signed)
 Left vm about rescheduled appt date and time.

## 2023-11-30 ENCOUNTER — Inpatient Hospital Stay: Payer: 59 | Admitting: Hematology and Oncology

## 2023-11-30 ENCOUNTER — Inpatient Hospital Stay: Payer: 59

## 2023-12-07 ENCOUNTER — Other Ambulatory Visit: Payer: Self-pay | Admitting: *Deleted

## 2023-12-07 ENCOUNTER — Inpatient Hospital Stay (HOSPITAL_BASED_OUTPATIENT_CLINIC_OR_DEPARTMENT_OTHER): Admitting: Adult Health

## 2023-12-07 ENCOUNTER — Inpatient Hospital Stay: Attending: Hematology and Oncology

## 2023-12-07 VITALS — BP 130/83 | HR 78 | Temp 97.8°F | Resp 18 | Ht 65.0 in | Wt 232.8 lb

## 2023-12-07 DIAGNOSIS — Z8052 Family history of malignant neoplasm of bladder: Secondary | ICD-10-CM | POA: Insufficient documentation

## 2023-12-07 DIAGNOSIS — Z170 Estrogen receptor positive status [ER+]: Secondary | ICD-10-CM | POA: Diagnosis not present

## 2023-12-07 DIAGNOSIS — Z923 Personal history of irradiation: Secondary | ICD-10-CM | POA: Diagnosis not present

## 2023-12-07 DIAGNOSIS — Z801 Family history of malignant neoplasm of trachea, bronchus and lung: Secondary | ICD-10-CM | POA: Insufficient documentation

## 2023-12-07 DIAGNOSIS — R002 Palpitations: Secondary | ICD-10-CM | POA: Insufficient documentation

## 2023-12-07 DIAGNOSIS — J45909 Unspecified asthma, uncomplicated: Secondary | ICD-10-CM | POA: Insufficient documentation

## 2023-12-07 DIAGNOSIS — C50812 Malignant neoplasm of overlapping sites of left female breast: Secondary | ICD-10-CM | POA: Diagnosis present

## 2023-12-07 DIAGNOSIS — R232 Flushing: Secondary | ICD-10-CM | POA: Insufficient documentation

## 2023-12-07 DIAGNOSIS — Z800 Family history of malignant neoplasm of digestive organs: Secondary | ICD-10-CM | POA: Insufficient documentation

## 2023-12-07 DIAGNOSIS — R6882 Decreased libido: Secondary | ICD-10-CM | POA: Insufficient documentation

## 2023-12-07 DIAGNOSIS — K219 Gastro-esophageal reflux disease without esophagitis: Secondary | ICD-10-CM | POA: Diagnosis not present

## 2023-12-07 DIAGNOSIS — Z79811 Long term (current) use of aromatase inhibitors: Secondary | ICD-10-CM

## 2023-12-07 LAB — CMP (CANCER CENTER ONLY)
ALT: 20 U/L (ref 0–44)
AST: 17 U/L (ref 15–41)
Albumin: 4.1 g/dL (ref 3.5–5.0)
Alkaline Phosphatase: 61 U/L (ref 38–126)
Anion gap: 6 (ref 5–15)
BUN: 14 mg/dL (ref 6–20)
CO2: 25 mmol/L (ref 22–32)
Calcium: 9.2 mg/dL (ref 8.9–10.3)
Chloride: 108 mmol/L (ref 98–111)
Creatinine: 0.95 mg/dL (ref 0.44–1.00)
GFR, Estimated: >60 mL/min (ref >60)
Glucose, Bld: 95 mg/dL (ref 70–99)
Potassium: 4.1 mmol/L (ref 3.5–5.1)
Sodium: 139 mmol/L (ref 135–145)
Total Bilirubin: 0.6 mg/dL (ref 0.0–1.2)
Total Protein: 6.9 g/dL (ref 6.5–8.1)

## 2023-12-07 LAB — CBC WITH DIFFERENTIAL (CANCER CENTER ONLY)
Abs Immature Granulocytes: 0.03 10*3/uL (ref 0.00–0.07)
Basophils Absolute: 0 10*3/uL (ref 0.0–0.1)
Basophils Relative: 0 %
Eosinophils Absolute: 0.3 10*3/uL (ref 0.0–0.5)
Eosinophils Relative: 4 %
HCT: 40.9 % (ref 36.0–46.0)
Hemoglobin: 14.5 g/dL (ref 12.0–15.0)
Immature Granulocytes: 0 %
Lymphocytes Relative: 22 %
Lymphs Abs: 2.1 10*3/uL (ref 0.7–4.0)
MCH: 31.4 pg (ref 26.0–34.0)
MCHC: 35.5 g/dL (ref 30.0–36.0)
MCV: 88.5 fL (ref 80.0–100.0)
Monocytes Absolute: 0.7 10*3/uL (ref 0.1–1.0)
Monocytes Relative: 8 %
Neutro Abs: 6.2 10*3/uL (ref 1.7–7.7)
Neutrophils Relative %: 66 %
Platelet Count: 205 10*3/uL (ref 150–400)
RBC: 4.62 MIL/uL (ref 3.87–5.11)
RDW: 12.4 % (ref 11.5–15.5)
WBC Count: 9.4 10*3/uL (ref 4.0–10.5)
nRBC: 0 % (ref 0.0–0.2)

## 2023-12-11 ENCOUNTER — Encounter: Payer: Self-pay | Admitting: Adult Health

## 2023-12-11 ENCOUNTER — Encounter: Payer: Self-pay | Admitting: Hematology and Oncology

## 2023-12-11 NOTE — Progress Notes (Signed)
 [facility] Cancer Follow up:    Breanna Cora, PA-C [address] [place] [address]   DIAGNOSIS:  Cancer Staging  Malignant neoplasm of overlapping sites of left breast in female, estrogen receptor positive (HCC) Staging form: Breast, AJCC 8th Edition - Clinical: Stage IA (cT1c, cN0, cM0, G2, ER+, PR+, HER2-) - Signed by Colie Dawes, MD on 05/27/2018 Histologic grading system: 3 grade system Laterality: Left - Pathologic: Stage IA (pT2, pN1a, cM0, G1, ER+, PR+, HER2-) - Signed by Colie Dawes, MD on 07/24/2018 Histologic grading system: 3 grade system    SUMMARY OF ONCOLOGIC HISTORY: Oncology History  Malignant neoplasm of overlapping sites of left breast in female, estrogen receptor positive (HCC)  05/07/2018 Initial Diagnosis    [place]  woman, status post left breast biopsy x2 on 05/07/2018, showing (a) left breast upper inner quadrant 2 cm from the nipple: a clinical T1b N0, stage IA invasive ductal carcinoma, grade 1, estrogen and progesterone receptor strongly positive, HER-2 not amplified, with an MIB-1 of 10%; strongly E-cadherin positive (b) left breast upper inner quadrant 3 cm from the nipple, a clinical T1c N0, stage IA invasive ductal carcinoma, grade 2, estrogen receptor positive with moderate staining intensity, progesterone receptor positive with strong staining intensity, with an MIB-1 of 10%, and no HER-2 amplification; faint but positive E-cadherin stain   05/27/2018 Cancer Staging   Staging form: Breast, AJCC 8th Edition - Clinical: Stage IA (cT1c, cN0, cM0, G2, ER+, PR+, HER2-) - Signed by Colie Dawes, MD on 05/27/2018   05/28/2018 Genetic Testing   through the Multi-Cancer Panel offered by [facility] showed no deleterious mutations in AIP, ALK, APC, ATM, AXIN2, BAP1, BARD1, BLM, BMPR1A, BRCA1, BRCA2, BRIP1, BUB1B, CASR, CDC73, CDH1, CDK4, CDKN1B, CDKN1C, CDKN2A, CEBPA, CEP57, CHEK2, CTNNA1, DICER1, DIS3L2, EGFR,  ENG, EPCAM, FH, FLCN, GALNT12, GATA2, GPC3, GREM1, HOXB13, HRAS, KIT, MAX, MEN1, MET, MITF, MLH1, MLH3, MSH2, MSH3, MSH6, MUTYH, NBN, NF1, NF2, NTHL1, PALB2, PDGFRA, PHOX2B, PMS2, POLD1, POLE, POT1, PRKAR1A, PTCH1, PTEN, RAD50, RAD51C, RAD51D, RB1, RECQL4, RET, RNF43, RPS20, RUNX1, SDHA, SDHAF2, SDHB, SDHC, SDHD, SMAD4, SMARCA4, SMARCB1, SMARCE1, STK11, SUFU, TERC, TERT, TMEM127, TP53, TSC1, TSC2, VHL, WRN, WT1     06/24/2018 Surgery   status post left lumpectomy and sentinel lymph node sampling for a pT2 pN1, stage IIA invasive ductal carcinoma, grade 1, with negative margins             (a) a total of 2 sentinel lymph nodes were removed   06/24/2018 Miscellaneous   MammaPrint read as low risk, with an average 10-year risk of recurrence with no treatment of 10%, the 5-year disease-free survival being 97.8% with hormone treatment alone   07/24/2018 Cancer Staging   Staging form: Breast, AJCC 8th Edition - Pathologic: Stage IA (pT2, pN1a, cM0, G1, ER+, PR+, HER2-) - Signed by Colie Dawes, MD on 07/24/2018   07/31/2018 - 09/17/2018 Radiation Therapy   adjuvant radiation 07/31/2018 - 09/17/2018             (a) Left Breast and IM nodes/ 50.4 Gy in 28 fractions of 1.8 Gy             (b) Left post axilla/ supraclavicular / 50.4 Gy in 28 fractions of 1.8 Gy             (c) Boost / 10 Gy in 5 fractions of 2 Gy     09/2018 -  Anti-estrogen oral therapy   Anastrozole  and Goserelin  CURRENT THERAPY: Anastrozole  Zoladex   INTERVAL HISTORY:  Discussed the use of AI scribe software for clinical note transcription with the patient, who gave verbal consent to proceed.  History of Present Illness Breanna [name] is a 43 year old female with breast cancer who presents for a follow-up visit.  She is on anastrozole  and Zoladex  10.8 mg every three months without issues. Hot flashes are present but less severe than before. She discontinued gabapentin  due to concerns about memory and mental clarity, with  no significant change in hot flash frequency. Persistent vaginal dryness and decreased libido are noted, with no current solutions beyond lubricants. She has no family history of breast cancer and completed genetic testing in 2019 with no significant findings. Her last mammogram in December showed breast density category A.     Patient Active Problem List   Diagnosis Date Noted   History of breast cancer 08/04/2021   Encounter for screening fecal occult blood testing 08/04/2021   Encounter for gynecological examination with Papanicolaou smear of cervix 08/04/2021   Morbid obesity with body mass index (BMI) of 40.0 to 44.9 in adult [facility]) 12/18/2019   Genetic testing 05/28/2018   Malignant neoplasm of overlapping sites of left breast in female, estrogen receptor positive (HCC) 05/22/2018   Family history of kidney cancer    Family history of bladder cancer    Family history of stomach cancer    Family history of lung cancer    Esophageal reflux    GERD (gastroesophageal reflux disease) 09/29/2015   Rectal bleeding 09/29/2015   Dysphagia 09/29/2015   Insertion of Nexplanon 06/16/2015   Pain of both breasts 05/21/2014   Breast nodule 05/21/2014   Anxiety 05/21/2014   Pseudotumor cerebri syndrome 08/22/2013    is allergic to adhesive [tape], latex, topamax [topiramate], celecoxib , codeine, penicillins, and sulfa antibiotics.  MEDICAL HISTORY: Past Medical History:  Diagnosis Date   Anxiety    Arthritis    knees   Asthma    Breast cancer (HCC) 05/2018   left    Cluster headaches    Cough 06/11/2018   Dental crown present    Family history of bladder cancer    Family history of kidney cancer    Family history of lung cancer    Family history of stomach cancer    GERD (gastroesophageal reflux disease)    Heart palpitations    occasional - no cardiologist   History of asthma    as a child - prn inhaler   History of radiation therapy 07/31/18- 09/17/18   Left Breast / 50.4 Gy  in 28 fractions of 1.8 Gy, Left Supraclavicular / 50.4 Gy in 28 fractions of 1.8 Gy, and boost of 10 Gy in 5 fractions of 2 Gy each.    Obesity    Personal history of radiation therapy 07/2018   Pseudotumor cerebri syndrome    Seasonal allergies    Stuffy nose 06/11/2018    SURGICAL HISTORY: Past Surgical History:  Procedure Laterality Date   BIOPSY  11/08/2015   Procedure: BIOPSY;  Surgeon: Alyce Jubilee, MD;  [facility];  Service: Endoscopy;;  Gastric bx and Gastric polyp bx   BREAST BIOPSY Left 05/07/2018   x2   BREAST LUMPECTOMY Left 06/24/2018   BREAST LUMPECTOMY WITH RADIOACTIVE SEED AND SENTINEL LYMPH NODE BIOPSY Left 06/24/2018   Procedure: LEFT BREAST LUMPECTOMY WITH  BRACKETED RADIOACTIVE SEED AND LEFT AXILLARY DEEP SENTINEL LYMPH NODE BIOPSY AND BLUE DYE INJECTION;  Surgeon: Boyce Byes,  MD;  Location: [facility];  Service: General;  Laterality: Left;   CESAREAN SECTION  11/03/2003   CESAREAN SECTION  05/15/2006   CHOLECYSTECTOMY     COLONOSCOPY N/A 11/08/2015   Procedure: COLONOSCOPY;  Surgeon: Alyce Jubilee, MD;  [facility]: [facility];  Service: Endoscopy;  Laterality: N/A;  1000   ESOPHAGOGASTRODUODENOSCOPY N/A 11/08/2015   Procedure: ESOPHAGOGASTRODUODENOSCOPY (EGD);  Surgeon: Alyce Jubilee, MD;  [facility]: [facility];  Service: Endoscopy;  Laterality: N/A;   HEMORRHOID BANDING N/A 11/08/2015   Procedure: HEMORRHOID BANDING;  Surgeon: Alyce Jubilee, MD;  [facility];  Service: Endoscopy;  Laterality: N/A;   SAVORY DILATION N/A 11/08/2015   Procedure: SAVORY DILATION;  Surgeon: Alyce Jubilee, MD;  [facility];  Service: Endoscopy;  Laterality: N/A;   WISDOM TOOTH EXTRACTION      SOCIAL HISTORY: Social History   Socioeconomic History   Marital status: Married    Spouse name: Donnie   Number of children: 2   Years of education: 14   Highest education level: Not on file  Occupational History    Comment:  LPN, Engineer, technical sales  Tobacco Use   Smoking status: Never   Smokeless tobacco: Never  Vaping Use   Vaping status: Never Used  Substance and Sexual Activity   Alcohol use: No   Drug use: No   Sexual activity: Yes    Birth control/protection: None  Other Topics Concern   Not on file  Social History Narrative   Not on file   Social Drivers of Health   Financial Resource Strain: Low Risk  (08/04/2021)   Overall Financial Resource Strain (CARDIA)    Difficulty of Paying Living Expenses: Not very hard  Food Insecurity: No Food Insecurity (08/04/2021)   Hunger Vital Sign    Worried About Running Out of Food in the Last Year: Never true    Ran Out of Food in the Last Year: Never true  Transportation Needs: No Transportation Needs (08/04/2021)   PRAPARE - Administrator, Civil Service (Medical): No    Lack of Transportation (Non-Medical): No  Physical Activity: Insufficiently Active (08/04/2021)   Exercise Vital Sign    Days of Exercise per Week: 2 days    Minutes of Exercise per Session: 20 min  Stress: Stress Concern Present (08/04/2021)   [employer] of Occupational Health - Occupational Stress Questionnaire    Feeling of Stress : To some extent  Social Connections: Socially Integrated (08/04/2021)   Social Connection and Isolation Panel [NHANES]    Frequency of Communication with Friends and Family: More than three times a week    Frequency of Social Gatherings with Friends and Family: Twice a week    Attends Religious Services: More than 4 times per year    Active Member of [employer] or Organizations: Yes    Attends Banker Meetings: 1 to 4 times per year    Marital Status: Married  Catering manager Violence: Not At Risk (08/04/2021)   Humiliation, Afraid, Rape, and Kick questionnaire    Fear of Current or Ex-Partner: No    Emotionally Abused: No    Physically Abused: No    Sexually Abused: No    FAMILY HISTORY: Family History  Problem  Relation Age of Onset   COPD Mother    Pneumonia Mother    Hyperlipidemia Father    Kidney cancer Father 74       "simple renal cell  carcinoma"   Heart disease Maternal Aunt    Bladder Cancer Maternal Uncle        hx smoking   Hypertension Maternal Grandmother    Diabetes Maternal Grandmother    Stroke Paternal Grandmother    Hypertension Paternal Grandmother    Diabetes Paternal Grandmother    Stomach cancer Paternal Grandmother 58       had a portion of stomah removed   Diabetes Paternal Grandfather    Heart disease Paternal Grandfather    Hyperlipidemia Paternal Grandfather    Hypertension Paternal Grandfather    Lung cancer Other    Breast cancer Neg Hx     Review of Systems  Constitutional:  Negative for appetite change, chills, fatigue, fever and unexpected weight change.  HENT:   Negative for hearing loss, lump/mass and trouble swallowing.   Eyes:  Negative for eye problems and icterus.  Respiratory:  Negative for chest tightness, cough and shortness of breath.   Cardiovascular:  Negative for chest pain, leg swelling and palpitations.  Gastrointestinal:  Negative for abdominal distention, abdominal pain, constipation, diarrhea, nausea and vomiting.  Endocrine: Positive for hot flashes.  Genitourinary:  Negative for difficulty urinating.   Musculoskeletal:  Negative for arthralgias.  Skin:  Negative for itching and rash.  Neurological:  Negative for dizziness, extremity weakness, headaches and numbness.  Hematological:  Negative for adenopathy. Does not bruise/bleed easily.  Psychiatric/Behavioral:  Negative for depression. The patient is not nervous/anxious.       PHYSICAL EXAMINATION    Vitals:   12/07/23 1436  BP: 130/83  Pulse: 78  Resp: 18  Temp: 97.8 F (36.6 C)  SpO2: 100%    Physical Exam Constitutional:      General: She is not in acute distress.    Appearance: Normal appearance. She is not toxic-appearing.  HENT:     Head: Normocephalic and  atraumatic.     Mouth/Throat:     Mouth: Mucous membranes are moist.     Pharynx: Oropharynx is clear. No oropharyngeal exudate or posterior oropharyngeal erythema.  Eyes:     General: No scleral icterus. Cardiovascular:     Rate and Rhythm: Normal rate and regular rhythm.     Pulses: Normal pulses.     Heart sounds: Normal heart sounds.  Pulmonary:     Effort: Pulmonary effort is normal.     Breath sounds: Normal breath sounds.  Chest:     Comments: Left breast s/p lumpectomy and radiation, no sign of local recurrence, right breast benign Abdominal:     General: Abdomen is flat. Bowel sounds are normal. There is no distension.     Palpations: Abdomen is soft.     Tenderness: There is no abdominal tenderness.  Musculoskeletal:        General: No swelling.     Cervical back: Neck supple.  Lymphadenopathy:     Cervical: No cervical adenopathy.     Upper Body:     Right upper body: No supraclavicular or axillary adenopathy.     Left upper body: No supraclavicular or axillary adenopathy.  Skin:    General: Skin is warm and dry.     Findings: No rash.  Neurological:     General: No focal deficit present.     Mental Status: She is alert.  Psychiatric:        Mood and Affect: Mood normal.        Behavior: Behavior normal.     LABORATORY DATA:  CBC  Component Value Date/Time   WBC 9.4 12/07/2023 1412   WBC 7.6 12/01/2022 1935   RBC 4.62 12/07/2023 1412   HGB 14.5 12/07/2023 1412   HCT 40.9 12/07/2023 1412   PLT 205 12/07/2023 1412   MCV 88.5 12/07/2023 1412   MCH 31.4 12/07/2023 1412   MCHC 35.5 12/07/2023 1412   RDW 12.4 12/07/2023 1412   LYMPHSABS 2.1 12/07/2023 1412   MONOABS 0.7 12/07/2023 1412   EOSABS 0.3 12/07/2023 1412   BASOSABS 0.0 12/07/2023 1412    CMP     Component Value Date/Time   NA 139 12/07/2023 1412   K 4.1 12/07/2023 1412   CL 108 12/07/2023 1412   CO2 25 12/07/2023 1412   GLUCOSE 95 12/07/2023 1412   BUN 14 12/07/2023 1412    CREATININE 0.95 12/07/2023 1412   CREATININE 1.08 08/22/2013 1039   CALCIUM 9.2 12/07/2023 1412   PROT 6.9 12/07/2023 1412   ALBUMIN 4.1 12/07/2023 1412   AST 17 12/07/2023 1412   ALT 20 12/07/2023 1412   ALKPHOS 61 12/07/2023 1412   BILITOT 0.6 12/07/2023 1412   GFRNONAA >60 12/07/2023 1412   GFRAA 57 (L) 12/18/2019 1410   GFRAA >60 05/22/2018 1158     ASSESSMENT and THERAPY PLAN:   Malignant neoplasm of overlapping sites of left breast in female, estrogen receptor positive (HCC) Assessment and Plan Assessment & Plan Breast cancer, in remission Breast cancer remains in remission with no signs of recurrence. Discussed Gardant Reveal blood test for early detection of recurrence. She prefers to wait for mammogram changes before considering the test. - Continue anastrozole  therapy. - Continue Zoladex  injections every 12 weeks--see Dr. Arno Bibles in approximately 1 year - Schedule bone density testing in December 2025. - Revisit Gardant Reveal test discussion if interest arises.  Vaginal dryness Persistent vaginal dryness likely secondary to breast cancer treatment. Discussed non-hormonal management options. - Use vitamin E suppositories as needed. - Use coconut oil as a lubricant and moisturizer.  Hot flashes Intermittent hot flashes manageable without medication.  Decreased libido Decreased libido likely related to breast cancer treatment and hormonal changes. She expressed frustration but understands the situation.  Palpitations Intermittent palpitations possibly related to caffeine intake and stress. Previously managed with a heart monitor. Advised to monitor symptoms and consider cardiology referral if symptoms persist. - Monitor palpitations and reduce caffeine intake. - Consider cardiology referral if symptoms persist.    All questions were answered. The patient knows to call the clinic with any problems, questions or concerns. We can certainly see the patient much sooner  if necessary.  Total encounter time:20 minutes*in face-to-face visit time, chart review, lab review, care coordination, order entry, and documentation of the encounter time.    Alwin Baars, NP 12/11/23 1:08 PM Medical Oncology and Hematology [facility] [address] [place], [place] [address] Tel. [phone]    Fax. [phone]  *Total Encounter Time as defined by the Centers for Medicare and Medicaid Services includes, in addition to the face-to-face time of a patient visit (documented in the note above) non-face-to-face time: obtaining and reviewing outside history, ordering and reviewing medications, tests or procedures, care coordination (communications with other health care professionals or caregivers) and documentation in the medical record.

## 2023-12-11 NOTE — Assessment & Plan Note (Signed)
 Assessment and Plan Assessment & Plan Breast cancer, in remission Breast cancer remains in remission with no signs of recurrence. Discussed Gardant Reveal blood test for early detection of recurrence. She prefers to wait for mammogram changes before considering the test. - Continue anastrozole  therapy. - Continue Zoladex  injections every 12 weeks--see Dr. Arno Bibles in approximately 1 year - Schedule bone density testing in December 2025. - Revisit Gardant Reveal test discussion if interest arises.  Vaginal dryness Persistent vaginal dryness likely secondary to breast cancer treatment. Discussed non-hormonal management options. - Use vitamin E suppositories as needed. - Use coconut oil as a lubricant and moisturizer.  Hot flashes Intermittent hot flashes manageable without medication.  Decreased libido Decreased libido likely related to breast cancer treatment and hormonal changes. She expressed frustration but understands the situation.  Palpitations Intermittent palpitations possibly related to caffeine intake and stress. Previously managed with a heart monitor. Advised to monitor symptoms and consider cardiology referral if symptoms persist. - Monitor palpitations and reduce caffeine intake. - Consider cardiology referral if symptoms persist.

## 2023-12-21 ENCOUNTER — Inpatient Hospital Stay: Payer: 59 | Attending: Hematology and Oncology

## 2023-12-21 VITALS — BP 117/76 | HR 80 | Temp 98.4°F | Resp 16

## 2023-12-21 DIAGNOSIS — Z170 Estrogen receptor positive status [ER+]: Secondary | ICD-10-CM

## 2023-12-21 DIAGNOSIS — C50811 Malignant neoplasm of overlapping sites of right female breast: Secondary | ICD-10-CM | POA: Diagnosis present

## 2023-12-21 MED ORDER — GOSERELIN ACETATE 10.8 MG ~~LOC~~ IMPL
10.8000 mg | DRUG_IMPLANT | Freq: Once | SUBCUTANEOUS | Status: AC
  Administered 2023-12-21: 10.8 mg via SUBCUTANEOUS
  Filled 2023-12-21: qty 10.8

## 2024-03-07 ENCOUNTER — Inpatient Hospital Stay: Attending: Hematology and Oncology

## 2024-03-07 VITALS — BP 110/78 | HR 72 | Temp 98.8°F | Resp 18

## 2024-03-07 DIAGNOSIS — Z79811 Long term (current) use of aromatase inhibitors: Secondary | ICD-10-CM | POA: Insufficient documentation

## 2024-03-07 DIAGNOSIS — Z1732 Human epidermal growth factor receptor 2 negative status: Secondary | ICD-10-CM | POA: Insufficient documentation

## 2024-03-07 DIAGNOSIS — Z170 Estrogen receptor positive status [ER+]: Secondary | ICD-10-CM | POA: Insufficient documentation

## 2024-03-07 DIAGNOSIS — C50811 Malignant neoplasm of overlapping sites of right female breast: Secondary | ICD-10-CM | POA: Insufficient documentation

## 2024-03-07 DIAGNOSIS — Z5111 Encounter for antineoplastic chemotherapy: Secondary | ICD-10-CM | POA: Diagnosis present

## 2024-03-07 DIAGNOSIS — Z1721 Progesterone receptor positive status: Secondary | ICD-10-CM | POA: Insufficient documentation

## 2024-03-07 MED ORDER — GOSERELIN ACETATE 10.8 MG ~~LOC~~ IMPL
10.8000 mg | DRUG_IMPLANT | Freq: Once | SUBCUTANEOUS | Status: AC
  Administered 2024-03-07: 10.8 mg via SUBCUTANEOUS
  Filled 2024-03-07: qty 10.8

## 2024-03-18 ENCOUNTER — Other Ambulatory Visit: Payer: Self-pay | Admitting: Adult Health

## 2024-03-18 DIAGNOSIS — Z79811 Long term (current) use of aromatase inhibitors: Secondary | ICD-10-CM

## 2024-03-18 DIAGNOSIS — Z170 Estrogen receptor positive status [ER+]: Secondary | ICD-10-CM

## 2024-05-30 ENCOUNTER — Inpatient Hospital Stay

## 2024-05-30 ENCOUNTER — Inpatient Hospital Stay: Attending: Hematology and Oncology

## 2024-05-30 VITALS — BP 115/67 | HR 79 | Temp 98.8°F | Resp 18

## 2024-05-30 DIAGNOSIS — C50812 Malignant neoplasm of overlapping sites of left female breast: Secondary | ICD-10-CM

## 2024-05-30 MED ORDER — GOSERELIN ACETATE 10.8 MG ~~LOC~~ IMPL
10.8000 mg | DRUG_IMPLANT | Freq: Once | SUBCUTANEOUS | Status: AC
  Administered 2024-05-30: 10.8 mg via SUBCUTANEOUS
  Filled 2024-05-30: qty 10.8

## 2024-06-04 ENCOUNTER — Other Ambulatory Visit: Payer: Self-pay | Admitting: Hematology and Oncology

## 2024-06-04 DIAGNOSIS — Z1231 Encounter for screening mammogram for malignant neoplasm of breast: Secondary | ICD-10-CM

## 2024-06-25 ENCOUNTER — Ambulatory Visit (HOSPITAL_COMMUNITY)
Admission: RE | Admit: 2024-06-25 | Discharge: 2024-06-25 | Disposition: A | Discharge status: Home / Self Care | Source: Ambulatory Visit | Attending: Adult Health | Admitting: Adult Health

## 2024-06-25 ENCOUNTER — Ambulatory Visit: Payer: Self-pay

## 2024-06-25 DIAGNOSIS — Z79811 Long term (current) use of aromatase inhibitors: Secondary | ICD-10-CM | POA: Diagnosis not present

## 2024-06-25 DIAGNOSIS — Z13820 Encounter for screening for osteoporosis: Secondary | ICD-10-CM | POA: Diagnosis present

## 2024-06-25 DIAGNOSIS — Z780 Asymptomatic menopausal state: Secondary | ICD-10-CM | POA: Diagnosis not present

## 2024-06-25 NOTE — Telephone Encounter (Signed)
-----   Message from Morna JAYSON Kendall sent at 06/25/2024  9:35 AM EST ----- Bone denisty is normal.  Please call and give patient good news.  Thanks, Morna ----- Message ----- From: Rebecka, Rad Results In Sent: 06/25/2024   8:55 AM EST To: Morna Dalton Kendall, NP

## 2024-07-04 ENCOUNTER — Inpatient Hospital Stay: Admission: RE | Admit: 2024-07-04 | Discharge: 2024-07-04 | Attending: Hematology and Oncology

## 2024-07-04 DIAGNOSIS — Z1231 Encounter for screening mammogram for malignant neoplasm of breast: Secondary | ICD-10-CM

## 2024-07-08 ENCOUNTER — Other Ambulatory Visit: Payer: Self-pay | Admitting: Hematology and Oncology

## 2024-07-08 ENCOUNTER — Telehealth: Payer: Self-pay

## 2024-07-08 DIAGNOSIS — N6320 Unspecified lump in the left breast, unspecified quadrant: Secondary | ICD-10-CM

## 2024-07-08 NOTE — Telephone Encounter (Signed)
 Pt called with concerns about a new lump agt her left breast x 3 weeks. Order faxed to the breast center for diagnostic mm and u/s. Pt is aware and verbalized thanks.

## 2024-07-11 ENCOUNTER — Telehealth (HOSPITAL_COMMUNITY): Payer: Self-pay

## 2024-07-11 NOTE — Telephone Encounter (Signed)
 Pt called in needing a sooner appt to the breast center. Email was sent to the breast center per Rn. Appt was changed and pt was notified with new time and date. Pt verbalized understanding.

## 2024-07-22 ENCOUNTER — Ambulatory Visit
Admission: RE | Admit: 2024-07-22 | Discharge: 2024-07-22 | Disposition: A | Discharge status: Home / Self Care | Source: Ambulatory Visit | Attending: Hematology and Oncology | Admitting: Hematology and Oncology

## 2024-07-22 DIAGNOSIS — N6320 Unspecified lump in the left breast, unspecified quadrant: Secondary | ICD-10-CM

## 2024-07-24 ENCOUNTER — Ambulatory Visit
Admission: RE | Admit: 2024-07-24 | Discharge: 2024-07-24 | Disposition: A | Discharge status: Home / Self Care | Source: Ambulatory Visit | Attending: Hematology and Oncology | Admitting: Hematology and Oncology

## 2024-07-24 DIAGNOSIS — N6320 Unspecified lump in the left breast, unspecified quadrant: Secondary | ICD-10-CM

## 2024-07-24 HISTORY — PX: BREAST BIOPSY: SHX20

## 2024-07-25 LAB — SURGICAL PATHOLOGY

## 2024-07-30 ENCOUNTER — Ambulatory Visit: Payer: Self-pay | Admitting: Hematology and Oncology

## 2024-08-22 ENCOUNTER — Inpatient Hospital Stay: Attending: Hematology and Oncology

## 2024-08-22 VITALS — BP 122/83 | HR 103 | Temp 98.7°F | Resp 17

## 2024-08-22 DIAGNOSIS — Z170 Estrogen receptor positive status [ER+]: Secondary | ICD-10-CM

## 2024-08-22 MED ORDER — GOSERELIN ACETATE 10.8 MG ~~LOC~~ IMPL
10.8000 mg | DRUG_IMPLANT | Freq: Once | SUBCUTANEOUS | Status: AC
  Administered 2024-08-22: 10.8 mg via SUBCUTANEOUS
  Filled 2024-08-22: qty 10.8

## 2024-11-14 ENCOUNTER — Ambulatory Visit

## 2024-11-14 ENCOUNTER — Ambulatory Visit: Admitting: Hematology and Oncology
